# Patient Record
Sex: Male | Born: 1980 | Hispanic: Yes | Marital: Married | Smoking: Never smoker
Health system: Southern US, Community
[De-identification: ages and names within clinical notes are randomized; demographics above are authoritative.]

## PROBLEM LIST (undated history)

## (undated) DIAGNOSIS — R011 Cardiac murmur, unspecified: Secondary | ICD-10-CM

## (undated) DIAGNOSIS — R569 Unspecified convulsions: Secondary | ICD-10-CM

## (undated) DIAGNOSIS — F1010 Alcohol abuse, uncomplicated: Secondary | ICD-10-CM

## (undated) DIAGNOSIS — K7030 Alcoholic cirrhosis of liver without ascites: Secondary | ICD-10-CM

## (undated) HISTORY — PX: HAND SURGERY: SHX662

---

## 2008-01-06 ENCOUNTER — Ambulatory Visit: Payer: Self-pay | Admitting: Family Medicine

## 2008-01-06 ENCOUNTER — Ambulatory Visit: Payer: Self-pay

## 2013-07-05 ENCOUNTER — Emergency Department: Payer: Self-pay | Admitting: Emergency Medicine

## 2013-07-05 LAB — COMPREHENSIVE METABOLIC PANEL
ALK PHOS: 110 U/L
Albumin: 4 g/dL (ref 3.4–5.0)
Anion Gap: 6 — ABNORMAL LOW (ref 7–16)
BUN: 5 mg/dL — ABNORMAL LOW (ref 7–18)
Bilirubin,Total: 0.4 mg/dL (ref 0.2–1.0)
Calcium, Total: 9 mg/dL (ref 8.5–10.1)
Chloride: 109 mmol/L — ABNORMAL HIGH (ref 98–107)
Co2: 27 mmol/L (ref 21–32)
Creatinine: 0.6 mg/dL (ref 0.60–1.30)
EGFR (African American): >60
GLUCOSE: 87 mg/dL (ref 65–99)
Osmolality: 280 (ref 275–301)
POTASSIUM: 3.5 mmol/L (ref 3.5–5.1)
SGOT(AST): 120 U/L — ABNORMAL HIGH (ref 15–37)
SGPT (ALT): 96 U/L — ABNORMAL HIGH (ref 12–78)
SODIUM: 142 mmol/L (ref 136–145)
Total Protein: 8.5 g/dL — ABNORMAL HIGH (ref 6.4–8.2)

## 2013-07-05 LAB — PROTIME-INR
INR: 1
PROTHROMBIN TIME: 13.5 s (ref 11.5–14.7)

## 2013-07-05 LAB — CBC WITH DIFFERENTIAL/PLATELET
BASOS PCT: 0.8 %
Basophil #: 0 10*3/uL (ref 0.0–0.1)
EOS ABS: 0 10*3/uL (ref 0.0–0.7)
Eosinophil %: 0.5 %
HCT: 49 % (ref 40.0–52.0)
HGB: 16.2 g/dL (ref 13.0–18.0)
LYMPHS ABS: 1.8 10*3/uL (ref 1.0–3.6)
LYMPHS PCT: 32.3 %
MCH: 31.1 pg (ref 26.0–34.0)
MCHC: 33.1 g/dL (ref 32.0–36.0)
MCV: 94 fL (ref 80–100)
MONO ABS: 0.7 x10 3/mm (ref 0.2–1.0)
Monocyte %: 12.9 %
Neutrophil #: 2.9 10*3/uL (ref 1.4–6.5)
Neutrophil %: 53.5 %
Platelet: 122 10*3/uL — ABNORMAL LOW (ref 150–440)
RBC: 5.21 10*6/uL (ref 4.40–5.90)
RDW: 13.5 % (ref 11.5–14.5)
WBC: 5.5 10*3/uL (ref 3.8–10.6)

## 2013-07-05 LAB — LIPASE, BLOOD: LIPASE: 206 U/L (ref 73–393)

## 2013-07-06 LAB — URINALYSIS, COMPLETE
Bacteria: NONE SEEN
Bilirubin,UR: NEGATIVE
Blood: NEGATIVE
GLUCOSE, UR: NEGATIVE mg/dL (ref 0–75)
Ketone: NEGATIVE
Leukocyte Esterase: NEGATIVE
Nitrite: NEGATIVE
Ph: 6 (ref 4.5–8.0)
Protein: NEGATIVE
RBC,UR: 1 /HPF (ref 0–5)
Specific Gravity: 1.025 (ref 1.003–1.030)
Squamous Epithelial: NONE SEEN
WBC UR: 1 /HPF (ref 0–5)

## 2013-07-06 LAB — CBC
HCT: 44.2 % (ref 40.0–52.0)
HGB: 14.6 g/dL (ref 13.0–18.0)
MCH: 31 pg (ref 26.0–34.0)
MCHC: 33 g/dL (ref 32.0–36.0)
MCV: 94 fL (ref 80–100)
Platelet: 104 10*3/uL — ABNORMAL LOW (ref 150–440)
RBC: 4.71 10*6/uL (ref 4.40–5.90)
RDW: 13.8 % (ref 11.5–14.5)
WBC: 5.1 10*3/uL (ref 3.8–10.6)

## 2013-07-06 LAB — ETHANOL
Ethanol %: 0.248 % — ABNORMAL HIGH (ref 0.000–0.080)
Ethanol: 248 mg/dL

## 2013-11-11 ENCOUNTER — Emergency Department: Payer: Self-pay | Admitting: Emergency Medicine

## 2013-11-11 LAB — COMPREHENSIVE METABOLIC PANEL
ALBUMIN: 3.7 g/dL (ref 3.4–5.0)
ALK PHOS: 97 U/L
AST: 208 U/L — AB (ref 15–37)
Anion Gap: 8 (ref 7–16)
BILIRUBIN TOTAL: 0.4 mg/dL (ref 0.2–1.0)
BUN: 4 mg/dL — ABNORMAL LOW (ref 7–18)
Calcium, Total: 8.2 mg/dL — ABNORMAL LOW (ref 8.5–10.1)
Chloride: 111 mmol/L — ABNORMAL HIGH (ref 98–107)
Co2: 26 mmol/L (ref 21–32)
Creatinine: 0.55 mg/dL — ABNORMAL LOW (ref 0.60–1.30)
EGFR (African American): >60
EGFR (Non-African Amer.): >60
Glucose: 96 mg/dL (ref 65–99)
Osmolality: 285 (ref 275–301)
Potassium: 3.5 mmol/L (ref 3.5–5.1)
SGPT (ALT): 121 U/L — ABNORMAL HIGH
Sodium: 145 mmol/L (ref 136–145)
Total Protein: 8.1 g/dL (ref 6.4–8.2)

## 2013-11-11 LAB — URINALYSIS, COMPLETE
BILIRUBIN, UR: NEGATIVE
Bacteria: NONE SEEN
Blood: NEGATIVE
Glucose,UR: NEGATIVE mg/dL (ref 0–75)
KETONE: NEGATIVE
LEUKOCYTE ESTERASE: NEGATIVE
Nitrite: NEGATIVE
Ph: 7 (ref 4.5–8.0)
Protein: NEGATIVE
RBC,UR: NONE SEEN /HPF (ref 0–5)
Specific Gravity: 1.001 (ref 1.003–1.030)
Squamous Epithelial: NONE SEEN
WBC UR: NONE SEEN /HPF (ref 0–5)

## 2013-11-11 LAB — CBC
HCT: 46.2 % (ref 40.0–52.0)
HGB: 15.2 g/dL (ref 13.0–18.0)
MCH: 31.5 pg (ref 26.0–34.0)
MCHC: 32.8 g/dL (ref 32.0–36.0)
MCV: 96 fL (ref 80–100)
Platelet: 141 10*3/uL — ABNORMAL LOW (ref 150–440)
RBC: 4.81 10*6/uL (ref 4.40–5.90)
RDW: 13.6 % (ref 11.5–14.5)
WBC: 5.5 10*3/uL (ref 3.8–10.6)

## 2013-11-11 LAB — DRUG SCREEN, URINE
Amphetamines, Ur Screen: NEGATIVE (ref ?–1000)
BENZODIAZEPINE, UR SCRN: NEGATIVE (ref ?–200)
Barbiturates, Ur Screen: NEGATIVE (ref ?–200)
CANNABINOID 50 NG, UR ~~LOC~~: NEGATIVE (ref ?–50)
COCAINE METABOLITE, UR ~~LOC~~: NEGATIVE (ref ?–300)
MDMA (Ecstasy)Ur Screen: NEGATIVE (ref ?–500)
Methadone, Ur Screen: NEGATIVE (ref ?–300)
Opiate, Ur Screen: NEGATIVE (ref ?–300)
PHENCYCLIDINE (PCP) UR S: NEGATIVE (ref ?–25)
TRICYCLIC, UR SCREEN: NEGATIVE (ref ?–1000)

## 2013-11-11 LAB — ETHANOL
ETHANOL LVL: 397 mg/dL — AB
Ethanol %: 0.397 % (ref 0.000–0.080)

## 2013-11-11 LAB — ACETAMINOPHEN LEVEL

## 2013-11-11 LAB — SALICYLATE LEVEL: Salicylates, Serum: <1.7 mg/dL

## 2015-02-03 ENCOUNTER — Encounter: Payer: Self-pay | Admitting: Emergency Medicine

## 2015-02-03 ENCOUNTER — Emergency Department
Admission: EM | Admit: 2015-02-03 | Discharge: 2015-02-03 | Disposition: A | Discharge status: Home / Self Care | Payer: Self-pay | Attending: Student | Admitting: Student

## 2015-02-03 DIAGNOSIS — S5011XA Contusion of right forearm, initial encounter: Secondary | ICD-10-CM | POA: Insufficient documentation

## 2015-02-03 DIAGNOSIS — F10920 Alcohol use, unspecified with intoxication, uncomplicated: Secondary | ICD-10-CM

## 2015-02-03 DIAGNOSIS — Y92410 Unspecified street and highway as the place of occurrence of the external cause: Secondary | ICD-10-CM | POA: Insufficient documentation

## 2015-02-03 DIAGNOSIS — F10120 Alcohol abuse with intoxication, uncomplicated: Secondary | ICD-10-CM | POA: Insufficient documentation

## 2015-02-03 DIAGNOSIS — Y9389 Activity, other specified: Secondary | ICD-10-CM | POA: Insufficient documentation

## 2015-02-03 DIAGNOSIS — Y998 Other external cause status: Secondary | ICD-10-CM | POA: Insufficient documentation

## 2015-02-03 HISTORY — DX: Cardiac murmur, unspecified: R01.1

## 2015-02-03 LAB — URINALYSIS COMPLETE WITH MICROSCOPIC (ARMC ONLY)
Bilirubin Urine: NEGATIVE
Glucose, UA: NEGATIVE mg/dL
HGB URINE DIPSTICK: NEGATIVE
Ketones, ur: NEGATIVE mg/dL
LEUKOCYTES UA: NEGATIVE
Nitrite: NEGATIVE
PH: 7 (ref 5.0–8.0)
Protein, ur: NEGATIVE mg/dL
SQUAMOUS EPITHELIAL / LPF: NONE SEEN
Specific Gravity, Urine: 1.01 (ref 1.005–1.030)

## 2015-02-03 LAB — COMPREHENSIVE METABOLIC PANEL
ALT: 53 U/L (ref 17–63)
AST: 96 U/L — AB (ref 15–41)
Albumin: 4.2 g/dL (ref 3.5–5.0)
Alkaline Phosphatase: 81 U/L (ref 38–126)
Anion gap: 12 (ref 5–15)
CHLORIDE: 107 mmol/L (ref 101–111)
CO2: 25 mmol/L (ref 22–32)
CREATININE: 0.48 mg/dL — AB (ref 0.61–1.24)
Calcium: 8.7 mg/dL — ABNORMAL LOW (ref 8.9–10.3)
GFR calc Af Amer: >60 mL/min (ref >60)
Glucose, Bld: 99 mg/dL (ref 65–99)
POTASSIUM: 3.5 mmol/L (ref 3.5–5.1)
SODIUM: 144 mmol/L (ref 135–145)
Total Bilirubin: 0.4 mg/dL (ref 0.3–1.2)
Total Protein: 7.7 g/dL (ref 6.5–8.1)

## 2015-02-03 LAB — CBC
HEMATOCRIT: 43.6 % (ref 40.0–52.0)
Hemoglobin: 15.2 g/dL (ref 13.0–18.0)
MCH: 32.9 pg (ref 26.0–34.0)
MCHC: 34.8 g/dL (ref 32.0–36.0)
MCV: 94.3 fL (ref 80.0–100.0)
PLATELETS: 162 10*3/uL (ref 150–440)
RBC: 4.62 MIL/uL (ref 4.40–5.90)
RDW: 13.8 % (ref 11.5–14.5)
WBC: 5 10*3/uL (ref 3.8–10.6)

## 2015-02-03 LAB — ETHANOL: Alcohol, Ethyl (B): 383 mg/dL (ref <5)

## 2015-02-03 LAB — LIPASE, BLOOD: LIPASE: 22 U/L (ref 11–51)
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
## 2016-07-17 ENCOUNTER — Emergency Department
Admission: EM | Admit: 2016-07-17 | Discharge: 2016-07-18 | Disposition: A | Discharge status: Home / Self Care | Payer: Self-pay | Attending: Emergency Medicine | Admitting: Emergency Medicine

## 2016-07-17 ENCOUNTER — Encounter: Payer: Self-pay | Admitting: Emergency Medicine

## 2016-07-17 ENCOUNTER — Emergency Department: Payer: Self-pay

## 2016-07-17 DIAGNOSIS — Y999 Unspecified external cause status: Secondary | ICD-10-CM | POA: Insufficient documentation

## 2016-07-17 DIAGNOSIS — W1839XA Other fall on same level, initial encounter: Secondary | ICD-10-CM | POA: Insufficient documentation

## 2016-07-17 DIAGNOSIS — G5632 Lesion of radial nerve, left upper limb: Secondary | ICD-10-CM | POA: Insufficient documentation

## 2016-07-17 DIAGNOSIS — S63072A Subluxation of distal end of left ulna, initial encounter: Secondary | ICD-10-CM | POA: Insufficient documentation

## 2016-07-17 DIAGNOSIS — Y929 Unspecified place or not applicable: Secondary | ICD-10-CM | POA: Insufficient documentation

## 2016-07-17 DIAGNOSIS — Y939 Activity, unspecified: Secondary | ICD-10-CM | POA: Insufficient documentation

## 2016-07-17 NOTE — ED Triage Notes (Signed)
Pt presents to ED with left ankle pain and left wrist forearm numbness. Pt reports on Thursday pt was intoxicated and fell from a standing position to the concrete. Pt landed on his left shoulder and left leg. Worsening pain since incident. Pt ambulatory to triage limping slightly. Appears slightly anxious. Denies hitting his head.

## 2016-07-18 NOTE — Discharge Instructions (Signed)
We believe your nerve injury was due to the awkward position you were in after your fall a week ago.  Please call the office of Dr. Rosita Kea in the morning and explain that Dr. Rosita Kea wants to see you in clinic today for further evaluation of your nerve damage.  Please keep your wrist splint in place.

## 2016-07-18 NOTE — ED Provider Notes (Signed)
[facility] Emergency Department Provider Note  ____________________________________________   First MD Initiated Contact with Patient 07/18/16 0018     (approximate)  I have reviewed the triage vital signs and the nursing notes.   HISTORY  Chief Complaint Ankle Pain and Numbness    HPI Brett Washington is a 36 y.o. male with no significant chronic medical history who presents for evaluation of pain in his left ankle and numbness and difficulty moving his left wrist since last week.  He reports that nearly a full week ago he was intoxicated and fell from a standing position onto the concrete landing on his left shoulder and his left leg.  His ankle has been tender and it is worse with movement and weightbearing.  He states that he has some pain in his left ankle but it is mild and he is able to ambulate.  He is most concerned about the inability to extend his left wrist and the numbness in his hand.  His wife was able to provide more information about the fall.  Reportedly the patient was intoxicated and fell between a couch and coffee table and was stuck in an awkward position with his left arm extended and compressed for an unknown period of time.  He has had persistent neurological impingement for nearly a week now.  He is ambulatory and states that he does not really have pain, just decreased sensation and decreased ability to move the left wrist and hand.  He denies chest pain, shortness of breath, nausea, vomiting, difficulty with ambulation.  He has no facial droop, no headache, no neck pain, no chest pain, shortness of breath, no abdominal pain.   Past Medical History:  Diagnosis Date  . Heart murmur     There are no active problems to display for this patient.   Past Surgical History:  Procedure Laterality Date  . HAND SURGERY      Prior to Admission medications   Not on File    Allergies Patient has no known allergies.  No family history  on file.  Social History Social History  Substance Use Topics  . Smoking status: Never Smoker  . Smokeless tobacco: Never Used  . Alcohol use Yes    Review of Systems Constitutional: No fever/chills Eyes: No visual changes. ENT: No sore throat. Cardiovascular: Denies chest pain. Respiratory: Denies shortness of breath. Gastrointestinal: No abdominal pain.  No nausea, no vomiting.  No diarrhea.  No constipation. Genitourinary: Negative for dysuria. Musculoskeletal: Mild Pain in left ankle. Skin: Negative for rash. Neurological: Inability to extend the left wrist, decreased Sensation in his left hand  10-point ROS otherwise negative.  ____________________________________________   PHYSICAL EXAM:  VITAL SIGNS: ED Triage Vitals  Enc Vitals Group     BP 07/17/16 2223 (!) 145/95     Pulse Rate 07/17/16 2223 (!) 117     Resp 07/17/16 2223 18     Temp 07/17/16 2223 97.9 F (36.6 C)     Temp Source 07/17/16 2223 Oral     SpO2 07/17/16 2223 96 %     Weight 07/17/16 2224 209 lb (94.8 kg)     Height 07/17/16 2224  (1.676 m)     Head Circumference --      Peak Flow --      Pain Score 07/17/16 2226 9     Pain Loc --      Pain Edu? --      Excl. in GC? --  Constitutional: Alert and oriented. Well appearing and in no acute distress. Eyes: Conjunctivae are normal. PERRL. EOMI. Head: Atraumatic. Nose: No congestion/rhinnorhea. Mouth/Throat: Mucous membranes are moist. Neck: No stridor.  No meningeal signs.  No cervical spine tenderness to palpation. Cardiovascular: Normal rate, regular rhythm. Good peripheral circulation. Grossly normal heart sounds. Respiratory: Normal respiratory effort.  No retractions. Lungs CTAB. Gastrointestinal: Soft and nontender. No distention.  Musculoskeletal: Gross abnormalities of his extremities.  See neurologic exam for further details.  Full range of motion of shoulder and left elbow.  No tenderness to palpation except at the distal  wrist. Neurologic:  Normal speech and language. Decreased sensation on the dorsum of the left hand.  Inability to extend his left wrist at all.  Decreased grip strength throughout the left hand. Skin:  Skin is warm, dry and intact. No rash noted. Psychiatric: Mood and affect are normal. Speech and behavior are normal.  ____________________________________________   LABS (all labs ordered are listed, but only abnormal results are displayed)  Labs Reviewed - No data to display ____________________________________________  EKG  None - EKG not ordered by ED physician ____________________________________________  RADIOLOGY Marylou Mccoy, personally viewed and evaluated these images (plain radiographs) as part of my medical decision making, as well as reviewing the written report by the radiologist.  Dg Wrist Complete Left  Result Date: 07/17/2016 CLINICAL DATA:  Generalized left wrist pain after fall EXAM: LEFT WRIST - COMPLETE 3+ VIEW COMPARISON:  None. FINDINGS: No acute fracture. Carpal rows are maintained. The distal ulna is slightly more dorsal in appearance relative to the radius with slight widened appearance of the distal radioulnar joint. Findings may represent mild subluxation of the distal ulna. Tiny rounded punctate ossification adjacent to the tip of the ulnar styloid may reflect old remote trauma, capsular or ligamentous calcification. IMPRESSION: Slight dorsal subluxation of the distal ulna relative to the radius at the distal radioulnar joint. No acute osseous appearing abnormality. Electronically Signed   By: Tollie Eth M.D.   On: 07/17/2016 23:28   Dg Ankle Complete Left  Result Date: 07/17/2016 CLINICAL DATA:  Patient fell 6 days ago with medial left ankle pain. EXAM: LEFT ANKLE COMPLETE - 3+ VIEW COMPARISON:  None. FINDINGS: There is no evidence of fracture, dislocation, or joint effusion. There is no evidence of arthropathy or other focal bone abnormality. Small dorsal  calcaneal enthesophyte. Soft tissues are unremarkable. IMPRESSION: No acute osseous abnormality of the left ankle. Electronically Signed   By: Tollie Eth M.D.   On: 07/17/2016 23:24    ____________________________________________   PROCEDURES  Critical Care performed: No   Procedure(s) performed:   Procedures   ____________________________________________   INITIAL IMPRESSION / ASSESSMENT AND PLAN / ED COURSE  Pertinent labs & imaging results that were available during my care of the patient were reviewed by me and considered in my medical decision making (see chart for details).  Very concerning decreased neurological exam with profound radial nerve palsy of the left arm.  Will consult Dr. Rosita Kea.   Clinical Course as of Jul 18 140  Thu Jul 18, 2016  0038 Spoke by phone with Dr. Rosita Kea.  Explained neurological deficits and radiographic findings.  He recommended Velcro wrist splint and close follow up later today in clinic for nerve testing and additional evaluation and treatment.  Will update patient and family.  [CF]    Clinical Course User Index [CF] Loleta Rose, MD    ____________________________________________  FINAL CLINICAL IMPRESSION(S) / ED DIAGNOSES  Final diagnoses:  Radial nerve palsy, left  Subluxation of distal end of left ulna, initial encounter     MEDICATIONS GIVEN DURING THIS VISIT:  Medications - No data to display   NEW OUTPATIENT MEDICATIONS STARTED DURING THIS VISIT:  New Prescriptions   No medications on file    Modified Medications   No medications on file    Discontinued Medications   No medications on file     Note:  This document was prepared using Dragon voice recognition software and may include unintentional dictation errors.    Loleta Rose, MD 07/18/16 [phone]

## 2016-07-18 NOTE — ED Notes (Signed)
Gave pt orange juice 

## 2016-10-31 ENCOUNTER — Inpatient Hospital Stay
Admission: EM | Admit: 2016-10-31 | Discharge: 2016-11-11 | DRG: 896 | Disposition: A | Discharge status: Home / Self Care | Payer: Self-pay | Attending: Internal Medicine | Admitting: Internal Medicine

## 2016-10-31 ENCOUNTER — Encounter: Payer: Self-pay | Admitting: Emergency Medicine

## 2016-10-31 ENCOUNTER — Emergency Department: Payer: Self-pay

## 2016-10-31 DIAGNOSIS — R740 Nonspecific elevation of levels of transaminase and lactic acid dehydrogenase [LDH]: Secondary | ICD-10-CM

## 2016-10-31 DIAGNOSIS — K5229 Other allergic and dietetic gastroenteritis and colitis: Secondary | ICD-10-CM | POA: Diagnosis not present

## 2016-10-31 DIAGNOSIS — A419 Sepsis, unspecified organism: Secondary | ICD-10-CM | POA: Diagnosis not present

## 2016-10-31 DIAGNOSIS — K7010 Alcoholic hepatitis without ascites: Secondary | ICD-10-CM | POA: Diagnosis present

## 2016-10-31 DIAGNOSIS — R0600 Dyspnea, unspecified: Secondary | ICD-10-CM

## 2016-10-31 DIAGNOSIS — D6959 Other secondary thrombocytopenia: Secondary | ICD-10-CM | POA: Diagnosis present

## 2016-10-31 DIAGNOSIS — R569 Unspecified convulsions: Secondary | ICD-10-CM | POA: Diagnosis present

## 2016-10-31 DIAGNOSIS — F10231 Alcohol dependence with withdrawal delirium: Principal | ICD-10-CM | POA: Diagnosis present

## 2016-10-31 DIAGNOSIS — F10939 Alcohol use, unspecified with withdrawal, unspecified: Secondary | ICD-10-CM

## 2016-10-31 DIAGNOSIS — D696 Thrombocytopenia, unspecified: Secondary | ICD-10-CM | POA: Diagnosis present

## 2016-10-31 DIAGNOSIS — Z452 Encounter for adjustment and management of vascular access device: Secondary | ICD-10-CM

## 2016-10-31 DIAGNOSIS — Z0189 Encounter for other specified special examinations: Secondary | ICD-10-CM

## 2016-10-31 DIAGNOSIS — G5630 Lesion of radial nerve, unspecified upper limb: Secondary | ICD-10-CM | POA: Diagnosis not present

## 2016-10-31 DIAGNOSIS — J690 Pneumonitis due to inhalation of food and vomit: Secondary | ICD-10-CM | POA: Diagnosis not present

## 2016-10-31 DIAGNOSIS — G9341 Metabolic encephalopathy: Secondary | ICD-10-CM | POA: Diagnosis present

## 2016-10-31 DIAGNOSIS — K760 Fatty (change of) liver, not elsewhere classified: Secondary | ICD-10-CM | POA: Diagnosis present

## 2016-10-31 DIAGNOSIS — E876 Hypokalemia: Secondary | ICD-10-CM | POA: Diagnosis present

## 2016-10-31 DIAGNOSIS — E878 Other disorders of electrolyte and fluid balance, not elsewhere classified: Secondary | ICD-10-CM | POA: Diagnosis not present

## 2016-10-31 DIAGNOSIS — W19XXXA Unspecified fall, initial encounter: Secondary | ICD-10-CM

## 2016-10-31 DIAGNOSIS — J029 Acute pharyngitis, unspecified: Secondary | ICD-10-CM | POA: Diagnosis not present

## 2016-10-31 DIAGNOSIS — F1020 Alcohol dependence, uncomplicated: Secondary | ICD-10-CM | POA: Diagnosis present

## 2016-10-31 DIAGNOSIS — F10239 Alcohol dependence with withdrawal, unspecified: Secondary | ICD-10-CM

## 2016-10-31 DIAGNOSIS — R410 Disorientation, unspecified: Secondary | ICD-10-CM

## 2016-10-31 DIAGNOSIS — F10230 Alcohol dependence with withdrawal, uncomplicated: Secondary | ICD-10-CM

## 2016-10-31 DIAGNOSIS — R7401 Elevation of levels of liver transaminase levels: Secondary | ICD-10-CM

## 2016-10-31 DIAGNOSIS — E870 Hyperosmolality and hypernatremia: Secondary | ICD-10-CM | POA: Diagnosis not present

## 2016-10-31 HISTORY — DX: Unspecified convulsions: R56.9

## 2016-10-31 HISTORY — DX: Alcohol abuse, uncomplicated: F10.10

## 2016-10-31 LAB — BASIC METABOLIC PANEL
Anion gap: 20 — ABNORMAL HIGH (ref 5–15)
BUN: <5 mg/dL — ABNORMAL LOW (ref 6–20)
CO2: 23 mmol/L (ref 22–32)
CREATININE: 0.73 mg/dL (ref 0.61–1.24)
Calcium: 9.3 mg/dL (ref 8.9–10.3)
Chloride: 91 mmol/L — ABNORMAL LOW (ref 101–111)
GFR calc non Af Amer: >60 mL/min (ref >60)
Glucose, Bld: 140 mg/dL — ABNORMAL HIGH (ref 65–99)
POTASSIUM: 2.6 mmol/L — AB (ref 3.5–5.1)
SODIUM: 134 mmol/L — AB (ref 135–145)

## 2016-10-31 LAB — HEPATIC FUNCTION PANEL
ALBUMIN: 2.9 g/dL — AB (ref 3.5–5.0)
ALT: 127 U/L — AB (ref 17–63)
AST: 391 U/L — AB (ref 15–41)
Alkaline Phosphatase: 162 U/L — ABNORMAL HIGH (ref 38–126)
BILIRUBIN DIRECT: 4.4 mg/dL — AB (ref 0.1–0.5)
Indirect Bilirubin: 4.2 mg/dL — ABNORMAL HIGH (ref 0.3–0.9)
Total Bilirubin: 8.6 mg/dL — ABNORMAL HIGH (ref 0.3–1.2)
Total Protein: 7.1 g/dL (ref 6.5–8.1)

## 2016-10-31 LAB — URINE DRUG SCREEN, QUALITATIVE (ARMC ONLY)
Amphetamines, Ur Screen: NOT DETECTED
BARBITURATES, UR SCREEN: NOT DETECTED
Benzodiazepine, Ur Scrn: NOT DETECTED
CANNABINOID 50 NG, UR ~~LOC~~: NOT DETECTED
COCAINE METABOLITE, UR ~~LOC~~: NOT DETECTED
MDMA (ECSTASY) UR SCREEN: NOT DETECTED
Methadone Scn, Ur: NOT DETECTED
Opiate, Ur Screen: NOT DETECTED
PHENCYCLIDINE (PCP) UR S: NOT DETECTED
Tricyclic, Ur Screen: NOT DETECTED

## 2016-10-31 LAB — ETHANOL: Alcohol, Ethyl (B): 6 mg/dL — ABNORMAL HIGH (ref <5)

## 2016-10-31 LAB — URINALYSIS, COMPLETE (UACMP) WITH MICROSCOPIC
BACTERIA UA: NONE SEEN
GLUCOSE, UA: NEGATIVE mg/dL
HGB URINE DIPSTICK: NEGATIVE
Ketones, ur: 5 mg/dL — AB
Leukocytes, UA: NEGATIVE
NITRITE: NEGATIVE
PH: 7 (ref 5.0–8.0)
Protein, ur: 100 mg/dL — AB
SPECIFIC GRAVITY, URINE: 1.025 (ref 1.005–1.030)

## 2016-10-31 LAB — CBC
HCT: 39.5 % — ABNORMAL LOW (ref 40.0–52.0)
Hemoglobin: 13.4 g/dL (ref 13.0–18.0)
MCH: 32.8 pg (ref 26.0–34.0)
MCHC: 34.1 g/dL (ref 32.0–36.0)
MCV: 96.2 fL (ref 80.0–100.0)
PLATELETS: 87 10*3/uL — AB (ref 150–440)
RBC: 4.1 MIL/uL — AB (ref 4.40–5.90)
RDW: 15.7 % — ABNORMAL HIGH (ref 11.5–14.5)
WBC: 4.7 10*3/uL (ref 3.8–10.6)

## 2016-10-31 LAB — BLOOD GAS, VENOUS
ACID-BASE EXCESS: 5.5 mmol/L — AB (ref 0.0–2.0)
BICARBONATE: 30.6 mmol/L — AB (ref 20.0–28.0)
O2 SAT: 85.8 %
PH VEN: 7.44 — AB (ref 7.250–7.430)
Patient temperature: 37
pCO2, Ven: 45 mmHg (ref 44.0–60.0)
pO2, Ven: 49 mmHg — ABNORMAL HIGH (ref 32.0–45.0)

## 2016-10-31 LAB — TROPONIN I: Troponin I: <0.03 ng/mL (ref <0.03)

## 2016-10-31 LAB — LACTIC ACID, PLASMA
Lactic Acid, Venous: 5.4 mmol/L (ref 0.5–1.9)
Lactic Acid, Venous: 2 mmol/L (ref 0.5–1.9)
Lactic Acid, Venous: 1.5 mmol/L (ref 0.5–1.9)

## 2016-10-31 LAB — GLUCOSE, CAPILLARY: GLUCOSE-CAPILLARY: 141 mg/dL — AB (ref 65–99)

## 2016-10-31 MED ORDER — IBUPROFEN 400 MG PO TABS
400.0000 mg | ORAL_TABLET | Freq: Four times a day (QID) | ORAL | Status: DC | PRN
  Administered 2016-11-04 – 2016-11-08 (×2): 400 mg via ORAL
  Filled 2016-10-31 (×2): qty 1

## 2016-10-31 MED ORDER — LORAZEPAM 2 MG/ML IJ SOLN: 0.0000 mg | Freq: Two times a day (BID) | INTRAMUSCULAR | Status: DC

## 2016-10-31 MED ORDER — LORAZEPAM 1 MG PO TABS: 1.0000 mg | ORAL_TABLET | Freq: Four times a day (QID) | ORAL | Status: DC | PRN

## 2016-10-31 MED ORDER — ONDANSETRON HCL 4 MG/2ML IJ SOLN
INTRAMUSCULAR | Status: AC
  Administered 2016-10-31: 4 mg via INTRAVENOUS
  Filled 2016-10-31: qty 2

## 2016-10-31 MED ORDER — LORAZEPAM 2 MG/ML IJ SOLN
INTRAMUSCULAR | Status: AC
  Filled 2016-10-31: qty 1

## 2016-10-31 MED ORDER — POTASSIUM CHLORIDE CRYS ER 20 MEQ PO TBCR
40.0000 meq | EXTENDED_RELEASE_TABLET | Freq: Once | ORAL | Status: DC
  Filled 2016-10-31: qty 2

## 2016-10-31 MED ORDER — ADULT MULTIVITAMIN W/MINERALS CH
1.0000 | ORAL_TABLET | Freq: Every day | ORAL | Status: DC
  Administered 2016-11-01 – 2016-11-04 (×4): 1 via ORAL
  Filled 2016-10-31 (×5): qty 1

## 2016-10-31 MED ORDER — VITAMIN B-1 100 MG PO TABS
100.0000 mg | ORAL_TABLET | Freq: Every day | ORAL | Status: DC
  Administered 2016-11-01 – 2016-11-04 (×3): 100 mg via ORAL
  Filled 2016-10-31 (×5): qty 1

## 2016-10-31 MED ORDER — ONDANSETRON HCL 4 MG PO TABS: 4.0000 mg | ORAL_TABLET | Freq: Four times a day (QID) | ORAL | Status: DC | PRN

## 2016-10-31 MED ORDER — LORAZEPAM 2 MG/ML IJ SOLN
1.0000 mg | Freq: Once | INTRAMUSCULAR | Status: AC
  Administered 2016-10-31: 1 mg via INTRAVENOUS

## 2016-10-31 MED ORDER — POTASSIUM CHLORIDE 10 MEQ/100ML IV SOLN
10.0000 meq | INTRAVENOUS | Status: AC
  Administered 2016-10-31: 10 meq via INTRAVENOUS
  Filled 2016-10-31 (×2): qty 100

## 2016-10-31 MED ORDER — ONDANSETRON HCL 4 MG/2ML IJ SOLN
4.0000 mg | Freq: Once | INTRAMUSCULAR | Status: AC
  Administered 2016-10-31: 4 mg via INTRAVENOUS

## 2016-10-31 MED ORDER — LORAZEPAM 2 MG/ML IJ SOLN
0.0000 mg | Freq: Four times a day (QID) | INTRAMUSCULAR | Status: DC
  Administered 2016-10-31 (×2): 2 mg via INTRAVENOUS
  Administered 2016-11-01 (×2): 1 mg via INTRAVENOUS
  Filled 2016-10-31 (×4): qty 1

## 2016-10-31 MED ORDER — SODIUM CHLORIDE 0.9 % IV SOLN
INTRAVENOUS | Status: AC
  Administered 2016-10-31 – 2016-11-01 (×2): via INTRAVENOUS

## 2016-10-31 MED ORDER — SODIUM CHLORIDE 0.9 % IV BOLUS (SEPSIS)
1000.0000 mL | Freq: Once | INTRAVENOUS | Status: AC
  Administered 2016-10-31: 1000 mL via INTRAVENOUS

## 2016-10-31 MED ORDER — POTASSIUM CHLORIDE 10 MEQ/100ML IV SOLN
10.0000 meq | INTRAVENOUS | Status: DC
  Filled 2016-10-31 (×2): qty 100

## 2016-10-31 MED ORDER — THIAMINE HCL 100 MG/ML IJ SOLN
100.0000 mg | Freq: Every day | INTRAMUSCULAR | Status: DC
  Administered 2016-11-03: 100 mg via INTRAVENOUS
  Filled 2016-10-31: qty 1
  Filled 2016-10-31: qty 2
  Filled 2016-10-31: qty 1

## 2016-10-31 MED ORDER — LORAZEPAM 2 MG/ML IJ SOLN: 1.0000 mg | Freq: Four times a day (QID) | INTRAMUSCULAR | Status: DC | PRN

## 2016-10-31 MED ORDER — ONDANSETRON HCL 4 MG/2ML IJ SOLN
4.0000 mg | Freq: Four times a day (QID) | INTRAMUSCULAR | Status: DC | PRN
  Administered 2016-11-06: 4 mg via INTRAVENOUS
  Filled 2016-10-31: qty 2

## 2016-10-31 MED ORDER — FOLIC ACID 1 MG PO TABS
1.0000 mg | ORAL_TABLET | Freq: Every day | ORAL | Status: DC
  Administered 2016-11-01 – 2016-11-04 (×4): 1 mg via ORAL
  Filled 2016-10-31 (×5): qty 1

## 2016-10-31 NOTE — ED Notes (Signed)
Seizure pads placed on pt bed rails at this time.

## 2016-10-31 NOTE — ED Notes (Signed)
Pt states he drinks every other day aprox 6-8 beers, notable tremors, pt states " shaky until I have a drink".

## 2016-10-31 NOTE — H&P (Addendum)
[facility] - [place] [facility]   PATIENT NAME: Brett Washington    MR#:  [id number]  DATE OF BIRTH:  10/25/1980  DATE OF ADMISSION:  10/31/2016  PRIMARY CARE PHYSICIAN: [facility]   REQUESTING/REFERRING PHYSICIAN: Shaune PollackLord, MD  CHIEF COMPLAINT:   Chief Complaint  Patient presents with  . Seizures    HISTORY OF PRESENT ILLNESS:  Brett Washington  is a 36 y.o. male who presents with Seizure episode. Patient was working when he had his seizure. He states that he has had a seizure like this before due to alcohol withdrawal. He states that he drinks about 8 years daily. His alcohol level in the ED today is only 6. Hospitalists were called for admission for alcohol withdrawal. Of note, patient also has thrombocytopenia on evaluation here, as well as transaminitis.  PAST MEDICAL HISTORY:   Past Medical History:  Diagnosis Date  . Alcohol abuse   . Heart murmur   . Seizures (HCC)     PAST SURGICAL HISTORY:   Past Surgical History:  Procedure Laterality Date  . HAND SURGERY      SOCIAL HISTORY:   Social History  Substance Use Topics  . Smoking status: Never Smoker  . Smokeless tobacco: Never Used  . Alcohol use 33.6 oz/week    56 Cans of beer per week    FAMILY HISTORY:   Family History  Problem Relation Age of Onset  . Family history unknown: Yes    DRUG ALLERGIES:  No Known Allergies  MEDICATIONS AT HOME:   Prior to Admission medications   Not on File    REVIEW OF SYSTEMS:  Review of Systems  Constitutional: Negative for chills, fever, malaise/fatigue and weight loss.  HENT: Negative for ear pain, hearing loss and tinnitus.   Eyes: Negative for blurred vision, double vision, pain and redness.  Respiratory: Negative for cough, hemoptysis and shortness of breath.   Cardiovascular: Negative for chest pain, palpitations, orthopnea and leg swelling.  Gastrointestinal: Negative for abdominal pain, constipation,  diarrhea, nausea and vomiting.  Genitourinary: Negative for dysuria, frequency and hematuria.  Musculoskeletal: Negative for back pain, joint pain and neck pain.  Skin:       No acne, rash, or lesions  Neurological: Positive for seizures. Negative for dizziness, tremors, focal weakness and weakness.  Endo/Heme/Allergies: Negative for polydipsia. Does not bruise/bleed easily.  Psychiatric/Behavioral: Negative for depression. The patient is not nervous/anxious and does not have insomnia.      VITAL SIGNS:   Vitals:   10/31/16 1227 10/31/16 1230 10/31/16 1315 10/31/16 1345  BP:  130/86  138/86  Pulse:  (!) 108 (!) 103 (!) 106  Resp:  (!) 24 (!) 25 (!) 21  Temp:      TempSrc:      SpO2:  97% 97% 98%  Weight: 86.2 kg (190 lb)     Height: 5\' 6"  (1.676 m)      Wt Readings from Last 3 Encounters:  10/31/16 86.2 kg (190 lb)  07/17/16 94.8 kg (209 lb)  02/03/15 101.2 kg (223 lb)    PHYSICAL EXAMINATION:  Physical Exam  Vitals reviewed. Constitutional: He is oriented to person, place, and time. He appears well-developed and well-nourished. No distress.  HENT:  Head: Normocephalic and atraumatic.  Dry mucous membranes  Eyes: Pupils are equal, round, and reactive to light. Conjunctivae and EOM are normal. No scleral icterus.  Neck: Normal range of motion. Neck supple. No JVD present. No thyromegaly present.  Cardiovascular: Normal rate, regular rhythm and intact distal pulses.  Exam reveals no gallop and no friction rub.   Murmur heard. Respiratory: Effort normal and breath sounds normal. No respiratory distress. He has no wheezes. He has no rales.  GI: Soft. Bowel sounds are normal. He exhibits no distension. There is no tenderness.  Musculoskeletal: Normal range of motion. He exhibits no edema.  No arthritis, no gout  Lymphadenopathy:    He has no cervical adenopathy.  Neurological: He is alert and oriented to person, place, and time. No cranial nerve deficit.  No dysarthria, no  aphasia  Skin: Skin is warm and dry. No rash noted. No erythema.  Psychiatric: He has a normal mood and affect. His behavior is normal. Judgment and thought content normal.    LABORATORY PANEL:   CBC  Recent Labs Lab 10/31/16 1234  WBC 4.7  HGB 13.4  HCT 39.5*  PLT 87*   ------------------------------------------------------------------------------------------------------------------  Chemistries   Recent Labs Lab 10/31/16 1234  NA 134*  K 2.6*  CL 91*  CO2 23  GLUCOSE 140*  BUN <5*  CREATININE 0.73  CALCIUM 9.3  AST 391*  ALT 127*  ALKPHOS 162*  BILITOT 8.6*   ------------------------------------------------------------------------------------------------------------------  Cardiac Enzymes  Recent Labs Lab 10/31/16 1234  TROPONINI <0.03   ------------------------------------------------------------------------------------------------------------------  RADIOLOGY:  Ct Head Wo Contrast  Result Date: 10/31/2016 CLINICAL DATA:  Seizure activity EXAM: CT HEAD WITHOUT CONTRAST TECHNIQUE: Contiguous axial images were obtained from the base of the skull through the vertex without intravenous contrast. COMPARISON:  07/06/2013 FINDINGS: Brain: No evidence of acute infarction, hemorrhage, hydrocephalus, extra-axial collection or mass lesion/mass effect. Vascular: No hyperdense vessel or unexpected calcification. Skull: Normal. Negative for fracture or focal lesion. Sinuses/Orbits: No acute finding. Other: None. IMPRESSION: No acute intracranial abnormality noted. Electronically Signed   By: Alcide Clever M.D.   On: 10/31/2016 13:19    EKG:   Orders placed or performed during the hospital encounter of 10/31/16  . ED EKG  . ED EKG  . EKG 12-Lead  . EKG 12-Lead    IMPRESSION AND PLAN:  Principal Problem:   Alcohol withdrawal seizure (HCC) - CIWA protocol. Seizure precautions. Patient states that he wants to quit drinking, social work consult with case management to  try and Link with support programs. 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 We will screen him with a right upper quadrant ultrasound   Hypokalemia - replace and monitor  All the records are reviewed and case discussed with ED provider. Management plans discussed with the patient and/or family.  DVT PROPHYLAXIS: Mechanical only  GI PROPHYLAXIS: None  ADMISSION STATUS: Inpatient  CODE STATUS: Full Code Status History    This patient does not have a recorded code status. Please follow your organizational policy for patients in this situation.      TOTAL TIME TAKING CARE OF THIS PATIENT: 45 minutes.   Kayline Sheer FIELDING 10/31/2016, 2:29 PM  [employer]  [phone]  CC: Primary care physician; [facility]  Note:  This document was prepared using Conservation officer, historic buildings and may include unintentional dictation errors.

## 2016-10-31 NOTE — ED Provider Notes (Addendum)
[facility] Emergency Department Provider Note ____________________________________________   I have reviewed the triage vital signs and the triage nursing note.  HISTORY  Chief Complaint Seizures   Historian Patient  HPI Brett Washington is a 36 y.o. male with history of one prior seizure?, several months ago? - Patient is a bit of a strain. He does give a history of alcohol abuse - drinks 6-8 beers every other day.  Last drink yesterday.  Feels tremulous now.  Operates a Chief Executive Officer.  Had a witnessed seizure at work, but no trauma noted. Patient did bite his tongue.  No prolonged post ictal period.  Reports no injury other than tongue bit.  Requests help to quit drinking alcohol.  Denies history of seizure medication or seizure workup that I can tell.    Past Medical History:  Diagnosis Date  . Heart murmur   . Seizures (HCC)     There are no active problems to display for this patient.   Past Surgical History:  Procedure Laterality Date  . HAND SURGERY      Prior to Admission medications   Not on File    No Known Allergies  No family history on file.  Social History Social History  Substance Use Topics  . Smoking status: Never Smoker  . Smokeless tobacco: Never Used  . Alcohol use Yes    Review of Systems  Constitutional: Negative for fever. Eyes: Negative for visual changes. ENT: Negative for sore throat. Cardiovascular: Negative for chest pain. Respiratory: Negative for shortness of breath. Gastrointestinal: Negative for abdominal pain, vomiting and diarrhea. Genitourinary: Negative for dysuria. Musculoskeletal: Negative for back pain. Skin: Negative for rash. Neurological: Negative for headache.  Positive for tremors.  ____________________________________________   PHYSICAL EXAM:  VITAL SIGNS: ED Triage Vitals  Enc Vitals Group     BP 10/31/16 1226 130/86     Pulse Rate 10/31/16 1226 (!) 110     Resp 10/31/16 1226 18      Temp 10/31/16 1226 98.2 F (36.8 C)     Temp Source 10/31/16 1226 Oral     SpO2 10/31/16 1226 96 %     Weight 10/31/16 1227 190 lb (86.2 kg)     Height 10/31/16 1227 5\' 6"  (1.676 m)     Head Circumference --      Peak Flow --      Pain Score 10/31/16 1226 0     Pain Loc --      Pain Edu? --      Excl. in GC? --      Constitutional: Alert and oriented. Well appearing and in no distress.  He is tremulous. HEENT   Head: Normocephalic and atraumatic.      Eyes: Conjunctivae are normal. Pupils equal and round.       Ears:         Nose: No congestion/rhinnorhea.   Mouth/Throat: Mucous membranes are moist.  Tongue contusion at the tip.   Neck: No stridor. Cardiovascular/Chest: Normal rate, regular rhythm.  No murmurs, rubs, or gallops. Respiratory: Normal respiratory effort without tachypnea nor retractions. Breath sounds are clear and equal bilaterally. No wheezes/rales/rhonchi. Gastrointestinal: Soft. No distention, no guarding, no rebound. Nontender.    Genitourinary/rectal:Deferred Musculoskeletal: Nontender with normal range of motion in all extremities. No joint effusions.  No lower extremity tenderness.  No edema. Neurologic:  Normal speech and language. No gross or focal neurologic deficits are appreciated. Skin:  Skin is warm, dry and intact. No rash noted. Psychiatric:  No agitation. No suicidal or homicidal ideation..   ____________________________________________  LABS (pertinent positives/negatives)  Labs Reviewed  BASIC METABOLIC PANEL - Abnormal; Notable for the following:       Result Value   Sodium 134 (*)    Potassium 2.6 (*)    Chloride 91 (*)    Glucose, Bld 140 (*)    BUN <5 (*)    Anion gap 20 (*)    All other components within normal limits  CBC - Abnormal; Notable for the following:    RBC 4.10 (*)    HCT 39.5 (*)    RDW 15.7 (*)    Platelets 87 (*)    All other components within normal limits  ETHANOL - Abnormal; Notable for the  following:    Alcohol, Ethyl (B) 6 (*)    All other components within normal limits  GLUCOSE, CAPILLARY - Abnormal; Notable for the following:    Glucose-Capillary 141 (*)    All other components within normal limits  HEPATIC FUNCTION PANEL  TROPONIN I  URINALYSIS, COMPLETE (UACMP) WITH MICROSCOPIC  URINE DRUG SCREEN, QUALITATIVE ([facility] ONLY)  LACTIC ACID, PLASMA  LACTIC ACID, PLASMA  BLOOD GAS, VENOUS  CBG MONITORING, ED    ____________________________________________    EKG I, Governor Rooks, MD, the attending physician have personally viewed and interpreted all ECGs.  107 bpm. Sinus tachycardia. Normal axis. Nonspecific intraventricular conduction delay. Nonspecific ST and T-wave. ____________________________________________  RADIOLOGY All Xrays were viewed by me. Imaging interpreted by Radiologist.  CT head without contrast:  IMPRESSION: No acute intracranial abnormality noted. __________________________________________  PROCEDURES  Procedure(s) performed: None  Critical Care performed: None  ____________________________________________   ED COURSE / ASSESSMENT AND PLAN  Pertinent labs & imaging results that were available during my care of the patient were reviewed by me and considered in my medical decision making (see chart for details).   By report it sounds like the patient probably did have a seizure today, and he did bite his tongue. Most likely cause I would suspect to be due to alcohol withdrawal. He is actively tremulous and although he reports 6-8 beers every other day, he reports that when he gets like this he will drink alcohol and feel better after a couple of beers.  I'm going to give him Ativan here.  Testing help for detox, and I will consult TTS. He is not actively suicidal or homicidal and all he needs emergency psychiatric commitment.  From what I can tell, I don't see evidence of patient previously being seen by neurology or having workup  for seizure. There is a head CT from 2015, but does not appear to be related to seizures. I am going to go ahead and CT his head today.  Potassium came back low at 2.6. Patient is being given by mouth and IV replacements.  Anion gap of 20. I am adding on lactate, as I would most suspect lactic acidosis due to a seizure today. Would also keep other alcohols as possibility if acidosis is not due to lactic acidosis.  I discussed with hospitalist, Dr. Anne Hahn for admission with alcohol withdrawal seizures, and hypokalemia.    CONSULTATIONS:   TTS for detox referral/eval.  Hospitalist for admission.   Patient / Family / Caregiver informed of clinical course, medical decision-making process, and agree with plan.  ___________________________________________   FINAL CLINICAL IMPRESSION(S) / ED DIAGNOSES   Final diagnoses:  Alcohol withdrawal seizure without complication (HCC)  Hypokalemia  Note: This dictation was prepared with Dragon dictation. Any transcriptional errors that result from this process are unintentional   Governor RooksLord, Lashanna Angelo, MD 10/31/16 1327    Governor RooksLord, Taren Toops, MD 10/31/16 1359

## 2016-10-31 NOTE — ED Notes (Signed)
Pt given urinal at this time and asked for sample.

## 2016-10-31 NOTE — ED Triage Notes (Signed)
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 Per co worker pt had full body seizure and lasted aprox long per ems. VS stable. Pt states one seizure about 6months ago but not prescribed any meds.

## 2016-10-31 NOTE — ED Notes (Signed)
TTS has consulted with pt in regards to request for detox and offered education on both medical and psychiatric symptoms.   Pt was provided with referral information ([facility]). [facility] to [facility] facility as he is unsure if he can detox at this time.Pt states that he has a new job and does not have time off.  Pts Nurse ([place]) has been updated.

## 2016-10-31 NOTE — ED Notes (Signed)
2.6 K per lab, MD Lord made aware at this time

## 2016-10-31 NOTE — ED Notes (Addendum)
Pt given potassium and proceeded to projectile vomit, pt cleaned at this time , linen changed. Pt continues to have tremors noted.  Verbal orders given for zofran and 1mg  ativan .

## 2016-11-01 ENCOUNTER — Inpatient Hospital Stay: Payer: Self-pay

## 2016-11-01 DIAGNOSIS — F10230 Alcohol dependence with withdrawal, uncomplicated: Secondary | ICD-10-CM

## 2016-11-01 LAB — PHOSPHORUS
PHOSPHORUS: 3 mg/dL (ref 2.5–4.6)
PHOSPHORUS: 3.8 mg/dL (ref 2.5–4.6)

## 2016-11-01 LAB — POTASSIUM: POTASSIUM: 2.8 mmol/L — AB (ref 3.5–5.1)

## 2016-11-01 LAB — CBC
HCT: 38.1 % — ABNORMAL LOW (ref 40.0–52.0)
HCT: 40.4 % (ref 40.0–52.0)
HEMOGLOBIN: 13.5 g/dL (ref 13.0–18.0)
Hemoglobin: 14 g/dL (ref 13.0–18.0)
MCH: 34.6 pg — ABNORMAL HIGH (ref 26.0–34.0)
MCH: 34 pg (ref 26.0–34.0)
MCHC: 35.3 g/dL (ref 32.0–36.0)
MCHC: 34.6 g/dL (ref 32.0–36.0)
MCV: 98 fL (ref 80.0–100.0)
MCV: 98.1 fL (ref 80.0–100.0)
PLATELETS: 77 10*3/uL — AB (ref 150–440)
PLATELETS: 82 10*3/uL — AB (ref 150–440)
RBC: 3.89 MIL/uL — AB (ref 4.40–5.90)
RBC: 4.11 MIL/uL — ABNORMAL LOW (ref 4.40–5.90)
RDW: 15.5 % — ABNORMAL HIGH (ref 11.5–14.5)
RDW: 15.6 % — AB (ref 11.5–14.5)
WBC: 4.7 10*3/uL (ref 3.8–10.6)
WBC: 2.7 10*3/uL — AB (ref 3.8–10.6)

## 2016-11-01 LAB — HIV ANTIBODY (ROUTINE TESTING W REFLEX): HIV Screen 4th Generation wRfx: NONREACTIVE

## 2016-11-01 LAB — MAGNESIUM
MAGNESIUM: 1.6 mg/dL — AB (ref 1.7–2.4)
Magnesium: 2.8 mg/dL — ABNORMAL HIGH (ref 1.7–2.4)

## 2016-11-01 LAB — BLOOD GAS, ARTERIAL
Acid-Base Excess: 6 mmol/L — ABNORMAL HIGH (ref 0.0–2.0)
Bicarbonate: 31.8 mmol/L — ABNORMAL HIGH (ref 20.0–28.0)
FIO2: 0.21
O2 Saturation: 98.8 %
PATIENT TEMPERATURE: 37
PH ART: 7.42 (ref 7.350–7.450)
pCO2 arterial: 49 mmHg — ABNORMAL HIGH (ref 32.0–48.0)
pO2, Arterial: 121 mmHg — ABNORMAL HIGH (ref 83.0–108.0)

## 2016-11-01 LAB — COMPREHENSIVE METABOLIC PANEL
ALBUMIN: 3.1 g/dL — AB (ref 3.5–5.0)
ALK PHOS: 151 U/L — AB (ref 38–126)
ALT: 115 U/L — ABNORMAL HIGH (ref 17–63)
ALT: 133 U/L — ABNORMAL HIGH (ref 17–63)
ANION GAP: 9 (ref 5–15)
ANION GAP: 7 (ref 5–15)
AST: 337 U/L — ABNORMAL HIGH (ref 15–41)
AST: 395 U/L — AB (ref 15–41)
Albumin: 3.1 g/dL — ABNORMAL LOW (ref 3.5–5.0)
Alkaline Phosphatase: 151 U/L — ABNORMAL HIGH (ref 38–126)
BILIRUBIN TOTAL: 10.5 mg/dL — AB (ref 0.3–1.2)
BUN: <5 mg/dL — ABNORMAL LOW (ref 6–20)
BUN: <5 mg/dL — ABNORMAL LOW (ref 6–20)
CALCIUM: 8.8 mg/dL — AB (ref 8.9–10.3)
CHLORIDE: 106 mmol/L (ref 101–111)
CO2: 29 mmol/L (ref 22–32)
CO2: 28 mmol/L (ref 22–32)
Calcium: 9.1 mg/dL (ref 8.9–10.3)
Chloride: 101 mmol/L (ref 101–111)
Creatinine, Ser: 0.37 mg/dL — ABNORMAL LOW (ref 0.61–1.24)
Creatinine, Ser: <0.3 mg/dL — ABNORMAL LOW (ref 0.61–1.24)
GLUCOSE: 153 mg/dL — AB (ref 65–99)
Glucose, Bld: 82 mg/dL (ref 65–99)
POTASSIUM: 2.7 mmol/L — AB (ref 3.5–5.1)
Potassium: 2.8 mmol/L — ABNORMAL LOW (ref 3.5–5.1)
SODIUM: 141 mmol/L (ref 135–145)
Sodium: 139 mmol/L (ref 135–145)
TOTAL PROTEIN: 6.9 g/dL (ref 6.5–8.1)
TOTAL PROTEIN: 6.9 g/dL (ref 6.5–8.1)
Total Bilirubin: 10.1 mg/dL — ABNORMAL HIGH (ref 0.3–1.2)

## 2016-11-01 LAB — GLUCOSE, CAPILLARY
GLUCOSE-CAPILLARY: 146 mg/dL — AB (ref 65–99)
GLUCOSE-CAPILLARY: 146 mg/dL — AB (ref 65–99)

## 2016-11-01 LAB — MRSA PCR SCREENING: MRSA BY PCR: NEGATIVE

## 2016-11-01 LAB — PROTIME-INR
INR: 1.31
PROTHROMBIN TIME: 16.4 s — AB (ref 11.4–15.2)

## 2016-11-01 LAB — PROCALCITONIN: Procalcitonin: 0.34 ng/mL

## 2016-11-01 LAB — AMMONIA: Ammonia: 111 umol/L — ABNORMAL HIGH (ref 9–35)

## 2016-11-01 MED ORDER — HALOPERIDOL LACTATE 5 MG/ML IJ SOLN
INTRAMUSCULAR | Status: DC
Start: 2016-11-01 — End: 2016-11-01
  Filled 2016-11-01: qty 1

## 2016-11-01 MED ORDER — LACTULOSE 10 GM/15ML PO SOLN
30.0000 g | Freq: Two times a day (BID) | ORAL | Status: DC
  Administered 2016-11-02 – 2016-11-04 (×7): 30 g via NASOGASTRIC
  Filled 2016-11-01 (×7): qty 60

## 2016-11-01 MED ORDER — SODIUM CHLORIDE 0.9 % IV SOLN
INTRAVENOUS | Status: AC
  Administered 2016-11-01: via INTRAVENOUS

## 2016-11-01 MED ORDER — DEXMEDETOMIDINE HCL IN NACL 400 MCG/100ML IV SOLN: 0.4000 ug/kg/h | INTRAVENOUS | Status: DC

## 2016-11-01 MED ORDER — LORAZEPAM 2 MG/ML IJ SOLN
2.0000 mg | Freq: Once | INTRAMUSCULAR | Status: AC
  Administered 2016-11-01: 2 mg via INTRAVENOUS
  Filled 2016-11-01: qty 1

## 2016-11-01 MED ORDER — LORAZEPAM 2 MG/ML IJ SOLN
INTRAMUSCULAR | Status: AC
  Administered 2016-11-01: 1 mg via INTRAVENOUS
  Filled 2016-11-01: qty 1

## 2016-11-01 MED ORDER — POTASSIUM CHLORIDE 10 MEQ/100ML IV SOLN
10.0000 meq | INTRAVENOUS | Status: AC
  Administered 2016-11-01 – 2016-11-02 (×4): 10 meq via INTRAVENOUS
  Filled 2016-11-01 (×4): qty 100

## 2016-11-01 MED ORDER — SODIUM CHLORIDE 0.9 % IV SOLN
0.4000 ug/kg/h | INTRAVENOUS | Status: DC
  Administered 2016-11-01: 2 ug/kg/h via INTRAVENOUS
  Filled 2016-11-01 (×2): qty 4

## 2016-11-01 MED ORDER — MAGNESIUM SULFATE 4 GM/100ML IV SOLN
4.0000 g | Freq: Once | INTRAVENOUS | Status: DC
  Filled 2016-11-01: qty 100

## 2016-11-01 MED ORDER — LORAZEPAM 1 MG PO TABS: 1.0000 mg | ORAL_TABLET | Freq: Four times a day (QID) | ORAL | Status: DC | PRN

## 2016-11-01 MED ORDER — MAGNESIUM SULFATE 4 GM/100ML IV SOLN
4.0000 g | Freq: Once | INTRAVENOUS | Status: DC
  Filled 2016-11-01 (×2): qty 100

## 2016-11-01 MED ORDER — SODIUM CHLORIDE 0.9 % IV SOLN
0.4000 ug/kg/h | INTRAVENOUS | Status: DC
  Administered 2016-11-01: 2 ug/kg/h via INTRAVENOUS
  Administered 2016-11-01: 1 ug/kg/h via INTRAVENOUS
  Filled 2016-11-01: qty 2

## 2016-11-01 MED ORDER — MIDAZOLAM HCL 2 MG/2ML IJ SOLN: 2.0000 mg | Freq: Once | INTRAMUSCULAR | Status: DC

## 2016-11-01 MED ORDER — POTASSIUM CHLORIDE CRYS ER 20 MEQ PO TBCR
40.0000 meq | EXTENDED_RELEASE_TABLET | ORAL | Status: DC
  Administered 2016-11-01: 13:00:00 40 meq via ORAL
  Filled 2016-11-01: qty 2

## 2016-11-01 MED ORDER — ZIPRASIDONE MESYLATE 20 MG IM SOLR
20.0000 mg | INTRAMUSCULAR | Status: AC
  Administered 2016-11-01: 20 mg via INTRAMUSCULAR
  Filled 2016-11-01: qty 20

## 2016-11-01 MED ORDER — HYDRALAZINE HCL 20 MG/ML IJ SOLN: 10.0000 mg | INTRAMUSCULAR | Status: DC | PRN

## 2016-11-01 MED ORDER — LORAZEPAM 2 MG/ML IJ SOLN
1.0000 mg | INTRAMUSCULAR | Status: AC | PRN
  Administered 2016-11-01 – 2016-11-03 (×5): 1 mg via INTRAVENOUS
  Filled 2016-11-01 (×5): qty 1

## 2016-11-01 MED ORDER — LORAZEPAM BOLUS VIA INFUSION
2.0000 mg | Freq: Once | INTRAVENOUS | Status: DC
  Filled 2016-11-01: qty 2

## 2016-11-01 NOTE — Consult Note (Signed)
Psychiatry: Consult received. Came to see patient for consult. Chart reviewed. Patient had been transferred to the intensive care unit because of agitation presumably related to delirium tremens. By the time I got to the ICU the patient had been put on a Precedex drip and was sedated and fully monitored. Did not attempt therefore to wake him back up. Patient can continue being treated for DTs in the ICU. I will sign out the existence of the patient and the consult to the psychiatrist on call over the weekend. If consult is needed please ask him.

## 2016-11-01 NOTE — Progress Notes (Signed)
eLink Physician-Brief Progress Note Patient Name: Brett FinerJose Washington DOB: 08/27/1980 MRN: [id number]   Date of Service  11/01/2016  HPI/Events of Note  Change in mental status - Not responsive. Sat = 98%. Precedex IV infusion turned off at 8 PM.   eICU Interventions  Will order: 1. Accucheck blood glucose now. 2. Will ask APP to evaluate the patient at bedside.      Intervention Category Major Interventions: Change in mental status - evaluation and management  Marcelo Ickes Eugene 11/01/2016, 8:21 PM

## 2016-11-01 NOTE — Progress Notes (Signed)
Called by RN to assess patient again. He was very agitated and trying to get out of bed. Thrashing into the bed. Received all 2 mg of IV Ativan stat. The patient was still quite restless I give additional 2 mg of Ativan and he is calmed down some. He is tachycardic. Given his history of severe alcohol abuse and possible alcohol withdrawal seizures with alcohol and hepatitis spoke with ICU attending and we will move him to the ICU for IV Ativan drip versus Precedex drip.  Spoke with Annabelle Harmanana, NP

## 2016-11-01 NOTE — Progress Notes (Addendum)
Pharmacy Consult for Electrolyte Monitoring  No Known Allergies  Patient Measurements: Height: 5\' 6"  (167.6 cm) Weight: 184 lb 11.2 oz (83.8 kg) IBW/kg (Calculated) : 63.8  Vital Signs: Temp: 97.7 F (36.5 C) (07/20 1402) Temp Source: Oral (07/20 1402) BP: 149/103 (07/20 1800) Pulse Rate: 56 (07/20 1800) Intake/Output from previous day: 07/19 0701 - 07/20 0700 In: 788.8 [I.V.:788.8] Out: 600 [Urine:600] Intake/Output from this shift: No intake/output data recorded.  Labs:  Recent Labs  10/31/16 1234 11/01/16 0439  WBC 4.7 4.7  HGB 13.4 13.5  HCT 39.5* 38.1*  PLT 87* 77*  CREATININE 0.73 0.37*  MG  --  1.6*  PHOS  --  3.0  ALBUMIN 2.9* 3.1*  PROT 7.1 6.9  AST 391* 337*  ALT 127* 115*  ALKPHOS 162* 151*  BILITOT 8.6* 10.1*  BILIDIR 4.4*  --   IBILI 4.2*  --    Estimated Creatinine Clearance: 132.1 mL/min (A) (by C-G formula based on SCr of 0.37 mg/dL (L)).  Potassium (mmol/L)  Date Value  11/01/2016 2.8 (L)  11/11/2013 3.5   Sodium (mmol/L)  Date Value  11/01/2016 139  11/11/2013 145   Calcium (mg/dL)  Date Value  78/46/962907/20/2018 8.8 (L)   Calcium, Total (mg/dL)  Date Value  52/84/132407/30/2015 8.2 (L)   Assessment: 36 y/o M admitted with alcohol withdrawal.   Mg = 1.6 K = 2.8 after 40 mEq PO KCl  Plan:  Magnesium 4 g IV dose scheduled for this afternoon has not been charted - have retimed dose to be given now.   Will order KCl 40 mEq IV and recheck electrolytes.   Cindi CarbonMary M Eola Waldrep, PharmD, BCPS Clinical Pharmacist 11/01/2016,8:05 PM   Addendum: Magnesium dose from this afternoon is infusing but was not charted earlier. No need for additional dose.   Cindi CarbonMary M Delshawn Stech, PharmD 11/01/16 8:24 PM

## 2016-11-01 NOTE — Progress Notes (Signed)
Patient transferred to CCU. Report given to Gwenyth OberAdam RN.

## 2016-11-01 NOTE — Progress Notes (Signed)
[facility] - [place] [facility]   PATIENT NAME: Brett Washington    MR#:  [id number]  DATE OF BIRTH:  11/12/1980  SUBJECTIVE:  Came in with confusion and possible seizure. Pt admits to drinking 6-7 beers daily. No fmaily in the room  REVIEW OF SYSTEMS:   Review of Systems  Constitutional: Negative for chills, fever and weight loss.  HENT: Negative for ear discharge, ear pain and nosebleeds.   Eyes: Negative for blurred vision, pain and discharge.  Respiratory: Negative for sputum production, shortness of breath, wheezing and stridor.   Cardiovascular: Negative for chest pain, palpitations, orthopnea and PND.  Gastrointestinal: Negative for abdominal pain, diarrhea, nausea and vomiting.  Genitourinary: Negative for frequency and urgency.  Musculoskeletal: Negative for back pain and joint pain.  Neurological: Positive for tremors, speech change and weakness. Negative for sensory change and focal weakness.  Psychiatric/Behavioral: Negative for depression and hallucinations. The patient is not nervous/anxious.    Tolerating Diet: Tolerating PT:   DRUG ALLERGIES:  No Known Allergies  VITALS:  Blood pressure 138/77, pulse (!) 101, temperature 97.7 F (36.5 C), temperature source Oral, resp. rate 20, height 5\' 6"  (1.676 m), weight 83.8 kg (184 lb 11.2 oz), SpO2 99 %.  PHYSICAL EXAMINATION:   Physical Exam  GENERAL:  36 y.o.-year-old patient lying in the bed with no acute distress.  EYES: Pupils equal, round, reactive to light and accommodation. No scleral icterus. Extraocular muscles intact.  HEENT: Head atraumatic, normocephalic. Oropharynx and nasopharynx clear.  NECK:  Supple, no jugular venous distention. No thyroid enlargement, no tenderness.  LUNGS: Normal breath sounds bilaterally, no wheezing, rales, rhonchi. No use of accessory muscles of respiration. tachycardia CARDIOVASCULAR: S1, S2 normal. No murmurs, rubs, or gallops.  ABDOMEN: Soft,  nontender, nondistended. Bowel sounds present. No organomegaly or mass.  EXTREMITIES: No cyanosis, clubbing or edema b/l.    NEUROLOGIC: Cranial nerves II through XII are intact. No focal Motor or sensory deficits b/l.   PSYCHIATRIC:  patient is alert , very fidgety and restless  SKIN: No obvious rash, lesion, or ulcer.   LABORATORY PANEL:  CBC  Recent Labs Lab 11/01/16 0439  WBC 4.7  HGB 13.5  HCT 38.1*  PLT 77*    Chemistries   Recent Labs Lab 11/01/16 0439  NA 139  K 2.8*  CL 101  CO2 29  GLUCOSE 82  BUN <5*  CREATININE 0.37*  CALCIUM 8.8*  MG 1.6*  AST 337*  ALT 115*  ALKPHOS 151*  BILITOT 10.1*   Cardiac Enzymes  Recent Labs Lab 10/31/16 1234  TROPONINI <0.03   RADIOLOGY:  Ct Head Wo Contrast  Result Date: 10/31/2016 CLINICAL DATA:  Seizure activity EXAM: CT HEAD WITHOUT CONTRAST TECHNIQUE: Contiguous axial images were obtained from the base of the skull through the vertex without intravenous contrast. COMPARISON:  07/06/2013 FINDINGS: Brain: No evidence of acute infarction, hemorrhage, hydrocephalus, extra-axial collection or mass lesion/mass effect. Vascular: No hyperdense vessel or unexpected calcification. Skull: Normal. Negative for fracture or focal lesion. Sinuses/Orbits: No acute finding. Other: None. IMPRESSION: No acute intracranial abnormality noted. Electronically Signed   By: Alcide CleverMark  Lukens M.D.   On: 10/31/2016 13:19   [place] Abdomen Limited Ruq  Result Date: 11/01/2016 CLINICAL DATA:  Elevated liver enzymes EXAM: ULTRASOUND ABDOMEN LIMITED RIGHT UPPER QUADRANT COMPARISON:  None. FINDINGS: Gallbladder: No gallstones or wall thickening visualized. There is no pericholecystic fluid. No sonographic Murphy sign noted by sonographer. Common bile duct: Diameter: 4 mm. No intrahepatic or  extrahepatic biliary duct dilatation. Liver: No focal lesion identified. Liver echogenicity is increased diffusely. IMPRESSION: Diffuse increase in liver echogenicity, a  finding most likely indicative of hepatic steatosis. While no focal liver lesions are evident on this study, it must be cautioned that the sensitivity of ultrasound for detection of focal liver lesions is diminished in this circumstance. Study otherwise unremarkable. Electronically Signed   By: Bretta Bang III M.D.   On: 11/01/2016 09:47   ASSESSMENT AND PLAN:  Ashley Montminy  is a 36 y.o. male who presents with Seizure episode. Patient was working when he had his seizure. He states that he has had a seizure like this before due to alcohol withdrawal. He states that he drinks about 8 years daily. His alcohol level in the ED was only 6.   * Acute DT's due to severe ETOH withdrawal -pt on CIWA protocol -still tachy and quiet restless. -transfer to ICU if no improvement for precedex gtt  * Alcohol withdrawal seizure (HCC) - CIWA protocol. Seizure precautions. - Patient states that he wants to quit drinking -Psych consult when appropriate  *Thrombocytopenia (HCC) - potentially related to alcohol related liver dysfunction, we'll screen him for cirrhosis  * acute Hepatic Transaminitis - potentially alcohol and his hepatitis, although with thrombocytopenia as above this could be early cirrhosis  -Bilirubin 10.6!! -right upper quadrant ultrasound negative but shows steatosis  *  Hypokalemia - replace and monitor Check mag and phosphorus   Case discussed with Care Management/Social Worker. Management plans discussed with the patient, family and they are in agreement.  CODE STATUS: FULL  DVT Prophylaxis: lovenox  TOTAL TIME TAKING CARE OF THIS PATIENT: 30 minutes.  >50% time spent on counselling and coordination of care  POSSIBLE D/C IN 2-3 DAYS, DEPENDING ON CLINICAL CONDITION.  Note: This dictation was prepared with Dragon dictation along with smaller phrase technology. Any transcriptional errors that result from this process are unintentional.  Audine Mangione M.D on 11/01/2016 at 4:33  PM  Between 7am to 6pm - Pager - [phone]  After 6pm go to [email or web address] - Social research officer, government  [facility]  Office  [phone]  CC: Primary care physician; Center, [facility]

## 2016-11-01 NOTE — Progress Notes (Signed)
MEDICATION RELATED CONSULT NOTE - INITIAL   Pharmacy Consult for electrolyte replacement Indication: hypokalemia  No Known Allergies  Patient Measurements: Height: 5\' 6"  (167.6 cm) Weight: 184 lb 11.2 oz (83.8 kg) IBW/kg (Calculated) : 63.8 Adjusted Body Weight:   Vital Signs: Temp: 98.3 F (36.8 C) (07/20 0240) Temp Source: Oral (07/20 0240) BP: 140/76 (07/20 0240) Pulse Rate: 94 (07/20 0240) Intake/Output from previous day: 07/19 0701 - 07/20 0700 In: 788.8 [I.V.:788.8] Out: 600 [Urine:600] Intake/Output from this shift: Total I/O In: -  Out: 400 [Urine:400]  Labs:  Recent Labs  10/31/16 1234 11/01/16 0439  WBC 4.7 4.7  HGB 13.4 13.5  HCT 39.5* 38.1*  PLT 87* 77*  CREATININE 0.73 0.37*  ALBUMIN 2.9* 3.1*  PROT 7.1 6.9  AST 391* 337*  ALT 127* 115*  ALKPHOS 162* 151*  BILITOT 8.6* 10.1*  BILIDIR 4.4*  --   IBILI 4.2*  --    Estimated Creatinine Clearance: 132.1 mL/min (A) (by C-G formula based on SCr of 0.37 mg/dL (L)).   Microbiology: No results found for this or any previous visit (from the past 720 hour(s)).  Medical History: Past Medical History:  Diagnosis Date  . Alcohol abuse   . Heart murmur   . Seizures (HCC)     Medications:  Infusions:  . sodium chloride 75 mL/hr at 11/01/16 0600    Assessment: 35 yom cc seizures. Noted history of substance use disorder with alcohol - hypokalemic. Pharmacy consulted to manage electrolytes.  Goal of Therapy:  Electrolytes WNL  Plan:  K 2.8 this AM. Will give potassium chloride 40 mEq po Q4H x 2 doses, check magnesium and phosphorus as add-on labs from this AM, recheck BMP this evening, and recheck all electrolytes tomorrow with AM labs.   Carola FrostNathan A Louvenia Golomb, Pharm.D., BCPS Clinical Pharmacist 11/01/2016,12:26 PM

## 2016-11-01 NOTE — Progress Notes (Signed)
Pharmacy Consult for Electrolyte Monitoring  No Known Allergies  Patient Measurements: Height: 5\' 6"  (167.6 cm) Weight: 184 lb 11.2 oz (83.8 kg) IBW/kg (Calculated) : 63.8  Vital Signs: Temp: 97.7 F (36.5 C) (07/20 1402) Temp Source: Oral (07/20 1402) BP: 138/77 (07/20 1402) Pulse Rate: 101 (07/20 1402) Intake/Output from previous day: 07/19 0701 - 07/20 0700 In: 788.8 [I.V.:788.8] Out: 600 [Urine:600] Intake/Output from this shift: Total I/O In: 240 [P.O.:240] Out: 400 [Urine:400]  Labs:  Recent Labs  10/31/16 1234 11/01/16 0439  WBC 4.7 4.7  HGB 13.4 13.5  HCT 39.5* 38.1*  PLT 87* 77*  CREATININE 0.73 0.37*  MG  --  1.6*  PHOS  --  3.0  ALBUMIN 2.9* 3.1*  PROT 7.1 6.9  AST 391* 337*  ALT 127* 115*  ALKPHOS 162* 151*  BILITOT 8.6* 10.1*  BILIDIR 4.4*  --   IBILI 4.2*  --    Estimated Creatinine Clearance: 132.1 mL/min (A) (by C-G formula based on SCr of 0.37 mg/dL (L)).  Potassium (mmol/L)  Date Value  11/01/2016 2.8 (L)  11/11/2013 3.5   Sodium (mmol/L)  Date Value  11/01/2016 139  11/11/2013 145   Calcium (mg/dL)  Date Value  45/40/981107/20/2018 8.8 (L)   Calcium, Total (mg/dL)  Date Value  91/47/829507/30/2015 8.2 (L)   Assessment: 36 y/o M admitted with alcohol withdrawal.   Plan:  Potassium chloride 40 meq x 2 ordered. Will order magnesium sulfate 4 g iv once and f/u K at 1800 (second dose scheduled for 1800).   Luisa Harthristy, Kwame Ryland D 11/01/2016,3:58 PM

## 2016-11-01 NOTE — Progress Notes (Signed)
Pt arrived from floor, agitated without IV and no monitor attached.  Pt convinced to allow IV placement.  Precedex started, Geodon given IM.  Pt attempting to stand up in bed, rolling around 360 degrees, getting tangled in monitor cords.  Precedex increased by Annabelle Harmanana NP at bedside.

## 2016-11-01 NOTE — Consult Note (Signed)
Name: Brett Washington MRN: [id number] DOB: 05/02/1980    ADMISSION DATE:  10/31/2016 CONSULTATION DATE:  11/01/2016  REFERRING MD :  Dr. Allena KatzPatel   CHIEF COMPLAINT:  Altered Mental Status & Delirium Tremens'  BRIEF PATIENT DESCRIPTION:  36 y.o. Hispanic Male with generalized seizure at work on 7/19 likely secondary to ETOH withdrawal, now with severe DT's [facility]  SIGNIFICANT EVENTS  10/31/16>>[facility] 11/01/16>> Transfer to Stepdown unit due to severe DT's  STUDIES:  10/31/16 CT Head>>Brain: No evidence of acute infarction, hemorrhage, hydrocephalus, extra-axial collection or mass lesion/mass effect. Vascular: No hyperdense vessel or unexpected calcification. Skull: Normal. Negative for fracture or focal lesion. Sinuses/Orbits: No acute finding. IMPRESSION: No acute intracranial abnormality noted.    HISTORY OF PRESENT ILLNESS:  Brett Washington is a 36 y.o. Male with a PMH of Alcohol abuse, seizures, and heart murmur.  [facility] status post generalized seizure at work lasting approximately 2 minutes, with very brief post ictal period.  No trauma was noted.  In ED he was noted to have hypokalemia, thrombocytopenia, and Transaminitis.  [facility].  On 7/20, pt having severe DT's with increased agitation and confusion.  Nursing staff heard a loud thud from the patient's room and it is presumed that he fell in his room. [facility].  PCCM is consulted for further management of severe DT's.    PAST MEDICAL HISTORY :   has a past medical history of Alcohol abuse; Heart murmur; and Seizures (HCC).  has a past surgical history that includes Hand surgery. Prior to Admission medications   Not on File   No Known Allergies  FAMILY HISTORY:  Family history is unknown by patient. SOCIAL HISTORY:  reports that he has never smoked. He has  never used smokeless tobacco. He reports that he drinks about 33.6 oz of alcohol per week . He reports that he does not use drugs.  REVIEW OF SYSTEMS:   Unable to assess due to pt confusion and agitation  SUBJECTIVE:  Unable to assess due to pt confusion and agitation  VITAL SIGNS: Temp:  [97.7 F (36.5 C)-98.9 F (37.2 C)] 97.7 F (36.5 C) (07/20 1402) Pulse Rate:  [88-101] 101 (07/20 1402) Resp:  [17-23] 20 (07/20 0240) BP: (123-140)/(53-93) 138/77 (07/20 1402) SpO2:  [97 %-99 %] 99 % (07/20 1402) Weight:  [83.8 kg (184 lb 11.2 oz)] 83.8 kg (184 lb 11.2 oz) (07/19 1607)  PHYSICAL EXAMINATION: General:  Agitated, restless, Hispanic male, sitting in bed, in no acute resp distress Neuro:  Confused, poor attention, oriented only to self, PERRLA, CN II-XII intact HEENT:  Atraumatic, Normocephalic, No JVD, Neck supple, Scleral icterus present bilateraly Cardiovascular:  RRR, 3/6 Diastolic Murmur, No rubs or gallops, No peripheral edema Lungs:  Regular, unlabored, Symmetrical expansion, Clear throughout Abdomen:  Soft, nontender, nondistended, Bowel sounds present Musculoskeletal:  Active ROM all extremities, limited range of motion of left upper extremity due to previous work injury, strength 5/5 except in left upper extremity due to injury Skin:  Bruise to right cheek, No lesions, rashes, or ulcerations   Recent Labs Lab 10/31/16 1234 11/01/16 0439  NA 134* 139  K 2.6* 2.8*  CL 91* 101  CO2 23 29  BUN <5* <5*  CREATININE 0.73 0.37*  GLUCOSE 140* 82    Recent Labs Lab 10/31/16 1234 11/01/16 0439  HGB 13.4  13.5  HCT 39.5* 38.1*  WBC 4.7 4.7  PLT 87* 77*   Ct Head Wo Contrast  Result Date: 10/31/2016 CLINICAL DATA:  Seizure activity EXAM: CT HEAD WITHOUT CONTRAST TECHNIQUE: Contiguous axial images were obtained from the base of the skull through the vertex without intravenous contrast. COMPARISON:  07/06/2013 FINDINGS: Brain: No evidence of acute infarction,  hemorrhage, hydrocephalus, extra-axial collection or mass lesion/mass effect. Vascular: No hyperdense vessel or unexpected calcification. Skull: Normal. Negative for fracture or focal lesion. Sinuses/Orbits: No acute finding. Other: None. IMPRESSION: No acute intracranial abnormality noted. Electronically Signed   By: Alcide Clever M.D.   On: 10/31/2016 13:19   US Abdomen Limited Ruq  Result Date: 11/01/2016 CLINICAL DATA:  Elevated liver enzymes EXAM: ULTRASOUND ABDOMEN LIMITED RIGHT UPPER QUADRANT COMPARISON:  None. FINDINGS: Gallbladder: No gallstones or wall thickening visualized. There is no pericholecystic fluid. No sonographic Murphy sign noted by sonographer. Common bile duct: Diameter: 4 mm. No intrahepatic or extrahepatic biliary duct dilatation. Liver: No focal lesion identified. Liver echogenicity is increased diffusely. IMPRESSION: Diffuse increase in liver echogenicity, a finding most likely indicative of hepatic steatosis. While no focal liver lesions are evident on this study, it must be cautioned that the sensitivity of ultrasound for detection of focal liver lesions is diminished in this circumstance. Study otherwise unremarkable. Electronically Signed   By: Bretta Bang III M.D.   On: 11/01/2016 09:47    ASSESSMENT / PLAN:  A: Acute Encephalopathy secondary to Delirium Tremens' Seizure secondary to Alcohol Withdrawal Elevated Liver Enzymes Hx: ETOH abuse, seizures  P: Precedex gtt Geodon IM x1 dose CIWA protocol Psych consulted, appreciate input Seizure precautions Follow LFT's Obtain CT Head today once pt is sedated and calm due to Fall    11/01/2016, 2:28 PM    Elishia Kaczorowski Santiago Glad, M.D.  [facility]  Medical Director [facility] Medical Director [facility] Cardio-Pulmonary Department

## 2016-11-01 NOTE — Progress Notes (Signed)
Yellow arm band in place.  Bed alarm on.  Seizure precautions in place.  No evidence of seizure this shift.

## 2016-11-02 ENCOUNTER — Inpatient Hospital Stay: Payer: Self-pay

## 2016-11-02 DIAGNOSIS — G9341 Metabolic encephalopathy: Secondary | ICD-10-CM

## 2016-11-02 LAB — BASIC METABOLIC PANEL
Anion gap: 8 (ref 5–15)
BUN: 5 mg/dL — ABNORMAL LOW (ref 6–20)
CALCIUM: 9.1 mg/dL (ref 8.9–10.3)
CO2: 29 mmol/L (ref 22–32)
CREATININE: 0.37 mg/dL — AB (ref 0.61–1.24)
Chloride: 106 mmol/L (ref 101–111)
GFR calc non Af Amer: >60 mL/min (ref >60)
Glucose, Bld: 114 mg/dL — ABNORMAL HIGH (ref 65–99)
Potassium: 3.5 mmol/L (ref 3.5–5.1)
SODIUM: 143 mmol/L (ref 135–145)

## 2016-11-02 LAB — MAGNESIUM: MAGNESIUM: 2.3 mg/dL (ref 1.7–2.4)

## 2016-11-02 LAB — AMMONIA: Ammonia: 76 umol/L — ABNORMAL HIGH (ref 9–35)

## 2016-11-02 LAB — PHOSPHORUS: PHOSPHORUS: 3.2 mg/dL (ref 2.5–4.6)

## 2016-11-02 MED ORDER — DIAZEPAM 5 MG PO TABS
5.0000 mg | ORAL_TABLET | Freq: Four times a day (QID) | ORAL | Status: AC
  Administered 2016-11-02 – 2016-11-04 (×6): 5 mg
  Filled 2016-11-02 (×5): qty 1

## 2016-11-02 MED ORDER — DEXMEDETOMIDINE HCL IN NACL 400 MCG/100ML IV SOLN
0.4000 ug/kg/h | INTRAVENOUS | Status: DC
Start: 2016-11-02 — End: 2016-11-03
  Administered 2016-11-02 – 2016-11-03 (×11): 2 ug/kg/h via INTRAVENOUS
  Filled 2016-11-02 (×13): qty 100

## 2016-11-02 MED ORDER — DIAZEPAM 5 MG PO TABS
5.0000 mg | ORAL_TABLET | Freq: Four times a day (QID) | ORAL | Status: DC
  Administered 2016-11-02 (×2): 5 mg via ORAL
  Filled 2016-11-02 (×3): qty 1

## 2016-11-02 MED ORDER — LORAZEPAM 2 MG/ML IJ SOLN
1.0000 mg | Freq: Once | INTRAMUSCULAR | Status: AC
  Administered 2016-11-02: 1 mg via INTRAVENOUS
  Filled 2016-11-02: qty 1

## 2016-11-02 MED ORDER — DIAZEPAM 5 MG/ML IJ SOLN: 5.0000 mg | Freq: Four times a day (QID) | INTRAMUSCULAR | Status: DC

## 2016-11-02 NOTE — Consult Note (Signed)
Name: Brett Washington MRN: [id number] DOB: 04/06/1981    ADMISSION DATE:  10/31/2016 CONSULTATION DATE:  11/01/2016  REFERRING MD :  Dr. Allena KatzPatel   CHIEF COMPLAINT:  Altered Mental Status & Delirium Tremens'  BRIEF PATIENT DESCRIPTION:  36 y.o. Hispanic Male with generalized seizure at work on 7/19 likely secondary to ETOH withdrawal, now with severe DT's [facility]  SIGNIFICANT EVENTS  10/31/16>>[facility]/01/16>> Transfer to Stepdown unit due to severe DT's  STUDIES:  10/31/16 CT Head>>Brain: No evidence of acute infarction, hemorrhage, hydrocephalus, extra-axial collection or mass lesion/mass effect. Vascular: No hyperdense vessel or unexpected calcification. Skull: Normal. Negative for fracture or focal lesion. Sinuses/Orbits: No acute finding. IMPRESSION: No acute intracranial abnormality noted.    HISTORY OF PRESENT ILLNESS:  Brett Washington is a 36 y.o. Male with a PMH of Alcohol abuse, seizures, and heart murmur.  [facility] status post generalized seizure at work lasting approximately 2 minutes, with very brief post ictal period.  No trauma was noted.  In ED he was noted to have hypokalemia, thrombocytopenia, and Transaminitis.  [facility]  On 7/20, pt having severe DT's with increased agitation and confusion.  Nursing staff heard a loud thud from the patient's room and it is presumed that he fell in his room. [facility]  PCCM is consulted for further management of severe DT's.  Subjective Patient with severe agitation and severe DTs Patient needs ICU monitoring Patient remains on Precedex infusion Patient is at high risk for intubation Patient remains critically ill  REVIEW OF SYSTEMS:   Unable to assess due to pt confusion and agitation   Temp:  [93.6 F (34.2 C)-97.7 F (36.5 C)] 97.7 F (36.5 C) (07/21 0701) Pulse  Rate:  [54-101] 67 (07/21 0701) Resp:  [10-24] 22 (07/21 0701) BP: (104-149)/(77-112) 113/78 (07/21 0700) SpO2:  [89 %-99 %] 92 % (07/21 0701)  PHYSICAL EXAMINATION: General:  Agitated, restless, Hispanic male, sitting in bed, in distress froim agtation/delerium Neuro:  Confused, poor attention, oriented only to self, PERRLA, CN II-XII intact HEENT:  Atraumatic, Normocephalic, No JVD, Neck supple, Scleral icterus present bilateraly Cardiovascular:  RRR, 3/6 Diastolic Murmur, No rubs or gallops, No peripheral edema Lungs:  Regular, unlabored, Symmetrical expansion, Clear throughout Abdomen:  Soft, nontender, nondistended, Bowel sounds present Musculoskeletal:  Active ROM all extremities, limited range of motion of left upper extremity due to previous work injury, strength 5/5 except in left upper extremity due to injury Skin:  Bruise to right cheek, No lesions, rashes, or ulcerations   Recent Labs Lab 11/01/16 0439 11/01/16 1919 11/01/16 2101 11/02/16 0149  NA 139  --  141 143  K 2.8* 2.8* 2.7* 3.5  CL 101  --  106 106  CO2 29  --  28 29  BUN <5*  --  <5* 5*  CREATININE 0.37*  --  <0.30* 0.37*  GLUCOSE 82  --  153* 114*    Recent Labs Lab 10/31/16 1234 11/01/16 0439 11/01/16 2101  HGB 13.4 13.5 14.0  HCT 39.5* 38.1* 40.4  WBC 4.7 4.7 2.7*  PLT 87* 77* 82*    ASSESSMENT / PLAN: 36 year old Hispanic male admitted to the intensive care unit for severe decompensation and severe delirium from alcohol which are all with acute metabolic encephalopathy and high risk for intubation   Precedex gtt Geodon IM as needed CIWA  protocol Psych consulted, appreciate input Seizure precautions Follow LFT's Continue ICU monitoring High risk for intubation   Critical Care Time devoted to patient care services described in this note is 37 minutes.   Overall, patient is critically ill, prognosis is guarded.    Lucie Leather, M.D.  [facility]    Medical Director [facility] Medical Director [facility] Cardio-Pulmonary Department         Wallis Bamberg Santiago Glad, M.D.  [facility] Pulmonary & Critical Care Medicine  Medical Director [facility] Medical Director [facility] Cardio-Pulmonary Department

## 2016-11-02 NOTE — Consult Note (Addendum)
Attempted to see patient for consult but he remains sedated on Precedex drip. Will continue to check in intermittently until the patient is more alert and responsive after extubation. Please page the on call psychiatrist when the patient is ready

## 2016-11-02 NOTE — Progress Notes (Signed)
Pharmacy Consult for Electrolyte Monitoring  No Known Allergies  Patient Measurements: Height: 5\' 6"  (167.6 cm) Weight: 184 lb 11.2 oz (83.8 kg) IBW/kg (Calculated) : 63.8  Vital Signs: Temp: 97.7 F (36.5 C) (07/21 0300) Temp Source: Rectal (07/21 0300) BP: 139/102 (07/21 0300) Pulse Rate: 66 (07/21 0300) Intake/Output from previous day: 07/20 0701 - 07/21 0700 In: 835.3 [P.O.:240; I.V.:135.3; NG/GT:60; IV Piggyback:400] Out: 1025 [Urine:1025] Intake/Output from this shift: Total I/O In: 460 [NG/GT:60; IV Piggyback:400] Out: 625 [Urine:625]  Labs:  Recent Labs  10/31/16 1234 11/01/16 0439 11/01/16 2101 11/02/16 0149  WBC 4.7 4.7 2.7*  --   HGB 13.4 13.5 14.0  --   HCT 39.5* 38.1* 40.4  --   PLT 87* 77* 82*  --   CREATININE 0.73 0.37* <0.30* 0.37*  MG  --  1.6* 2.8* 2.3  PHOS  --  3.0 3.8 3.2  ALBUMIN 2.9* 3.1* 3.1*  --   PROT 7.1 6.9 6.9  --   AST 391* 337* 395*  --   ALT 127* 115* 133*  --   ALKPHOS 162* 151* 151*  --   BILITOT 8.6* 10.1* 10.5*  --   BILIDIR 4.4*  --   --   --   IBILI 4.2*  --   --   --    Estimated Creatinine Clearance: 132.1 mL/min (A) (by C-G formula based on SCr of 0.37 mg/dL (L)).  Potassium (mmol/L)  Date Value  11/02/2016 3.5  11/11/2013 3.5   Sodium (mmol/L)  Date Value  11/02/2016 143  11/11/2013 145   Calcium (mg/dL)  Date Value  16/10/960407/21/2018 9.1   Calcium, Total (mg/dL)  Date Value  54/09/811907/30/2015 8.2 (L)   Assessment: 36 y/o M admitted with alcohol withdrawal.   Mg = 1.6 K = 2.8 after 40 mEq PO KCl  Plan:  Magnesium 4 g IV dose scheduled for this afternoon has not been charted - have retimed dose to be given now.   Will order KCl 40 mEq IV and recheck electrolytes.   Addendum: Magnesium dose from this afternoon is infusing but was not charted earlier. No need for additional dose.   7/21 @ 0149 K+ 3.5, Mg 2.3 -- within range. No need for further supplementation.  Thomasene Rippleavid  Caelan Branden, PharmD, BCPS Clinical  Pharmacist 11/02/2016,3:25 AM

## 2016-11-02 NOTE — Plan of Care (Signed)
Problem: Education: Goal: Knowledge of [facility] Education information/materials will improve Outcome: Not Progressing Pt was combative, agitated and confused overnight. Pt remains on Precedex gtt with family members present at bedside.

## 2016-11-02 NOTE — Clinical Social Work Note (Signed)
CSW will assess when the patient is A&O at least x2. CSW is following.  Argentina PonderKaren Martha York Valliant, MSW, Theresia MajorsLCSWA [phone]

## 2016-11-02 NOTE — Progress Notes (Signed)
[facility] [place] [facility]   PATIENT NAME: Brett Washington    MR#:  [id number]  DATE OF BIRTH:  10/27/1980  SUBJECTIVE:  Patient currently sedated with IV Precedex. Patient appears to be somewhat confused. Family the room. NG tube placed for lactulose  REVIEW OF SYSTEMS:   Review of Systems  Constitutional: Negative for chills, fever and weight loss.  HENT: Negative for ear discharge, ear pain and nosebleeds.   Eyes: Negative for blurred vision, pain and discharge.  Respiratory: Negative for sputum production, shortness of breath, wheezing and stridor.   Cardiovascular: Negative for chest pain, palpitations, orthopnea and PND.  Gastrointestinal: Negative for abdominal pain, diarrhea, nausea and vomiting.  Genitourinary: Negative for frequency and urgency.  Musculoskeletal: Negative for back pain and joint pain.  Neurological: Positive for tremors, speech change and weakness. Negative for sensory change and focal weakness.  Psychiatric/Behavioral: Negative for depression and hallucinations. The patient is not nervous/anxious.    Tolerating Diet:Nothing by mouth Tolerating PT: On Precedex drip  DRUG ALLERGIES:  No Known Allergies  VITALS:  Blood pressure 113/76, pulse 75, temperature 99 F (37.2 C), resp. rate 19, height 5\' 6"  (1.676 m), weight 83.8 kg (184 lb 11.2 oz), SpO2 92 %.  PHYSICAL EXAMINATION:   Physical Exam  GENERAL:  36 y.o.-year-old patient lying in the bed with no acute distress.  EYES: Pupils equal, round, reactive to light and accommodation. No scleral icterus. Extraocular muscles intact.  HEENT: Head atraumatic, normocephalic. Oropharynx and nasopharynx clear.  NECK:  Supple, no jugular venous distention. No thyroid enlargement, no tenderness.  LUNGS: Normal breath sounds bilaterally, no wheezing, rales, rhonchi. No use of accessory muscles of respiration. tachycardia CARDIOVASCULAR: S1, S2 normal. No murmurs, rubs, or  gallops.  ABDOMEN: Soft, nontender, nondistended. Bowel sounds present. No organomegaly or mass.  EXTREMITIES: No cyanosis, clubbing or edema b/l.    NEUROLOGIC: Moving all extremities well PSYCHIATRIC: Patient restless secondary to alcohol withdrawal  SKIN: No obvious rash, lesion, or ulcer.   LABORATORY PANEL:  CBC  Recent Labs Lab 11/01/16 2101  WBC 2.7*  HGB 14.0  HCT 40.4  PLT 82*    Chemistries   Recent Labs Lab 11/01/16 2101 11/02/16 0149  NA 141 143  K 2.7* 3.5  CL 106 106  CO2 28 29  GLUCOSE 153* 114*  BUN <5* 5*  CREATININE <0.30* 0.37*  CALCIUM 9.1 9.1  MG 2.8* 2.3  AST 395*  --   ALT 133*  --   ALKPHOS 151*  --   BILITOT 10.5*  --    Cardiac Enzymes  Recent Labs Lab 10/31/16 1234  TROPONINI <0.03   RADIOLOGY:  Ct Head Wo Contrast  Result Date: 11/01/2016 CLINICAL DATA:  Seizure, alcohol withdrawal EXAM: CT HEAD WITHOUT CONTRAST TECHNIQUE: Contiguous axial images were obtained from the base of the skull through the vertex without intravenous contrast. COMPARISON:  10/31/2016 FINDINGS: Brain: No evidence of acute infarction, hemorrhage, hydrocephalus, extra-axial collection or mass lesion/mass effect. Vascular: No hyperdense vessel or unexpected calcification. Skull: Normal. Negative for fracture or focal lesion. Sinuses/Orbits: No acute finding. Other: None. IMPRESSION: Normal head CT. No interval change. Electronically Signed   By: Charline BillsSriyesh  Krishnan M.D.   On: 11/01/2016 18:03   Dg Abd Portable 1v  Result Date: 11/02/2016 CLINICAL DATA:  Nasogastric tube placement EXAM: PORTABLE ABDOMEN - 1 VIEW COMPARISON:  None. FINDINGS: Tip and side port of the nasogastric tube are in the stomach. There are multiple gas distended loops  of bowel throughout the abdomen. Visualized lung bases are clear. IMPRESSION: NG tube tip and side-port in the stomach. Numerous loops of gas distended bowel throughout the abdomen. Electronically Signed   By: Deatra Robinson M.D.    On: 11/02/2016 00:34   US Abdomen Limited Ruq  Result Date: 11/01/2016 CLINICAL DATA:  Elevated liver enzymes EXAM: ULTRASOUND ABDOMEN LIMITED RIGHT UPPER QUADRANT COMPARISON:  None. FINDINGS: Gallbladder: No gallstones or wall thickening visualized. There is no pericholecystic fluid. No sonographic Murphy sign noted by sonographer. Common bile duct: Diameter: 4 mm. No intrahepatic or extrahepatic biliary duct dilatation. Liver: No focal lesion identified. Liver echogenicity is increased diffusely. IMPRESSION: Diffuse increase in liver echogenicity, a finding most likely indicative of hepatic steatosis. While no focal liver lesions are evident on this study, it must be cautioned that the sensitivity of ultrasound for detection of focal liver lesions is diminished in this circumstance. Study otherwise unremarkable. Electronically Signed   By: Bretta Bang III M.D.   On: 11/01/2016 09:47   ASSESSMENT AND PLAN:  Brett Washington  is a 36 y.o. male who presents with Seizure episode. Patient was working when he had his seizure. He states that he has had a seizure like this before due to alcohol withdrawal. He states that he drinks about 8 years daily. His alcohol level in the ED was only 6.   * Acute DT's due to severe ETOH withdrawal -pt on CIWA protocol -still tachy and quiet restless. -Patient was transferred yesterday to ICU and currently on IV precedex gtt  * Alcohol withdrawal seizure (HCC) - CIWA protocol. Seizure precautions. - Patient states that he wants to quit drinking -Psych consult when appropriate  *Thrombocytopenia (HCC) - potentially related to alcohol related liver dysfunction, we'll screen him for cirrhosis  * acute Hepatic Transaminitis -due to alcohol hepatitis, although with thrombocytopenia as above this could be early cirrhosis  -Bilirubin 10.6!! 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 Explained if patient does not stop drinking he is a very poor prognosis.   Case discussed with Care Management/Social Worker. Management plans discussed with the patient, family and they are in agreement.  CODE STATUS: FULL  DVT Prophylaxis: lovenox  TOTAL TIME TAKING CARE OF THIS PATIENT: 30 minutes.  >50% time spent on counselling and coordination of care  POSSIBLE D/C IN 2-3 DAYS, DEPENDING ON CLINICAL CONDITION.  Note: This dictation was prepared with Dragon dictation along with smaller phrase technology. Any transcriptional errors that result from this process are unintentional.  Neelie Welshans M.D on 11/02/2016 at 1:25 PM  Between 7am to 6pm - Pager - [phone]  After 6pm go to [email or web address] - Social research officer, government  [facility]  Office  [phone]  CC: Primary care physician; Center, [facility]

## 2016-11-02 NOTE — Progress Notes (Signed)
Pharmacy Consult for Electrolyte Monitoring  No Known Allergies   Assessment: 36 y/o M admitted with alcohol withdrawal. Pharmacy consulted for oral supplementation of electrolytes  Electrolytes WNL this morning, patient started on lactulose.  Plan:  No supplementation needed at this time, will recheck tomorrow AM.  Patient Measurements: Height: 5\' 6"  (167.6 cm) Weight: 184 lb 11.2 oz (83.8 kg) IBW/kg (Calculated) : 63.8  Vital Signs: Temp: 97.7 F (36.5 C) (07/21 0701) Temp Source: Rectal (07/21 0700) BP: 113/78 (07/21 0700) Pulse Rate: 67 (07/21 0701) Intake/Output from previous day: 07/20 0701 - 07/21 0700 In: 835.3 [P.O.:240; I.V.:135.3; NG/GT:60; IV Piggyback:400] Out: 1025 [Urine:1025] Intake/Output from this shift: No intake/output data recorded.  Labs:  Recent Labs  10/31/16 1234 11/01/16 0439 11/01/16 2101 11/02/16 0149  WBC 4.7 4.7 2.7*  --   HGB 13.4 13.5 14.0  --   HCT 39.5* 38.1* 40.4  --   PLT 87* 77* 82*  --   CREATININE 0.73 0.37* <0.30* 0.37*  MG  --  1.6* 2.8* 2.3  PHOS  --  3.0 3.8 3.2  ALBUMIN 2.9* 3.1* 3.1*  --   PROT 7.1 6.9 6.9  --   AST 391* 337* 395*  --   ALT 127* 115* 133*  --   ALKPHOS 162* 151* 151*  --   BILITOT 8.6* 10.1* 10.5*  --   BILIDIR 4.4*  --   --   --   IBILI 4.2*  --   --   --    Estimated Creatinine Clearance: 132.1 mL/min (A) (by C-G formula based on SCr of 0.37 mg/dL (L)).  Potassium (mmol/L)  Date Value  11/02/2016 3.5  11/11/2013 3.5   Sodium (mmol/L)  Date Value  11/02/2016 143  11/11/2013 145   Calcium (mg/dL)  Date Value  86/57/846907/21/2018 9.1   Calcium, Total (mg/dL)  Date Value  62/95/284107/30/2015 8.2 (L)   Garlon HatchetJody Genevie Elman, PharmD, BCPS Clinical Pharmacist 11/02/2016 11:19 AM

## 2016-11-03 ENCOUNTER — Inpatient Hospital Stay: Payer: Self-pay

## 2016-11-03 DIAGNOSIS — F10231 Alcohol dependence with withdrawal delirium: Principal | ICD-10-CM

## 2016-11-03 DIAGNOSIS — R569 Unspecified convulsions: Secondary | ICD-10-CM

## 2016-11-03 DIAGNOSIS — J690 Pneumonitis due to inhalation of food and vomit: Secondary | ICD-10-CM

## 2016-11-03 DIAGNOSIS — R740 Nonspecific elevation of levels of transaminase and lactic acid dehydrogenase [LDH]: Secondary | ICD-10-CM

## 2016-11-03 DIAGNOSIS — J9601 Acute respiratory failure with hypoxia: Secondary | ICD-10-CM

## 2016-11-03 DIAGNOSIS — F10239 Alcohol dependence with withdrawal, unspecified: Secondary | ICD-10-CM

## 2016-11-03 LAB — BLOOD GAS, ARTERIAL
Acid-Base Excess: 0.7 mmol/L (ref 0.0–2.0)
BICARBONATE: 26 mmol/L (ref 20.0–28.0)
FIO2: 40
LHR: 16 {breaths}/min
O2 Saturation: 90.7 %
PATIENT TEMPERATURE: 37
PCO2 ART: 43 mmHg (ref 32.0–48.0)
PEEP: 5 cmH2O
PH ART: 7.39 (ref 7.350–7.450)
PO2 ART: 61 mmHg — AB (ref 83.0–108.0)
VT: 500 mL

## 2016-11-03 LAB — BASIC METABOLIC PANEL
ANION GAP: 8 (ref 5–15)
BUN: 6 mg/dL (ref 6–20)
CHLORIDE: 114 mmol/L — AB (ref 101–111)
CO2: 26 mmol/L (ref 22–32)
Calcium: 8.9 mg/dL (ref 8.9–10.3)
Creatinine, Ser: 0.3 mg/dL — ABNORMAL LOW (ref 0.61–1.24)
GFR calc non Af Amer: >60 mL/min (ref >60)
Glucose, Bld: 119 mg/dL — ABNORMAL HIGH (ref 65–99)
Potassium: 3.3 mmol/L — ABNORMAL LOW (ref 3.5–5.1)
Sodium: 148 mmol/L — ABNORMAL HIGH (ref 135–145)

## 2016-11-03 LAB — TRIGLYCERIDES: Triglycerides: 218 mg/dL — ABNORMAL HIGH (ref <150)

## 2016-11-03 LAB — PHOSPHORUS

## 2016-11-03 LAB — PROCALCITONIN: Procalcitonin: 1.03 ng/mL

## 2016-11-03 LAB — MAGNESIUM: Magnesium: 1.7 mg/dL (ref 1.7–2.4)

## 2016-11-03 MED ORDER — FENTANYL CITRATE (PF) 100 MCG/2ML IJ SOLN
200.0000 ug | Freq: Once | INTRAMUSCULAR | Status: AC
  Administered 2016-11-03: 200 ug via INTRAVENOUS

## 2016-11-03 MED ORDER — FENTANYL CITRATE (PF) 100 MCG/2ML IJ SOLN
INTRAMUSCULAR | Status: AC
  Administered 2016-11-03: 12:00:00
  Filled 2016-11-03: qty 4

## 2016-11-03 MED ORDER — PROPOFOL 1000 MG/100ML IV EMUL
5.0000 ug/kg/min | INTRAVENOUS | Status: DC
  Administered 2016-11-03 (×3): 40 ug/kg/min via INTRAVENOUS
  Administered 2016-11-04 (×2): 30 ug/kg/min via INTRAVENOUS
  Administered 2016-11-04: 40 ug/kg/min via INTRAVENOUS
  Administered 2016-11-04 – 2016-11-05 (×2): 30 ug/kg/min via INTRAVENOUS
  Administered 2016-11-05 (×2): 25 ug/kg/min via INTRAVENOUS
  Filled 2016-11-03 (×9): qty 100

## 2016-11-03 MED ORDER — VECURONIUM BROMIDE 10 MG IV SOLR
10.0000 mg | Freq: Once | INTRAVENOUS | Status: AC
  Administered 2016-11-03: 10 mg via INTRAVENOUS

## 2016-11-03 MED ORDER — SODIUM CHLORIDE 0.9 % IV SOLN
3.0000 g | Freq: Four times a day (QID) | INTRAVENOUS | Status: DC
  Administered 2016-11-03 – 2016-11-04 (×5): 3 g via INTRAVENOUS
  Filled 2016-11-03 (×9): qty 3

## 2016-11-03 MED ORDER — VECURONIUM BROMIDE 10 MG IV SOLR
INTRAVENOUS | Status: AC
  Administered 2016-11-03: 13:00:00
  Filled 2016-11-03: qty 10

## 2016-11-03 MED ORDER — LORAZEPAM 2 MG/ML IJ SOLN
1.0000 mg | INTRAMUSCULAR | Status: DC | PRN
  Administered 2016-11-03: 1 mg via INTRAVENOUS
  Administered 2016-11-03: 2 mg via INTRAVENOUS
  Administered 2016-11-03: 1 mg via INTRAVENOUS
  Administered 2016-11-03: 2 mg via INTRAVENOUS
  Filled 2016-11-03 (×3): qty 1

## 2016-11-03 MED ORDER — POTASSIUM CHLORIDE 20 MEQ/15ML (10%) PO SOLN
30.0000 meq | Freq: Once | ORAL | Status: AC
  Administered 2016-11-03: 30 meq
  Filled 2016-11-03: qty 22.5

## 2016-11-03 MED ORDER — DEXMEDETOMIDINE HCL IN NACL 400 MCG/100ML IV SOLN: 0.2000 ug/kg/h | INTRAVENOUS | Status: DC

## 2016-11-03 MED ORDER — MIDAZOLAM HCL 2 MG/2ML IJ SOLN
4.0000 mg | Freq: Once | INTRAMUSCULAR | Status: AC
  Administered 2016-11-03: 4 mg via INTRAVENOUS

## 2016-11-03 MED ORDER — FENTANYL 2500MCG IN NS 250ML (10MCG/ML) PREMIX INFUSION
10.0000 ug/h | INTRAVENOUS | Status: DC
  Administered 2016-11-03 (×2): 200 ug/h via INTRAVENOUS
  Administered 2016-11-05: 40 ug/h via INTRAVENOUS
  Filled 2016-11-03 (×3): qty 250

## 2016-11-03 MED ORDER — FAMOTIDINE IN NACL 20-0.9 MG/50ML-% IV SOLN
20.0000 mg | INTRAVENOUS | Status: AC
  Administered 2016-11-03 – 2016-11-04 (×2): 20 mg via INTRAVENOUS
  Filled 2016-11-03 (×2): qty 50

## 2016-11-03 MED ORDER — MIDAZOLAM HCL 2 MG/2ML IJ SOLN
INTRAMUSCULAR | Status: AC
  Administered 2016-11-03: 13:00:00
  Filled 2016-11-03: qty 4

## 2016-11-03 MED ORDER — ENOXAPARIN SODIUM 40 MG/0.4ML ~~LOC~~ SOLN
40.0000 mg | SUBCUTANEOUS | Status: DC
  Administered 2016-11-03 – 2016-11-08 (×6): 40 mg via SUBCUTANEOUS
  Filled 2016-11-03 (×6): qty 0.4

## 2016-11-03 MED ORDER — MAGNESIUM SULFATE 2 GM/50ML IV SOLN
2.0000 g | Freq: Once | INTRAVENOUS | Status: AC
  Administered 2016-11-03: 2 g via INTRAVENOUS
  Filled 2016-11-03: qty 50

## 2016-11-03 NOTE — Procedures (Signed)
Endotracheal Intubation: Patient required placement of an artificial airway secondary to Respiratory Failure  Consent: Emergent.   Hand washing performed prior to starting the procedure.   Medications administered for sedation prior to procedure:  Midazolam 4 mg IV,  Vecuronium 10 mg IV, Fentanyl 100 mcg IV.    A time out procedure was called and correct patient, name, & ID confirmed. Needed supplies and equipment were assembled and checked to include ETT, 10 ml syringe, Glidescope, Mac and Miller blades, suction, oxygen and bag mask valve, end tidal CO2 monitor.   Patient was positioned to align the mouth and pharynx to facilitate visualization of the glottis.   Heart rate, SpO2 and blood pressure was continuously monitored during the procedure. Pre-oxygenation was conducted prior to intubation and endotracheal tube was placed through the vocal cords into the trachea.     The artificial airway was placed under direct visualization via glidescope route using a 8.5 ETT on the first attempt.  ETT was secured at 23 cm mark.  Placement was confirmed by auscuitation of lungs with good breath sounds bilaterally and no stomach sounds.  Condensation was noted on endotracheal tube.   Pulse ox 98%.  CO2 detector in place with appropriate color change.   Complications: None .   Operator: Roylee Chaffin.   Chest radiograph ordered and pending.   Comments: OGT placed via glidescope.  Jamaya Sleeth David Jashad Depaula, M.D.  [facility]  Medical Director [facility] Medical Director [facility] Cardio-Pulmonary Department       

## 2016-11-03 NOTE — Progress Notes (Signed)
[facility] - [place] [facility]   PATIENT NAME: Brett Washington    MR#:  [id number]  DATE OF BIRTH:  05-Sep-1980  SUBJECTIVE:  Patient was very restless and agitated despite being on IV Precedex drip. Unable to protect his airways due to increasing oral secretions. Patient is now intubated and on the ventilator.  REVIEW OF SYSTEMS:   Review of Systems  Constitutional: Negative for chills, fever and weight loss.  HENT: Negative for ear discharge, ear pain and nosebleeds.   Eyes: Negative for blurred vision, pain and discharge.  Respiratory: Negative for sputum production, shortness of breath, wheezing and stridor.   Cardiovascular: Negative for chest pain, palpitations, orthopnea and PND.  Gastrointestinal: Negative for abdominal pain, diarrhea, nausea and vomiting.  Genitourinary: Negative for frequency and urgency.  Musculoskeletal: Negative for back pain and joint pain.  Neurological: Positive for tremors, speech change and weakness. Negative for sensory change and focal weakness.  Psychiatric/Behavioral: Negative for depression and hallucinations. The patient is not nervous/anxious.    Tolerating Diet:Nothing by mouth Tolerating PT: On the ventilator  DRUG ALLERGIES:  No Known Allergies  VITALS:  Blood pressure 128/84, pulse 81, temperature 98.6 F (37 C), resp. rate (!) 26, height 5\' 6"  (1.676 m), weight 83.8 kg (184 lb 11.2 oz), SpO2 94 %.  PHYSICAL EXAMINATION:   Physical Exam  GENERAL:  36 y.o.-year-old patient lying in the bed with no acute distress. Obese EYES: Pupils equal, round, reactive to light and accommodation. No scleral icterus. Extraocular muscles intact.  HEENT: Head atraumatic, normocephalic. Oropharynx and nasopharynx clear. Intubated on the vent  NECK:  Supple, no jugular venous distention. No thyroid enlargement, no tenderness.  LUNGS: Normal breath sounds bilaterally, no wheezing, rales, rhonchi. No use of accessory muscles  of respiration. tachycardia CARDIOVASCULAR: S1, S2 normal. No murmurs, rubs, or gallops.  ABDOMEN: Soft, nontender, nondistended. Bowel sounds present. No organomegaly or mass.  EXTREMITIES: No cyanosis, clubbing or edema b/l.    NEUROLOGIC: Sedated on the ventilator  PSYCHIATRIC: On the ventilator SKIN: No obvious rash, lesion, or ulcer.   LABORATORY PANEL:  CBC  Recent Labs Lab 11/01/16 2101  WBC 2.7*  HGB 14.0  HCT 40.4  PLT 82*    Chemistries   Recent Labs Lab 11/01/16 2101  11/03/16 0523  NA 141  < > 148*  K 2.7*  < > 3.3*  CL 106  < > 114*  CO2 28  < > 26  GLUCOSE 153*  < > 119*  BUN <5*  < > 6  CREATININE <0.30*  < > 0.30*  CALCIUM 9.1  < > 8.9  MG 2.8*  < > 1.7  AST 395*  --   --   ALT 133*  --   --   ALKPHOS 151*  --   --   BILITOT 10.5*  --   --   < > = values in this interval not displayed. Cardiac Enzymes  Recent Labs Lab 10/31/16 1234  TROPONINI <0.03   RADIOLOGY:  Ct Head Wo Contrast  Result Date: 11/01/2016 CLINICAL DATA:  Seizure, alcohol withdrawal EXAM: CT HEAD WITHOUT CONTRAST TECHNIQUE: Contiguous axial images were obtained from the base of the skull through the vertex without intravenous contrast. COMPARISON:  10/31/2016 FINDINGS: Brain: No evidence of acute infarction, hemorrhage, hydrocephalus, extra-axial collection or mass lesion/mass effect. Vascular: No hyperdense vessel or unexpected calcification. Skull: Normal. Negative for fracture or focal lesion. Sinuses/Orbits: No acute finding. Other: None. IMPRESSION: Normal head CT. No  interval change. Electronically Signed   By: Charline BillsSriyesh  Krishnan M.D.   On: 11/01/2016 18:03   Dg Abd Portable 1v  Result Date: 11/02/2016 CLINICAL DATA:  Nasogastric tube placement EXAM: PORTABLE ABDOMEN - 1 VIEW COMPARISON:  None. FINDINGS: Tip and side port of the nasogastric tube are in the stomach. There are multiple gas distended loops of bowel throughout the abdomen. Visualized lung bases are clear.  IMPRESSION: NG tube tip and side-port in the stomach. Numerous loops of gas distended bowel throughout the abdomen. Electronically Signed   By: Deatra RobinsonKevin  Herman M.D.   On: 11/02/2016 00:34   ASSESSMENT AND PLAN:  Brett Washington  is a 36 y.o. male who presents with Seizure episode. Patient was working when he had his seizure. He states that he has had a seizure like this before due to alcohol withdrawal. He states that he drinks about 8 years daily. His alcohol level in the ED was only 6.   * Acute DT's due to severe ETOH withdrawal -pt on CIWA protocol -still tachy and quiet restless despite being on IV Precedex drip. Patient wasn't able to maintain his oral secretions and hence patient is now intubated on the ventilator.  *Severe Alcohol withdrawal  (HCC) - CIWA protocol.  -Per wife patient has been drinking very heavily last 2 years - per wife  he has been an alcoholic since age 36 -Psych consult when appropriate  *Thrombocytopenia (HCC) - potentially related to alcohol related liver dysfunction  * acute Hepatic Transaminitis -due to alcohol hepatitis, although with thrombocytopenia as above this could be early cirrhosis  -Bilirubin 10.6!! 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 She is very tearful and worried. Answered all questions. Wife understands patient is critically ill.   Case discussed with Care Management/Social Worker. Management plans discussed with the patient, family and they are in agreement.  CODE STATUS: FULL  DVT Prophylaxis: lovenox  TOTAL TIME TAKING CARE OF THIS PATIENT: 30 minutes.  >50% time spent on counselling and coordination of care  POSSIBLE D/C IN 2-3 DAYS, DEPENDING ON CLINICAL CONDITION.  Note: This  dictation was prepared with Dragon dictation along with smaller phrase technology. Any transcriptional errors that result from this process are unintentional.  Shamonica Schadt M.D on 11/03/2016 at 1:18 PM  Between 7am to 6pm - Pager - [phone]  After 6pm go to [email or web address] - Social research officer, governmentpassword EPAS [facility]  [facility]  Office  [phone]  CC: Primary care physician; [facility]

## 2016-11-03 NOTE — Consult Note (Signed)
Name: Brett Washington MRN: [id number] DOB: 02/17/1981    ADMISSION DATE:  10/31/2016 CONSULTATION DATE:  11/01/2016  REFERRING MD :  Dr. Allena KatzPatel   CHIEF COMPLAINT:  Altered Mental Status & Delirium Tremens'  BRIEF PATIENT DESCRIPTION:  36 y.o. Hispanic Male with generalized seizure at work on 7/19 likely secondary to ETOH withdrawal, now with severe DT's [facility]  SIGNIFICANT EVENTS  10/31/16>>[facility] 11/01/16>> Transfer to Stepdown unit due to severe DT's  STUDIES:  10/31/16 CT Head>>Brain: No evidence of acute infarction, hemorrhage, hydrocephalus, extra-axial collection or mass lesion/mass effect. Vascular: No hyperdense vessel or unexpected calcification. Skull: Normal. Negative for fracture or focal lesion. Sinuses/Orbits: No acute finding. IMPRESSION: No acute intracranial abnormality noted.    HISTORY OF PRESENT ILLNESS:  Mr. Brett Washington is a 36 y.o. Male with a PMH of Alcohol abuse, seizures, and heart murmur.  [facility] status post generalized seizure at work lasting approximately 2 minutes, with very brief post ictal period.  No trauma was noted.  In ED he was noted to have hypokalemia, thrombocytopenia, and Transaminitis.  [facility]  On 7/20, pt having severe DT's with increased agitation and confusion.  Nursing staff heard a loud thud from the patient's room and it is presumed that he fell in his room. [facility]  PCCM is consulted for further management of severe DT's.  Subjective Patient with severe agitation and severe DTs Patient needs ICU monitoring Patient remains on Precedex infusion Patient is at high risk for intubation Patient remains critically ill  REVIEW OF SYSTEMS:   Unable to assess due to pt confusion and agitation   Temp:  [97.7 F (36.5 C)-99.5 F (37.5 C)] 98.1 F (36.7 C) (07/22 0600) Pulse  Rate:  [68-90] 78 (07/22 0600) Resp:  [18-36] 30 (07/22 0600) BP: (104-173)/(63-159) 136/97 (07/22 0600) SpO2:  [87 %-98 %] 96 % (07/22 0600)  PHYSICAL EXAMINATION: General:  Agitated, restless, Hispanic male, sitting in bed, in distress froim agtation/delerium Neuro:  Confused, poor attention, oriented only to self, PERRLA, CN II-XII intact HEENT:  Atraumatic, Normocephalic, No JVD, Neck supple, Scleral icterus present bilateraly Cardiovascular:  RRR, 3/6 Diastolic Murmur, No rubs or gallops, No peripheral edema Lungs:  Regular, unlabored, Symmetrical expansion, Clear throughout Abdomen:  Soft, nontender, nondistended, Bowel sounds present Musculoskeletal:  Active ROM all extremities, limited range of motion of left upper extremity due to previous work injury, strength 5/5 except in left upper extremity due to injury Skin:  Bruise to right cheek, No lesions, rashes, or ulcerations   Recent Labs Lab 11/01/16 2101 11/02/16 0149 11/03/16 0523  NA 141 143 148*  K 2.7* 3.5 3.3*  CL 106 106 114*  CO2 28 29 26   BUN <5* 5* 6  CREATININE <0.30* 0.37* 0.30*  GLUCOSE 153* 114* 119*    Recent Labs Lab 10/31/16 1234 11/01/16 0439 11/01/16 2101  HGB 13.4 13.5 14.0  HCT 39.5* 38.1* 40.4  WBC 4.7 4.7 2.7*  PLT 87* 77* 82*    ASSESSMENT / PLAN: 36 year old Hispanic male admitted to the intensive care unit for severe decompensation and severe delirium from alcohol which are all with acute metabolic encephalopathy and high risk for intubation   Precedex gtt Geodon IM as needed CIWA protocol Psych consulted, appreciate input Seizure precautions Follow LFT's Continue ICU monitoring High risk for intubation   Critical Care Time  devoted to patient care services described in this note is 35 minutes.   Overall, patient is critically ill, prognosis is guarded.    Lucie Leather, M.D.  [facility]  Medical Director [facility] Medical  Director [facility] Cardio-Pulmonary Department         Wallis Bamberg Santiago Glad, M.D.  [facility] Pulmonary & Critical Care Medicine  Medical Director [facility] Medical Director [facility] Cardio-Pulmonary Department

## 2016-11-03 NOTE — Progress Notes (Signed)
Radiologist called and reports patients newly placed ET tube needs to be pulled back 2-3 cm.  And Central Line needs to be pulled back 5cm due to its termination in the right atria.  Dr.Kasa made aware of both, and RT Jonny Ruizjohn will pull et tube back 3cm.  Dr. Belia HemanKasa declines to pull central line back at this time.

## 2016-11-03 NOTE — Progress Notes (Signed)
Pharmacy Consult for Electrolyte Monitoring  No Known Allergies   Assessment: 36 y/o M admitted with alcohol withdrawal. Pharmacy consulted for oral supplementation of electrolytes  K: 3.3, Mg: 1.7  Plan:  Will give 30mEq KCl per NG and Magnesium 2gm IV. Recheck electrolytes with AM labs.   Patient Measurements: Height: 5\' 6"  (167.6 cm) Weight: 184 lb 11.2 oz (83.8 kg) IBW/kg (Calculated) : 63.8  Vital Signs: Temp: 98.1 F (36.7 C) (07/22 0600) Temp Source: Oral (07/21 2000) BP: 136/97 (07/22 0600) Pulse Rate: 78 (07/22 0600) Intake/Output from previous day: 07/21 0701 - 07/22 0700 In: 1480.7 [I.V.:1480.7] Out: -  Intake/Output from this shift: No intake/output data recorded.  Labs:  Recent Labs  10/31/16 1234  11/01/16 0439 11/01/16 2101 11/02/16 0149 11/03/16 0523  WBC 4.7  --  4.7 2.7*  --   --   HGB 13.4  --  13.5 14.0  --   --   HCT 39.5*  --  38.1* 40.4  --   --   PLT 87*  --  77* 82*  --   --   CREATININE 0.73  --  0.37* <0.30* 0.37* 0.30*  MG  --   < > 1.6* 2.8* 2.3 1.7  PHOS  --   < > 3.0 3.8 3.2 NOT CALCULATED  ALBUMIN 2.9*  --  3.1* 3.1*  --   --   PROT 7.1  --  6.9 6.9  --   --   AST 391*  --  337* 395*  --   --   ALT 127*  --  115* 133*  --   --   ALKPHOS 162*  --  151* 151*  --   --   BILITOT 8.6*  --  10.1* 10.5*  --   --   BILIDIR 4.4*  --   --   --   --   --   IBILI 4.2*  --   --   --   --   --   < > = values in this interval not displayed. Estimated Creatinine Clearance: 132.1 mL/min (A) (by C-G formula based on SCr of 0.3 mg/dL (L)).  Potassium (mmol/L)  Date Value  11/03/2016 3.3 (L)  11/11/2013 3.5   Sodium (mmol/L)  Date Value  11/03/2016 148 (H)  11/11/2013 145   Calcium (mg/dL)  Date Value  16/10/960407/22/2018 8.9   Calcium, Total (mg/dL)  Date Value  54/09/811907/30/2015 8.2 (L)   Garlon HatchetJody Jalan Bodi, PharmD, BCPS Clinical Pharmacist 11/03/2016 7:26 AM

## 2016-11-03 NOTE — Progress Notes (Signed)
ET tube withdrawn 2 cm to 22 cm mark 

## 2016-11-03 NOTE — Progress Notes (Signed)
Chaplain was paged to visit with patient and family due to patient being intubated. Provided spiritual presence and words of encouragement. Offered silent prayer.     11/03/16 1250  Clinical Encounter Type  Visited With Patient;Patient and family together  Visit Type Initial;Spiritual support  Referral From Nurse  Consult/Referral To Chaplain  Spiritual Encounters  Spiritual Needs Other (Comment)

## 2016-11-03 NOTE — Progress Notes (Signed)
Pt found to be attempting to crawl out of bed.  Pt delirious with speech that is incomprehensible.  Pt sating 88%, and breathing over 40 times per minute. Pt has upper airway secretions that are making him cough.  Dr. Belia HemanKasa aware, and reports to prepare for intubation to protect the patients airway.

## 2016-11-03 NOTE — Progress Notes (Signed)
Pt given 4mg  versed, 100mcg fentanyl and 10mg  vecuronium for intubation.  RT john at bedside.  Dr. Belia HemanKAsa to intubate with glidescope.

## 2016-11-03 NOTE — Progress Notes (Signed)
Dr. Belia HemanKasa at bedside to pull Triple lumen central line back.

## 2016-11-03 NOTE — Procedures (Signed)
Central Venous Catheter Placement: Indication: Patient receiving vesicant or irritant drug.; Patient receiving intravenous therapy for longer than 5 days.; Patient has limited or no vascular access.   Consent:emergent    Hand washing performed prior to starting the procedure.   Procedure:   An active timeout was performed and correct patient, name, & ID confirmed.   Patient was positioned correctly for central venous access.  Patient was prepped using strict sterile technique including chlorohexadine preps, sterile drape, sterile gown and sterile gloves.    The area was prepped, draped and anesthetized in the usual sterile manner. Patient comfort was obtained.    A triple lumen catheter was placed in RT  Internal Jugular Vein There was good blood return, catheter caps were placed on lumens, catheter flushed easily, the line was secured and a sterile dressing and BIO-PATCH applied.   Ultrasound was used to visualize vasculature and guidance of needle.   Number of Attempts: 1 Complications:none Estimated Blood Loss: none Chest Radiograph indicated and ordered.  Operator: Yoshiharu Brassell.   Evaline Waltman David Floride Hutmacher, M.D.  [place] [facility]  Medical Director [facility] [place] Medical Director [facility] Cardio-Pulmonary Department     

## 2016-11-03 NOTE — Progress Notes (Addendum)
Pharmacy Antibiotic Note  Brett Washington is a 36 y.o. male admitted on 10/31/2016 with seizure from alcohol withdrawal.  Pharmacy has been consulted for Unasyn dosing. PCT is trending up.   PCT= 1.03 (0.34)  Plan: Unasyn 3 g iv q 6 hours.   Height: 5\' 6"  (167.6 cm) Weight: 184 lb 11.2 oz (83.8 kg) IBW/kg (Calculated) : 63.8  Temp (24hrs), Avg:98.9 F (37.2 C), Min:97.9 F (36.6 C), Max:99.5 F (37.5 C)   Recent Labs Lab 10/31/16 1234 10/31/16 1334 10/31/16 1608 10/31/16 2019 11/01/16 0439 11/01/16 2101 11/02/16 0149 11/03/16 0523  WBC 4.7  --   --   --  4.7 2.7*  --   --   CREATININE 0.73  --   --   --  0.37* <0.30* 0.37* 0.30*  LATICACIDVEN  --  5.4* 2.0* 1.5  --   --   --   --     Estimated Creatinine Clearance: 132.1 mL/min (A) (by C-G formula based on SCr of 0.3 mg/dL (L)).    No Known Allergies  Antimicrobials this admission: Unasyn 7/22 >>  Dose adjustments this admission:   Microbiology results: Sputum: sent  7/20 MRSA PCR: negative  Thank you for allowing pharmacy to be a part of this patient's care.  Luisa HartChristy, Holger Sokolowski D 11/03/2016 1:11 PM

## 2016-11-04 LAB — CBC
HEMATOCRIT: 40.6 % (ref 40.0–52.0)
Hemoglobin: 13.8 g/dL (ref 13.0–18.0)
MCH: 33.7 pg (ref 26.0–34.0)
MCHC: 34 g/dL (ref 32.0–36.0)
MCV: 99.3 fL (ref 80.0–100.0)
Platelets: 127 10*3/uL — ABNORMAL LOW (ref 150–440)
RBC: 4.08 MIL/uL — ABNORMAL LOW (ref 4.40–5.90)
RDW: 17.3 % — AB (ref 11.5–14.5)
WBC: 5 10*3/uL (ref 3.8–10.6)

## 2016-11-04 LAB — COMPREHENSIVE METABOLIC PANEL
ALBUMIN: 2.6 g/dL — AB (ref 3.5–5.0)
ALK PHOS: 127 U/L — AB (ref 38–126)
ALT: 131 U/L — AB (ref 17–63)
AST: 286 U/L — AB (ref 15–41)
Anion gap: 10 (ref 5–15)
BILIRUBIN TOTAL: 10.9 mg/dL — AB (ref 0.3–1.2)
BUN: 17 mg/dL (ref 6–20)
CO2: 26 mmol/L (ref 22–32)
CREATININE: 1.49 mg/dL — AB (ref 0.61–1.24)
Calcium: 9.1 mg/dL (ref 8.9–10.3)
Chloride: 114 mmol/L — ABNORMAL HIGH (ref 101–111)
GFR calc Af Amer: >60 mL/min (ref >60)
GFR, EST NON AFRICAN AMERICAN: 60 mL/min — AB (ref >60)
GLUCOSE: 98 mg/dL (ref 65–99)
POTASSIUM: 3.2 mmol/L — AB (ref 3.5–5.1)
Sodium: 150 mmol/L — ABNORMAL HIGH (ref 135–145)
TOTAL PROTEIN: 6.2 g/dL — AB (ref 6.5–8.1)

## 2016-11-04 LAB — PHOSPHORUS: Phosphorus: 3.7 mg/dL (ref 2.5–4.6)

## 2016-11-04 LAB — GLUCOSE, CAPILLARY
GLUCOSE-CAPILLARY: 97 mg/dL (ref 65–99)
GLUCOSE-CAPILLARY: 92 mg/dL (ref 65–99)
Glucose-Capillary: 112 mg/dL — ABNORMAL HIGH (ref 65–99)
Glucose-Capillary: 86 mg/dL (ref 65–99)

## 2016-11-04 LAB — MAGNESIUM: MAGNESIUM: 2.7 mg/dL — AB (ref 1.7–2.4)

## 2016-11-04 MED ORDER — SODIUM CHLORIDE 0.9 % IV BOLUS (SEPSIS)
1000.0000 mL | Freq: Once | INTRAVENOUS | Status: AC
  Administered 2016-11-04: 1000 mL via INTRAVENOUS

## 2016-11-04 MED ORDER — VITAMIN B-1 100 MG PO TABS
100.0000 mg | ORAL_TABLET | Freq: Every day | ORAL | Status: DC
  Administered 2016-11-05 – 2016-11-06 (×2): 100 mg
  Filled 2016-11-04: qty 1

## 2016-11-04 MED ORDER — ADULT MULTIVITAMIN W/MINERALS CH
1.0000 | ORAL_TABLET | Freq: Every day | ORAL | Status: DC
  Administered 2016-11-05 – 2016-11-06 (×2): 1
  Filled 2016-11-04: qty 1

## 2016-11-04 MED ORDER — SODIUM CHLORIDE 0.9 % IV BOLUS (SEPSIS)
500.0000 mL | Freq: Once | INTRAVENOUS | Status: AC
  Administered 2016-11-04: 500 mL via INTRAVENOUS

## 2016-11-04 MED ORDER — POTASSIUM CHLORIDE 20 MEQ/15ML (10%) PO SOLN
30.0000 meq | Freq: Once | ORAL | Status: AC
  Administered 2016-11-04: 30 meq
  Filled 2016-11-04: qty 22.5

## 2016-11-04 MED ORDER — FOLIC ACID 1 MG PO TABS
1.0000 mg | ORAL_TABLET | Freq: Every day | ORAL | Status: DC
  Administered 2016-11-05 – 2016-11-06 (×2): 1 mg
  Filled 2016-11-04: qty 1

## 2016-11-04 MED ORDER — VITAL AF 1.2 CAL PO LIQD
1000.0000 mL | ORAL | Status: DC
  Administered 2016-11-04: 1000 mL

## 2016-11-04 MED ORDER — PRO-STAT SUGAR FREE PO LIQD
30.0000 mL | Freq: Every day | ORAL | Status: DC
  Administered 2016-11-04: 30 mL

## 2016-11-04 MED ORDER — NOREPINEPHRINE BITARTRATE 1 MG/ML IV SOLN
0.0000 ug/min | INTRAVENOUS | Status: DC
  Administered 2016-11-04: 3 ug/min via INTRAVENOUS
  Filled 2016-11-04: qty 4

## 2016-11-04 MED ORDER — SODIUM CHLORIDE 0.9 % IV BOLUS (SEPSIS): 1000.0000 mL | Freq: Once | INTRAVENOUS | Status: DC

## 2016-11-04 MED ORDER — FAMOTIDINE 20 MG PO TABS
20.0000 mg | ORAL_TABLET | Freq: Two times a day (BID) | ORAL | Status: DC
  Administered 2016-11-05 – 2016-11-06 (×3): 20 mg
  Filled 2016-11-04 (×3): qty 1

## 2016-11-04 MED ORDER — SODIUM CHLORIDE 0.9 % IV SOLN
INTRAVENOUS | Status: DC
  Administered 2016-11-04 – 2016-11-05 (×3): via INTRAVENOUS

## 2016-11-04 MED ORDER — ORAL CARE MOUTH RINSE
15.0000 mL | OROMUCOSAL | Status: DC
  Administered 2016-11-04 – 2016-11-07 (×21): 15 mL via OROMUCOSAL

## 2016-11-04 MED ORDER — SODIUM CHLORIDE 0.9 % IV SOLN
3.0000 g | Freq: Four times a day (QID) | INTRAVENOUS | Status: DC
  Administered 2016-11-04 – 2016-11-07 (×10): 3 g via INTRAVENOUS
  Filled 2016-11-04 (×16): qty 3

## 2016-11-04 MED ORDER — CHLORHEXIDINE GLUCONATE 0.12% ORAL RINSE (MEDLINE KIT)
15.0000 mL | Freq: Two times a day (BID) | OROMUCOSAL | Status: DC
  Administered 2016-11-04 – 2016-11-06 (×5): 15 mL via OROMUCOSAL

## 2016-11-04 MED ORDER — VITAL HIGH PROTEIN PO LIQD: 1000.0000 mL | ORAL | Status: DC

## 2016-11-04 MED ORDER — SODIUM CHLORIDE 0.45 % IV SOLN
INTRAVENOUS | Status: DC
  Administered 2016-11-04: 11:00:00 via INTRAVENOUS

## 2016-11-04 MED ORDER — SODIUM CHLORIDE 0.9% FLUSH
10.0000 mL | INTRAVENOUS | Status: DC | PRN
  Administered 2016-11-06: 10 mL
  Filled 2016-11-04: qty 40

## 2016-11-04 MED ORDER — INSULIN ASPART 100 UNIT/ML ~~LOC~~ SOLN: 0.0000 [IU] | SUBCUTANEOUS | Status: DC

## 2016-11-04 NOTE — Progress Notes (Signed)
Initial Nutrition Assessment  DOCUMENTATION CODES:   Not applicable  INTERVENTION:  Recommend initiating goal tube feeding regimen of Vital AF 1.2 Cal at 55 ml/hr + Pro-Stat 30 ml daily. Goal regimen provides 1684 kcal, 114 grams of protein, 1069 ml H2O daily. Provides 2083 kcal with current propofol rate.  NUTRITION DIAGNOSIS:   Inadequate oral intake related to inability to eat as evidenced by NPO status.  GOAL:   Provide needs based on ASPEN/SCCM guidelines  MONITOR:   Vent status, Labs, Weight trends, TF tolerance, I & O's  REASON FOR ASSESSMENT:   Consult, Ventilator Enteral/tube feeding initiation and management (and Assessment of nutrition requirements/status)  ASSESSMENT:   36 year old male with PMHx of heart murmur, seizures, EtOH abuse (drinks 8 beers/day) who presented after a seizure found to have acute encephalopathy secondary to DTs, elevated liver enzymes. On 7/20 pt was transferred to stepdown unit due to severe DT's requiring Precedex gtt. Pt s/p emergent intubation on 7/22 to protect airways in setting of increasing oral secretions.   Per chart patient was 209 lbs on 07/17/2016 and has lost 24.1 lbs (11.5% body weight) over the past 3 months, which would be significant for time frame. However, weight from 4/4 appears stated and not truly measured, so unsure of accuracy.  Access: OGT with tip in distal stomach/proximal duodenum per chest x-ray on 7/22  Patient is currently intubated on ventilator support MV: 7.9 L/min Temp (24hrs), Avg:98.4 F (36.9 C), Min:97 F (36.1 C), Max:100.2 F (37.9 C)  Propofol: 15.1 ml/hr (399 kcal/day)  Medications reviewed and include: folic acid 1 mg daily, Novolog 0-15 units Q4hrs, lactulose 30 grams BID per tube, MVI daily, thiamine 100 mg daily, 1/2NS @ 100 ml/hr, Unasyn, famotidine, fentanyl gtt, propofol gtt.  Labs reviewed: Sodium 150, Potassium 3.2, Chloride 114, Creatinine 1.49, Magnesium 2.7, Alkaline Phosphatase 127,  AST 286, ALT 131, Total Protein 6.2, T Bili 10.9. Triglycerides 218 on 7/22.  Nutrition-Focused physical exam completed. Findings are no fat depletion, no muscle depletion, and no edema.   Discussed with RN. Pt has been stable on propofol dose since last night.  Diet Order:  Diet regular Room service appropriate? Yes; Fluid consistency: Thin  Skin:  Reviewed, no issues  Last BM:  11/04/2016 - type 7 per rectal tube  Height:   Ht Readings from Last 1 Encounters:  10/31/16 5\' 6"  (1.676 m)    Weight:   Wt Readings from Last 1 Encounters:  10/31/16 184 lb 11.2 oz (83.8 kg)    Ideal Body Weight:  64.5 kg  BMI:  Body mass index is 29.81 kg/m.  Estimated Nutritional Needs:   Kcal:  2015 (PSE w/ MSJ 1722, Ve 7.9, Tmax 37.9)  Protein:  100-117 grams (1.2-1.4 grams/kg)  Fluid:  2-2.2 L/day (30-35 ml/kg IBW)  EDUCATION NEEDS:   No education needs identified at this time  Helane RimaLeanne Jann Ra, MS, RD, LDN Pager: [phone] After Hours Pager: [phone]

## 2016-11-04 NOTE — Consult Note (Signed)
Name: Brett Washington MRN: [id number] DOB: 02-01-1981    ADMISSION DATE:    10/31/2016   BRIEF PATIENT DESCRIPTION:  36 y.o. Hispanic Male with generalized seizure at work on 7/19 likely secondary to ETOH withdrawal, now with severe DT's requiring transfer to [facility] unit for Precedex gtt  SIGNIFICANT EVENTS  10/31/16>>Admission to [facility] Med/surg unit 11/01/16>> Transfer to Stepdown unit due to severe DT's  STUDIES:  10/31/16 CT Head>>Brain: No evidence of acute infarction, hemorrhage, hydrocephalus, extra-axial collection or mass lesion/mass effect. Vascular: No hyperdense vessel or unexpected calcification. Skull: Normal. Negative for fracture or focal lesion. Sinuses/Orbits: No acute finding. IMPRESSION: No acute intracranial abnormality noted.    Subjective Patient remains on the ventilator, is intubated, sedated, cannot provide history or review of systems.  REVIEW OF SYSTEMS:   Unable to assess due to pt confusion and agitation   Temp:  [97 F (36.1 C)-100.2 F (37.9 C)] 100.2 F (37.9 C) (07/23 0400) Pulse Rate:  [63-82] 71 (07/23 0400) Resp:  [16-33] 17 (07/23 0400) BP: (78-132)/(48-94) 79/51 (07/23 0300) SpO2:  [92 %-98 %] 97 % (07/23 0400) FiO2 (%):  [40 %] 40 % (07/23 0819)  PHYSICAL EXAMINATION: General:  Sedated. Neuro: Sedated. HEENT:  Atraumatic, Normocephalic, No JVD, Neck supple, Scleral icterus present bilateraly Cardiovascular:  RRR, 3/6 Diastolic Murmur, No rubs or gallops, No peripheral edema Lungs:  Remains Regular, unlabored, Symmetrical expansion, Clear throughout Abdomen:  Soft, nontender, nondistended, Bowel sounds present Musculoskeletal:  Active ROM all extremities, limited range of motion of left upper extremity due to previous work injury, strength 5/5 except in left upper extremity due to injury Skin:  Bruise to right cheek, No lesions, rashes, or ulcerations   Recent Labs Lab 11/02/16 0149 11/03/16 0523 11/04/16 0509  NA 143 148* 150*   K 3.5 3.3* 3.2*  CL 106 114* 114*  CO2 29 26 26   BUN 5* 6 17  CREATININE 0.37* 0.30* 1.49*  GLUCOSE 114* 119* 98    Recent Labs Lab 11/01/16 0439 11/01/16 2101 11/04/16 0509  HGB 13.5 14.0 13.8  HCT 38.1* 40.4 40.6  WBC 4.7 2.7* 5.0  PLT 77* 82* 127*    ASSESSMENT / PLAN: 36 year old Hispanic male admitted to the intensive care unit for severe decompensation and severe delirium from alcohol Withdrawal with acute metabolic encephalopathy, now intubated secondary to above.   Continue current vent settings, reviewed and adjusted as needed. No weaning today secondary to severe withdrawal symptoms, we'll reassess. Continue sedation. CIWA protocol Psych consulted, appreciate input Seizure precautions Follow LFT's Continue ICU monitoring      Overall, patient is critically ill, prognosis is guarded.    Wells Guiles, MD.   Board Certified in Internal Medicine, Pulmonary Medicine, Critical Care Medicine, and Sleep Medicine.  [facility] Office Number: [phone] Pager: [phone]  Santiago Glad, M.D.  Billy Fischer, M.D    Critical Care Attestation.  I have personally obtained a history, examined the patient, evaluated laboratory and imaging results, formulated the assessment and plan and placed orders. The Patient requires high complexity decision making for assessment and support, frequent evaluation and titration of therapies, application of advanced monitoring technologies and extensive interpretation of multiple databases. The patient has critical illness that could lead imminently to failure of 1 or more organ systems and requires the highest level of physician preparedness to intervene.  Critical Care Time devoted to patient care services described in this note is 30 minutes and is exclusive of time spent in procedures supervisory  time of NP.

## 2016-11-04 NOTE — Progress Notes (Signed)
CONCERNING: IV to Oral Route Change Policy  RECOMMENDATION: This patient is receiving famotidine by the intravenous route.  Based on criteria approved by the Pharmacy and Therapeutics Committee, the intravenous medication(s) is/are being converted to the equivalent oral dose form(s).   DESCRIPTION: These criteria include:  The patient is eating (either orally or via tube) and/or has been taking other orally administered medications for a least 24 hours  The patient has no evidence of active gastrointestinal bleeding or impaired GI absorption (gastrectomy, short bowel, patient on TNA or NPO).  If you have questions about this conversion, please contact the Pharmacy Department  []   [phone]( [phone] )  [facility] [x]   [phone]( [phone] )  [facility] []   [phone]( [phone]  [facility] []   [phone]( [phone] )  [facility] []   [phone]( [phone] )  [facility]   Simpson,Michael L, [facility] ManagementRPH 11/04/2016 3:56 PM

## 2016-11-04 NOTE — Progress Notes (Signed)
[facility] [place] [facility]   PATIENT NAME: Brett Washington    MR#:  [id number]  DATE OF BIRTH:  12/24/1980  SUBJECTIVE:  Critically ill and on the ventilator Rectal tube due to diarrhea from lactulose  REVIEW OF SYSTEMS:   Review of Systems  Constitutional: Negative for chills, fever and weight loss.  HENT: Negative for ear discharge, ear pain and nosebleeds.   Eyes: Negative for blurred vision, pain and discharge.  Respiratory: Negative for sputum production, shortness of breath, wheezing and stridor.   Cardiovascular: Negative for chest pain, palpitations, orthopnea and PND.  Gastrointestinal: Negative for abdominal pain, diarrhea, nausea and vomiting.  Genitourinary: Negative for frequency and urgency.  Musculoskeletal: Negative for back pain and joint pain.  Neurological: Positive for tremors, speech change and weakness. Negative for sensory change and focal weakness.  Psychiatric/Behavioral: Negative for depression and hallucinations. The patient is not nervous/anxious.    Tolerating Diet:Nothing by mouth Tolerating PT: On the ventilator  DRUG ALLERGIES:  No Known Allergies  VITALS:  Blood pressure (!) 85/44, pulse 71, temperature 97.9 F (36.6 C), temperature source Oral, resp. rate (!) 21, height 5\' 6"  (1.676 m), weight 83.8 kg (184 lb 11.2 oz), SpO2 100 %.  PHYSICAL EXAMINATION:   Physical Exam  GENERAL:  36 y.o.-year-old patient lying in the bed with no acute distress. Obese, critically ill EYES: Pupils equal, round, reactive to light and accommodation. No scleral icterus. Extraocular muscles intact.  HEENT: Head atraumatic, normocephalic. Oropharynx and nasopharynx clear. Intubated on the vent  NECK:  Supple, no jugular venous distention. No thyroid enlargement, no tenderness.  LUNGS: Normal breath sounds bilaterally, no wheezing, rales, rhonchi. No use of accessory muscles of respiration. tachycardia CARDIOVASCULAR: S1, S2 normal.  No murmurs, rubs, or gallops.  ABDOMEN: Soft, nontender, nondistended. Bowel sounds present. No organomegaly or mass. Rectal tube EXTREMITIES: No cyanosis, clubbing or edema b/l.    NEUROLOGIC: Sedated on the ventilator  PSYCHIATRIC: On the ventilator SKIN: No obvious rash, lesion, or ulcer.   LABORATORY PANEL:  CBC  Recent Labs Lab 11/04/16 0509  WBC 5.0  HGB 13.8  HCT 40.6  PLT 127*    Chemistries   Recent Labs Lab 11/04/16 0509  NA 150*  K 3.2*  CL 114*  CO2 26  GLUCOSE 98  BUN 17  CREATININE 1.49*  CALCIUM 9.1  MG 2.7*  AST 286*  ALT 131*  ALKPHOS 127*  BILITOT 10.9*   Cardiac Enzymes  Recent Labs Lab 10/31/16 1234  TROPONINI <0.03   RADIOLOGY:  Dg Chest Port 1 View  Result Date: 11/03/2016 CLINICAL DATA:  Central line placement. EXAM: PORTABLE CHEST 1 VIEW COMPARISON:  07/06/2013 FINDINGS: Endotracheal tube has tip at the origin of the right mainstem bronchus. Nasogastric tube courses into the stomach with tip just right of midline likely over the distal stomach/ proximal duodenum. Right IJ central venous catheter is present with tip over the right atrium as this could be pulled back approximately 5 cm. Lungs are hypoinflated with right base opacification which may represent atelectasis versus pneumonia. No evidence pneumothorax. Subtle prominence of the perihilar markings with borderline cardiomegaly. Remainder the exam is unchanged. IMPRESSION: Right base airspace opacification which may be due to atelectasis or pneumonia. Borderline cardiomegaly with suggestion of mild vascular congestion. Tubes and lines as described. Note that the right IJ central venous catheter has tip in the right atrium and could be pulled back approximately 5 cm. Endotracheal tube has tip at the origin  of the right mainstem bronchus. These results were called by telephone at the time of interpretation on 11/03/2016 at 1:35 pm to patient's nurse, Madelaine Bhat, who verbally acknowledged these  results and states he will notify patient's physician. Electronically Signed   By: Elberta Fortis M.D.   On: 11/03/2016 13:36   ASSESSMENT AND PLAN:  Brett Washington  is a 36 y.o. male who presents with Seizure episode. Patient was working when he had his seizure. He states that he has had a seizure like this before due to alcohol withdrawal. He states that he drinks about 8 years daily. His alcohol level in the ED was only 6.   * Acute DT's due to severe ETOH withdrawal -pt on CIWA protocol -still tachy and quiet restless despite being on IV Precedex drip. Patient wasn't able to maintain his oral secretions and hence patient is now intubated on the ventilator. -Per wife patient has been drinking very heavily last 2 years - per wife  he has been an alcoholic since age 74 -Psych consult when appropriate  * Sepsis  Tachycardia,fever and CXR right LL pneumonia -likley aspiration pneumonia -IV Unasyn  *Thrombocytopenia (HCC) - potentially related to alcohol related liver dysfunction  * acute Hepatic Transaminitis -due to alcohol hepatitis, although with thrombocytopenia as above this could be early cirrhosis  -Bilirubin 10.6!! 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   Case discussed with Care Management/Social Worker. Management plans discussed with the patient, family and they are in agreement.  CODE STATUS: FULL  DVT Prophylaxis: lovenox  TOTAL TIME TAKING CARE OF THIS PATIENT: 30 minutes.  >50% time spent on counselling and coordination of care  POSSIBLE D/C IN several DAYS, DEPENDING ON CLINICAL CONDITION.  Note: This dictation was prepared with Dragon dictation along with smaller phrase technology. Any transcriptional errors that  result from this process are unintentional.  Cornelis Kluver M.D on 11/04/2016 at 6:01 PM  Between 7am to 6pm - Pager - [phone]  After 6pm go to [email or web address] - Social research officer, government  [facility]  Office  [phone]  CC: Primary care physician; Center, [facility]

## 2016-11-04 NOTE — Care Management (Signed)
[facility] [phone]. Follow up appointment made on Aug. 3 at 10AM. Patient does not have health insurance.

## 2016-11-04 NOTE — Progress Notes (Signed)
Pharmacy Consult for Electrolyte Monitoring  Pharmacy consulted for electrolyte management for 36 yo male requiring mechanical intubation secondary to alcohol withdrawal and seizures.    Plan:  Will order potassium 30mEq VT x 2 and recheck electrolytes with am labs. In setting of seizures goal magnesium > 2.   No Known Allergies  Patient Measurements: Height: 5\' 6"  (167.6 cm) Weight: 184 lb 11.2 oz (83.8 kg) IBW/kg (Calculated) : 63.8  Vital Signs: Temp: 97.9 F (36.6 C) (07/23 1600) Temp Source: Oral (07/23 1600) BP: 81/51 (07/23 1600) Pulse Rate: 65 (07/23 1600) Intake/Output from previous day: 07/22 0701 - 07/23 0700 In: -  Out: 375 [Urine:375] Intake/Output from this shift: Total I/O In: 2238.6 [I.V.:1532.3; NG/GT:106.3; IV Piggyback:600] Out: 1145 [Urine:245; Stool:900]  Labs:  Recent Labs  11/01/16 2101 11/02/16 0149 11/03/16 0523 11/04/16 0509  WBC 2.7*  --   --  5.0  HGB 14.0  --   --  13.8  HCT 40.4  --   --  40.6  PLT 82*  --   --  127*  CREATININE <0.30* 0.37* 0.30* 1.49*  MG 2.8* 2.3 1.7 2.7*  PHOS 3.8 3.2 NOT CALCULATED 3.7  ALBUMIN 3.1*  --   --  2.6*  PROT 6.9  --   --  6.2*  AST 395*  --   --  286*  ALT 133*  --   --  131*  ALKPHOS 151*  --   --  127*  BILITOT 10.5*  --   --  10.9*   Estimated Creatinine Clearance: 70.9 mL/min (A) (by C-G formula based on SCr of 1.49 mg/dL (H)).  Potassium (mmol/L)  Date Value  11/04/2016 3.2 (L)  11/11/2013 3.5   Sodium (mmol/L)  Date Value  11/04/2016 150 (H)  11/11/2013 145   Calcium (mg/dL)  Date Value  16/10/960407/23/2018 9.1   Calcium, Total (mg/dL)  Date Value  54/09/811907/30/2015 8.2 (L)    Pharmacy will continue to monitor and adjust per consult.   MLS 11/02/2016,3:25 AM

## 2016-11-04 NOTE — Progress Notes (Signed)
Patient's BP and UOP low, total of 2.5 L IVF bolus given throughout shift per Dr. Nicholos Johnsamachandran. UOP and BP increased slightly, will continue to monitor. Currently BP stable with maintenance fluids at 100 mL/hr. Patient had slight temp at beginning of shift which resolved after ibuprofen given. Trudee KusterBrandi R Mansfield

## 2016-11-05 ENCOUNTER — Inpatient Hospital Stay: Payer: Self-pay

## 2016-11-05 LAB — BLOOD GAS, ARTERIAL
ACID-BASE DEFICIT: 6.8 mmol/L — AB (ref 0.0–2.0)
BICARBONATE: 20.2 mmol/L (ref 20.0–28.0)
FIO2: 0.4
MECHVT: 500 mL
Mechanical Rate: 16
O2 Saturation: 97.6 %
PATIENT TEMPERATURE: 37
PEEP/CPAP: 5 cmH2O
PO2 ART: 112 mmHg — AB (ref 83.0–108.0)
pCO2 arterial: 45 mmHg (ref 32.0–48.0)
pH, Arterial: 7.26 — ABNORMAL LOW (ref 7.350–7.450)

## 2016-11-05 LAB — COMPREHENSIVE METABOLIC PANEL
ALT: 103 U/L — ABNORMAL HIGH (ref 17–63)
ANION GAP: 6 (ref 5–15)
AST: 208 U/L — ABNORMAL HIGH (ref 15–41)
Albumin: 2.2 g/dL — ABNORMAL LOW (ref 3.5–5.0)
Alkaline Phosphatase: 127 U/L — ABNORMAL HIGH (ref 38–126)
BUN: 21 mg/dL — ABNORMAL HIGH (ref 6–20)
CHLORIDE: 127 mmol/L — AB (ref 101–111)
CO2: 20 mmol/L — ABNORMAL LOW (ref 22–32)
Calcium: 8.6 mg/dL — ABNORMAL LOW (ref 8.9–10.3)
Creatinine, Ser: 1 mg/dL (ref 0.61–1.24)
Glucose, Bld: 125 mg/dL — ABNORMAL HIGH (ref 65–99)
POTASSIUM: 3.4 mmol/L — AB (ref 3.5–5.1)
Sodium: 153 mmol/L — ABNORMAL HIGH (ref 135–145)
Total Bilirubin: 8.3 mg/dL — ABNORMAL HIGH (ref 0.3–1.2)
Total Protein: 5.8 g/dL — ABNORMAL LOW (ref 6.5–8.1)

## 2016-11-05 LAB — CBC
HCT: 39.2 % — ABNORMAL LOW (ref 40.0–52.0)
Hemoglobin: 13 g/dL (ref 13.0–18.0)
MCH: 34.5 pg — AB (ref 26.0–34.0)
MCHC: 33.3 g/dL (ref 32.0–36.0)
MCV: 103.7 fL — AB (ref 80.0–100.0)
PLATELETS: 121 10*3/uL — AB (ref 150–440)
RBC: 3.77 MIL/uL — ABNORMAL LOW (ref 4.40–5.90)
RDW: 18.4 % — AB (ref 11.5–14.5)
WBC: 5 10*3/uL (ref 3.8–10.6)

## 2016-11-05 LAB — GLUCOSE, CAPILLARY
GLUCOSE-CAPILLARY: 109 mg/dL — AB (ref 65–99)
Glucose-Capillary: 90 mg/dL (ref 65–99)
Glucose-Capillary: 109 mg/dL — ABNORMAL HIGH (ref 65–99)
Glucose-Capillary: 110 mg/dL — ABNORMAL HIGH (ref 65–99)
Glucose-Capillary: 82 mg/dL (ref 65–99)

## 2016-11-05 LAB — MAGNESIUM: Magnesium: 2.2 mg/dL (ref 1.7–2.4)

## 2016-11-05 LAB — AMMONIA: Ammonia: 69 umol/L — ABNORMAL HIGH (ref 9–35)

## 2016-11-05 MED ORDER — SODIUM BICARBONATE 8.4 % IV SOLN
50.0000 meq | Freq: Once | INTRAVENOUS | Status: AC
Start: 2016-11-05 — End: 2016-11-05
  Administered 2016-11-05: 50 meq via INTRAVENOUS
  Filled 2016-11-05: qty 50

## 2016-11-05 MED ORDER — DEXTROSE 5 % IV SOLN
INTRAVENOUS | Status: DC
  Administered 2016-11-05 – 2016-11-07 (×5): via INTRAVENOUS

## 2016-11-05 MED ORDER — DEXMEDETOMIDINE HCL IN NACL 400 MCG/100ML IV SOLN
0.4000 ug/kg/h | INTRAVENOUS | Status: DC
  Administered 2016-11-05: 0.6 ug/kg/h via INTRAVENOUS

## 2016-11-05 MED ORDER — POTASSIUM CHLORIDE 10 MEQ/50ML IV SOLN
10.0000 meq | INTRAVENOUS | Status: AC
  Administered 2016-11-05 (×3): 10 meq via INTRAVENOUS
  Filled 2016-11-05 (×3): qty 50

## 2016-11-05 NOTE — Progress Notes (Signed)
Chaplain visited with patient, whom is intubated, and son. Provided words of encouragement and ministry of presence.     11/05/16 1615  Clinical Encounter Type  Visited With Patient;Patient and family together  Visit Type Follow-up;Spiritual support  Referral From Chaplain  Consult/Referral To Chaplain  Spiritual Encounters  Spiritual Needs Other (Comment)

## 2016-11-05 NOTE — Progress Notes (Signed)
Per Dr. Nicholos Johnsamachandran okay to extubate patient. Patient extubated by RRT to [place]. Patient is still drowsy, but easily aroused and following commands. Will continue to assess. Trudee KusterBrandi R Mansfield

## 2016-11-05 NOTE — Progress Notes (Addendum)
Pharmacy Consult for Electrolyte Monitoring  Pharmacy consulted for electrolyte management for 36 yo male requiring mechanical intubation secondary to alcohol withdrawal and seizures.    Plan:  7/24 AM: K+ 3.4, Mg: 2.2  KCL 10mEq IV x 3 ordered. No additional supplementation at this time. Will recheck electrolytes with am labs.  In setting of seizures goal magnesium > 2.   No Known Allergies  Patient Measurements: Height: 5\' 6"  (167.6 cm) Weight: 191 lb 9.3 oz (86.9 kg) IBW/kg (Calculated) : 63.8  Vital Signs: Temp: 99.4 F (37.4 C) (07/24 0800) Temp Source: Oral (07/24 0800) BP: 107/67 (07/24 0600) Pulse Rate: 104 (07/24 0731) Intake/Output from previous day: 07/23 0701 - 07/24 0700 In: 4716.4 [I.V.:3240.1; NG/GT:876.3; IV Piggyback:600] Out: 3185 [Urine:985; Stool:2200] Intake/Output from this shift: No intake/output data recorded.  Labs:  Recent Labs  11/03/16 0523 11/04/16 0509 11/05/16 0444  WBC  --  5.0 5.0  HGB  --  13.8 13.0  HCT  --  40.6 39.2*  PLT  --  127* 121*  CREATININE 0.30* 1.49* 1.00  MG 1.7 2.7* 2.2  PHOS NOT CALCULATED 3.7  --   ALBUMIN  --  2.6* 2.2*  PROT  --  6.2* 5.8*  AST  --  286* 208*  ALT  --  131* 103*  ALKPHOS  --  127* 127*  BILITOT  --  10.9* 8.3*   Estimated Creatinine Clearance: 107.5 mL/min (by C-G formula based on SCr of 1 mg/dL).  Potassium (mmol/L)  Date Value  11/05/2016 3.4 (L)  11/11/2013 3.5   Sodium (mmol/L)  Date Value  11/05/2016 153 (H)  11/11/2013 145   Calcium (mg/dL)  Date Value  40/98/119107/24/2018 8.6 (L)   Calcium, Total (mg/dL)  Date Value  47/82/956207/30/2015 8.2 (L)    Pharmacy will continue to monitor and adjust per consult.   Gardner CandleSheema M Durenda Pechacek, PharmD, BCPS Clinical Pharmacist 11/05/2016 9:00 AM

## 2016-11-05 NOTE — Progress Notes (Signed)
[facility] [place] [facility]   PATIENT NAME: Brett Washington    MR#:  [id number]  DATE OF BIRTH:  09/06/1980  SUBJECTIVE:  Critically ill and Extubated today Rectal tube due to diarrhea from lactulose  Son and wife in the room Patient on Precedex drip  REVIEW OF SYSTEMS:   ROS Tolerating Diet:Nothing by mouth Tolerating PT: On the ventilator  DRUG ALLERGIES:  No Known Allergies  VITALS:  Blood pressure 129/85, pulse 85, temperature 98.4 F (36.9 C), temperature source Oral, resp. rate 16, height 5\' 6"  (1.676 m), weight 86.9 kg (191 lb 9.3 oz), SpO2 98 %.  PHYSICAL EXAMINATION:   Physical Exam  GENERAL:  36 y.o.-year-old patient lying in the bed with no acute distress. Obese, critically ill EYES: Pupils equal, round, reactive to light and accommodation. No scleral icterus. Extraocular muscles intact.  HEENT: Head atraumatic, normocephalic. Oropharynx and nasopharynx clear. Intubated on the vent  NECK:  Supple, no jugular venous distention. No thyroid enlargement, no tenderness.  LUNGS: Normal breath sounds bilaterally, no wheezing, rales, rhonchi. No use of accessory muscles of respiration. tachycardia CARDIOVASCULAR: S1, S2 normal. No murmurs, rubs, or gallops.  ABDOMEN: Soft, nontender, nondistended. Bowel sounds present. No organomegaly or mass. Rectal tube EXTREMITIES: No cyanosis, clubbing or edema b/l.    NEUROLOGIC: Sedated on the ventilator  PSYCHIATRIC: On the ventilator SKIN: No obvious rash, lesion, or ulcer.   LABORATORY PANEL:  CBC  Recent Labs Lab 11/05/16 0444  WBC 5.0  HGB 13.0  HCT 39.2*  PLT 121*    Chemistries   Recent Labs Lab 11/05/16 0444  NA 153*  K 3.4*  CL 127*  CO2 20*  GLUCOSE 125*  BUN 21*  CREATININE 1.00  CALCIUM 8.6*  MG 2.2  AST 208*  ALT 103*  ALKPHOS 127*  BILITOT 8.3*   Cardiac Enzymes  Recent Labs Lab 10/31/16 1234  TROPONINI <0.03   RADIOLOGY:  Dg Chest 1 View  Result  Date: 11/05/2016 CLINICAL DATA:  Shortness of breath. EXAM: CHEST 1 VIEW COMPARISON:  11/03/2016. FINDINGS: Endotracheal tube, NG tube, right IJ line stable position. Heart size stable. Mild bibasilar atelectasis and infiltrates again noted, slight interim improvement from prior exam. No prominent pleural effusion. No pneumothorax. IMPRESSION: 1. Lines and tubes in stable position. 2. Mild bibasilar atelectasis and infiltrates again noted with slight interim improvement from prior exam. Electronically Signed   By: Maisie Fushomas  Register   On: 11/05/2016 06:32   ASSESSMENT AND PLAN:  Brett FinerJose Supan  is a 36 y.o. male who presents with Seizure episode. Patient was working when he had his seizure. He states that he has had a seizure like this before due to alcohol withdrawal. He states that he drinks about 8 years daily. His alcohol level in the ED was only 6.   * Acute DT's due to severe ETOH withdrawal -pt on CIWA protocol -still tachy and quiet restless despite being on IV Precedex drip. Patient wasn't able to maintain his oral secretions and hence patient is now intubated on the ventilator----now extubated and on Precedex drip -Per wife patient has been drinking very heavily last 2 years - per wife  he has been an alcoholic since age 36 -Psych consult when appropriate  * Sepsis  Tachycardia,fever and CXR right LL pneumonia -likley aspiration pneumonia -IV Unasyn  *Thrombocytopenia (HCC) - potentially related to alcohol related liver dysfunction  *Acute hypernatremia, hyperchloremia suspected due to GI losses -Patient getting lactulose for hyperammonemia--- hold lactulose -  IV dextrose with water -Monitor I's and O's and metabolic panel.  * acute Hepatic Transaminitis -due to alcohol hepatitis, although with thrombocytopenia as above this could be early cirrhosis  -Bilirubin 10.6!! 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   Case discussed with Care Management/Social Worker. Management plans discussed with the patient, family and they are in agreement.  CODE STATUS: FULL  DVT Prophylaxis: lovenox  TOTAL TIME TAKING CARE OF THIS PATIENT: 30 minutes.  >50% time spent on counselling and coordination of care  POSSIBLE D/C IN several DAYS, DEPENDING ON CLINICAL CONDITION.  Note: This dictation was prepared with Dragon dictation along with smaller phrase technology. Any transcriptional errors that result from this process are unintentional.  Shiraz Bastyr M.D on 11/05/2016 at 3:30 PM  Between 7am to 6pm - Pager - [phone]  After 6pm go to [email or web address] - Social research officer, government  [facility] [place]  Office  [phone]  CC: Primary care physician; Center, [facility]

## 2016-11-05 NOTE — Progress Notes (Signed)
Patient extubated today, currently on 2L without any complications. Patient did become combative around 1200, and was placed on precedex. Currently calm, but drowsy. Vss, will continue to assess. Trudee KusterBrandi R Mansfield

## 2016-11-05 NOTE — Consult Note (Signed)
Name: Horton FinerJose Seville MRN: [id number] DOB: 01/09/1981    ADMISSION DATE:  10/31/2016   BRIEF PATIENT DESCRIPTION:  36 y.o. Hispanic Male with generalized seizure at work on 7/19 likely secondary to ETOH withdrawal, now with severe [facility]'s [facility]  SIGNIFICANT EVENTS  10/31/16>>[facility] 11/01/16>> Transfer to Stepdown unit due to severe DT's  STUDIES:  10/31/16 CT Head>>Brain: No evidence of acute infarction, hemorrhage, hydrocephalus, extra-axial collection or mass lesion/mass effect. Vascular: No hyperdense vessel or unexpected calcification. Skull: Normal. Negative for fracture or focal lesion. Sinuses/Orbits: No acute finding. IMPRESSION: No acute intracranial abnormality noted.    Subjective Patient remains on the ventilator, is intubated, sedated, cannot provide history or review of systems. Some low BP yesterday.  REVIEW OF SYSTEMS:   Unable to assess due to pt confusion and agitation   Temp:  [97.8 F (36.6 C)-99.9 F (37.7 C)] 99.4 F (37.4 C) (07/24 0800) Pulse Rate:  [61-104] 104 (07/24 0731) Resp:  [14-38] 17 (07/24 0731) BP: (69-116)/(40-85) 107/67 (07/24 0600) SpO2:  [92 %-100 %] 98 % (07/24 0731) FiO2 (%):  [35 %-40 %] 35 % (07/24 0849) Weight:  [191 lb 9.3 oz (86.9 kg)] 191 lb 9.3 oz (86.9 kg) (07/24 0500)  PHYSICAL EXAMINATION: General:  Sedated. Neuro: remains Sedated. HEENT:  Still Atraumatic, Normocephalic, No JVD, Neck supple, Scleral icterus present bilateraly Cardiovascular:  Still RRR, 3/6 Diastolic Murmur, No rubs or gallops, No peripheral edema Lungs:  Still  Regular, unlabored, Symmetrical expansion, Clear throughout Abdomen:  Soft, nontender, nondistended, Bowel sounds present Musculoskeletal:  Active ROM all extremities, limited range of motion of left upper extremity due to previous work injury, strength 5/5 except in left upper extremity due to injury Skin:  Bruise to right  cheek, No lesions, rashes, or ulcerations   Recent Labs Lab 11/03/16 0523 11/04/16 0509 11/05/16 0444  NA 148* 150* 153*  K 3.3* 3.2* 3.4*  CL 114* 114* 127*  CO2 26 26 20*  BUN 6 17 21*  CREATININE 0.30* 1.49* 1.00  GLUCOSE 119* 98 125*    Recent Labs Lab 11/01/16 2101 11/04/16 0509 11/05/16 0444  HGB 14.0 13.8 13.0  HCT 40.4 40.6 39.2*  WBC 2.7* 5.0 5.0  PLT 82* 127* 121*    ASSESSMENT / PLAN: 36 year old Hispanic male admitted to the intensive care unit for severe decompensation and severe delirium from alcohol Withdrawal with acute metabolic encephalopathy, now intubated secondary to above. Hypernatremia, changed to D5.  Continue current vent settings, reviewed and adjusted as needed. Start vent weaning today, consider extubation tolerated. Continue sedation. CIWA protocol Seizure precautions Follow LFT's Continue ICU monitoring      Overall, patient is critically ill, prognosis is guarded.    Wells Guileseep Devonne Kitchen, MD.   Board Certified in Internal Medicine, Pulmonary Medicine, Critical Care Medicine, and Sleep Medicine.  [facility] Office Number: [phone] Pager: [phone]  Santiago Gladavid Kasa, M.D.  Billy Fischeravid Simonds, M.D    Critical Care Attestation.  I have personally obtained a history, examined the patient, evaluated laboratory and imaging results, formulated the assessment and plan and placed orders. The Patient requires high complexity decision making for assessment and support, frequent evaluation and titration of therapies, application of advanced monitoring technologies and extensive interpretation of multiple databases. The patient has critical illness that could lead imminently to failure of 1 or more organ systems and requires the highest level of physician preparedness to intervene.  Critical Care Time  devoted to patient care services described in this note is 33 minutes and is exclusive of time spent in procedures  supervisory time of NP.

## 2016-11-05 NOTE — Progress Notes (Addendum)
Pharmacy Antibiotic Note  Brett Washington is a 36 y.o. male admitted on 10/31/2016 with seizure from alcohol withdrawal.  Pharmacy has been consulted for Unasyn dosing. .   7/22 PCT= 1.03 (0.34)  Plan: Day 3 IV Abx. Continue Unasyn 3 g iv q 6 hours. Stop date for 7/28.  Height: 5\' 6"  (167.6 cm) Weight: 191 lb 9.3 oz (86.9 kg) IBW/kg (Calculated) : 63.8  Temp (24hrs), Avg:98.3 F (36.8 C), Min:97.8 F (36.6 C), Max:99.4 F (37.4 C)   Recent Labs Lab 10/31/16 1234 10/31/16 1334 10/31/16 1608 10/31/16 2019 11/01/16 0439 11/01/16 2101 11/02/16 0149 11/03/16 0523 11/04/16 0509 11/05/16 0444  WBC 4.7  --   --   --  4.7 2.7*  --   --  5.0 5.0  CREATININE 0.73  --   --   --  0.37* <0.30* 0.37* 0.30* 1.49* 1.00  LATICACIDVEN  --  5.4* 2.0* 1.5  --   --   --   --   --   --     Estimated Creatinine Clearance: 107.5 mL/min (by C-G formula based on SCr of 1 mg/dL).    No Known Allergies  Antimicrobials this admission: Unasyn 7/22 >>  Dose adjustments this admission:   Microbiology results: Sputum: sent  7/20 MRSA PCR: negative  Thank you for allowing pharmacy to be a part of this patient's care.  Gardner CandleSheema M Joseluis Alessio, PharmD, BCPS Clinical Pharmacist 11/05/2016 1:28 PM

## 2016-11-05 NOTE — Progress Notes (Signed)
Pt extubated today 11/05/16 at 0940.  Pt tolerated well and is stable and in no apparent distress. Sats 99 RR 28

## 2016-11-06 ENCOUNTER — Encounter: Payer: Self-pay | Admitting: Psychiatry

## 2016-11-06 DIAGNOSIS — F1020 Alcohol dependence, uncomplicated: Secondary | ICD-10-CM

## 2016-11-06 LAB — BASIC METABOLIC PANEL
Anion gap: 6 (ref 5–15)
BUN: 7 mg/dL (ref 6–20)
CHLORIDE: 112 mmol/L — AB (ref 101–111)
CO2: 28 mmol/L (ref 22–32)
CREATININE: 0.37 mg/dL — AB (ref 0.61–1.24)
Calcium: 8.4 mg/dL — ABNORMAL LOW (ref 8.9–10.3)
GFR calc non Af Amer: >60 mL/min (ref >60)
GLUCOSE: 95 mg/dL (ref 65–99)
Potassium: 2.5 mmol/L — CL (ref 3.5–5.1)
Sodium: 146 mmol/L — ABNORMAL HIGH (ref 135–145)

## 2016-11-06 LAB — POTASSIUM: POTASSIUM: 2.9 mmol/L — AB (ref 3.5–5.1)

## 2016-11-06 LAB — CBC
HEMATOCRIT: 36.7 % — AB (ref 40.0–52.0)
HEMOGLOBIN: 12.6 g/dL — AB (ref 13.0–18.0)
MCH: 34.4 pg — AB (ref 26.0–34.0)
MCHC: 34.3 g/dL (ref 32.0–36.0)
MCV: 100.2 fL — AB (ref 80.0–100.0)
Platelets: 124 10*3/uL — ABNORMAL LOW (ref 150–440)
RBC: 3.66 MIL/uL — ABNORMAL LOW (ref 4.40–5.90)
RDW: 16.7 % — AB (ref 11.5–14.5)
WBC: 4.8 10*3/uL (ref 3.8–10.6)

## 2016-11-06 LAB — GLUCOSE, CAPILLARY
GLUCOSE-CAPILLARY: 76 mg/dL (ref 65–99)
GLUCOSE-CAPILLARY: 80 mg/dL (ref 65–99)
GLUCOSE-CAPILLARY: 81 mg/dL (ref 65–99)
Glucose-Capillary: 72 mg/dL (ref 65–99)
Glucose-Capillary: 81 mg/dL (ref 65–99)
Glucose-Capillary: 91 mg/dL (ref 65–99)

## 2016-11-06 LAB — CULTURE, RESPIRATORY

## 2016-11-06 LAB — CULTURE, RESPIRATORY W GRAM STAIN: Culture: NORMAL

## 2016-11-06 LAB — TRIGLYCERIDES: TRIGLYCERIDES: 276 mg/dL — AB (ref <150)

## 2016-11-06 LAB — AMMONIA: Ammonia: 67 umol/L — ABNORMAL HIGH (ref 9–35)

## 2016-11-06 LAB — MAGNESIUM: Magnesium: 1.5 mg/dL — ABNORMAL LOW (ref 1.7–2.4)

## 2016-11-06 MED ORDER — GUAIFENESIN-DM 100-10 MG/5ML PO SYRP
5.0000 mL | ORAL_SOLUTION | ORAL | Status: DC | PRN
  Administered 2016-11-06 – 2016-11-09 (×6): 5 mL via ORAL
  Filled 2016-11-06 (×8): qty 5

## 2016-11-06 MED ORDER — MAGNESIUM SULFATE 2 GM/50ML IV SOLN
2.0000 g | Freq: Once | INTRAVENOUS | Status: AC
  Administered 2016-11-06: 2 g via INTRAVENOUS
  Filled 2016-11-06: qty 50

## 2016-11-06 MED ORDER — POTASSIUM CHLORIDE 20 MEQ PO PACK: 20.0000 meq | PACK | Freq: Once | ORAL | Status: DC

## 2016-11-06 MED ORDER — POTASSIUM CHLORIDE 20 MEQ PO PACK
20.0000 meq | PACK | Freq: Once | ORAL | Status: AC
  Administered 2016-11-06: 17:00:00 20 meq via ORAL
  Filled 2016-11-06: qty 1

## 2016-11-06 MED ORDER — PHENOL 1.4 % MT LIQD
1.0000 | OROMUCOSAL | Status: DC | PRN
Start: 2016-11-06 — End: 2016-11-06
  Filled 2016-11-06: qty 177

## 2016-11-06 MED ORDER — POTASSIUM CHLORIDE 20 MEQ PO PACK
40.0000 meq | PACK | Freq: Once | ORAL | Status: AC
  Administered 2016-11-06: 40 meq via ORAL
  Filled 2016-11-06: qty 2

## 2016-11-06 MED ORDER — FOLIC ACID 1 MG PO TABS
1.0000 mg | ORAL_TABLET | Freq: Every day | ORAL | Status: DC
  Administered 2016-11-07 – 2016-11-11 (×5): 1 mg via ORAL
  Filled 2016-11-06 (×5): qty 1

## 2016-11-06 MED ORDER — POTASSIUM CHLORIDE 10 MEQ/100ML IV SOLN
10.0000 meq | INTRAVENOUS | Status: AC
  Administered 2016-11-06 (×4): 10 meq via INTRAVENOUS
  Filled 2016-11-06 (×4): qty 100

## 2016-11-06 MED ORDER — LORAZEPAM 2 MG/ML IJ SOLN: 1.0000 mg | Freq: Four times a day (QID) | INTRAMUSCULAR | Status: DC | PRN

## 2016-11-06 MED ORDER — PHENOL 1.4 % MT LIQD
1.0000 | OROMUCOSAL | Status: DC | PRN
  Administered 2016-11-07 – 2016-11-08 (×2): 1 via OROMUCOSAL
  Filled 2016-11-06: qty 177

## 2016-11-06 MED ORDER — VITAMIN B-1 100 MG PO TABS
100.0000 mg | ORAL_TABLET | Freq: Every day | ORAL | Status: DC
  Administered 2016-11-07 – 2016-11-11 (×5): 100 mg via ORAL
  Filled 2016-11-06 (×5): qty 1

## 2016-11-06 MED ORDER — ADULT MULTIVITAMIN W/MINERALS CH
1.0000 | ORAL_TABLET | Freq: Every day | ORAL | Status: DC
  Administered 2016-11-07 – 2016-11-11 (×5): 1 via ORAL
  Filled 2016-11-06 (×5): qty 1

## 2016-11-06 MED ORDER — POTASSIUM CHLORIDE CRYS ER 20 MEQ PO TBCR
40.0000 meq | EXTENDED_RELEASE_TABLET | ORAL | Status: AC
  Administered 2016-11-06: 23:00:00 40 meq via ORAL
  Filled 2016-11-06: qty 2

## 2016-11-06 MED ORDER — POTASSIUM CHLORIDE 20 MEQ PO PACK: 40.0000 meq | PACK | Freq: Once | ORAL | Status: DC

## 2016-11-06 MED ORDER — FAMOTIDINE 20 MG PO TABS
20.0000 mg | ORAL_TABLET | Freq: Two times a day (BID) | ORAL | Status: DC
  Administered 2016-11-06 – 2016-11-11 (×10): 20 mg via ORAL
  Filled 2016-11-06 (×10): qty 1

## 2016-11-06 NOTE — Consult Note (Addendum)
   Name: Brett Washington MRN: [id number] DOB: 08/29/1980    ADMISSION DATE:  10/31/2016   BRIEF PATIENT DESCRIPTION:  36 y.o. Hispanic Male with generalized seizure at work on 7/19 likely secondary to ETOH withdrawal, now with severe DT's [facility]  SIGNIFICANT EVENTS  10/31/16>>Admission to [facility] Med/surg unit 11/01/16>> Transfer to Stepdown unit due to severe DT's  STUDIES:  10/31/16 CT Head>>Brain: No evidence of acute infarction, hemorrhage, hydrocephalus, extra-axial collection or mass lesion/mass effect. Vascular: No hyperdense vessel or unexpected calcification. Skull: Normal. Negative for fracture or focal lesion. Sinuses/Orbits: No acute finding. IMPRESSION: No acute intracranial abnormality noted.    Subjective Patient remains on the ventilator, is intubated, sedated, cannot provide history or review of systems. Some low BP yesterday.   REVIEW OF SYSTEMS:   Unable to assess due to pt confusion and agitation   Temp:  [98.2 F (36.8 C)-98.8 F (37.1 C)] 98.3 F (36.8 C) (07/25 0800) Pulse Rate:  [57-117] 66 (07/25 0700) Resp:  [13-28] 22 (07/25 0700) BP: (105-151)/(65-102) 150/98 (07/25 0700) SpO2:  [96 %-100 %] 100 % (07/25 0700) Weight:  [187 lb 13.3 oz (85.2 kg)] 187 lb 13.3 oz (85.2 kg) (07/25 0404)  PHYSICAL EXAMINATION: General:  Sedated. Neuro: remains Sedated. HEENT:  Still Atraumatic, Normocephalic, No JVD, Neck supple, Scleral icterus present bilateraly Cardiovascular:  Still RRR, 3/6 Diastolic Murmur, No rubs or gallops, No peripheral edema Lungs:  Still  Regular, unlabored, Symmetrical expansion, Clear throughout Abdomen:  Soft, nontender, nondistended, Bowel sounds present Musculoskeletal:  Active ROM all extremities, limited range of motion of left upper extremity due to previous work injury, strength 5/5 except in left upper extremity due to injury Skin:  Bruise to right cheek, No lesions, rashes, or  ulcerations   Recent Labs Lab 11/04/16 0509 11/05/16 0444 11/06/16 0516  NA 150* 153* 146*  K 3.2* 3.4* 2.5*  CL 114* 127* 112*  CO2 26 20* 28  BUN 17 21* 7  CREATININE 1.49* 1.00 0.37*  GLUCOSE 98 125* 95    Recent Labs Lab 11/04/16 0509 11/05/16 0444 11/06/16 0516  HGB 13.8 13.0 12.6*  HCT 40.6 39.2* 36.7*  WBC 5.0 5.0 4.8  PLT 127* 121* 124*    ASSESSMENT / PLAN: 36 year old Hispanic male admitted to the intensive care unit for severe decompensation and severe delirium from alcohol Withdrawal with acute metabolic encephalopathy, now extubated 7/24, tolerating well.  Hypokalemia, will replace.   minimize sedation. CIWA protocol  Aspiration pneumonia, continue abx.   Wells Guileseep Willette Mudry, MD.   Board Certified in Internal Medicine, Pulmonary Medicine, Critical Care Medicine, and Sleep Medicine.  [facility] Office Number: [phone] Pager: [phone]  Santiago Gladavid Kasa, M.D.  Billy Fischeravid Simonds, M.D

## 2016-11-06 NOTE — Consult Note (Signed)
[facility] Face-to-Face Psychiatry Consult   Reason for Consult:  Alcoholism. Referring Physician:  Dr. Posey Pronto Patient Identification: Brett Washington MRN:  [id number] Principal Diagnosis: Alcohol withdrawal seizure [facility]) Diagnosis:   Patient Active Problem List   Diagnosis Date Noted  . Alcohol use disorder, severe, dependence ([place]) [F10.20] 11/06/2016  . Aspiration pneumonia due to vomit ([place]) [J69.0]   . Alcohol withdrawal seizure ([place]) [O97.353, R56.9] 10/31/2016  . Thrombocytopenia ([place]) [D69.6] 10/31/2016  . Transaminitis [R74.0] 10/31/2016  . Hypokalemia [E87.6] 10/31/2016    Total Time spent with patient: 1 hour  Subjective:    Identifying data. Brett Washington is a 36 year old male with a history of alcoholism.  Chief complaint. "I want help."  History of present illness. Information was obtained from the patient, the chart and his wife. The patient was brought to the emergency room after witnessed episode of seizures at work this was related to alcohol withdrawal. He had delirium tremens and was transferred to ICU for treatment. Psychiatry was asked to evaluate him for substance abuse. The patient has difficulty speaking due to sore throat from intubation. His voice is low and raspy difficult to understand. He now tells me that he wants help for alcoholism but is uncertain how to go about it. Unfortunately he already indicates that he wants to return to work as quickly as possible and is asking for a note. The patient denies any symptoms of depression, anxiety, or psychosis. He is not suicidal or homicidal. He does not use drugs other than alcohol. For the past 2 weeks he has been drinking very heavily and did not go to work.  Past psychiatric history. I spoke with his wife extensively. She reports that he has been drinking since the age of 54 socially. 4 years ago his drinking escalated resulting in him losing that job. He then started drinking even more and for the past 2 years has been  drinking daily beer and cheap liquor. 2 years ago he went for alcohol treatment to someplace in [place] for a week but relapsed on alcohol almost immediately. He has never suffered mental illness, attempted suicide, was hospitalized.  Family psychiatric history. His great grandfather on paternal side died of alcoholism. He has a maternal uncle who is an alcoholic.  Social history. He lives with his wife and children. 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  Risk to Self: Is patient at risk for suicide?: No Risk to Others:   Prior Inpatient Therapy:   Prior Outpatient Therapy:    Past Medical History:  Past Medical History:  Diagnosis Date  . Alcohol abuse   . Heart murmur   . Seizures ([place])     Past Surgical History:  Procedure Laterality Date  . HAND SURGERY     Family History:  Family History  Problem Relation Age of Onset  . Family history unknown: Yes   Social History:  History  Alcohol Use  . 33.6 oz/week  . 96 Cans of beer per week     History  Drug Use No    Social History   Social History  . Marital status: Married    Spouse name: N/A  . Number of children: N/A  . Years of education: N/A   Social History Main Topics  . Smoking status: Never Smoker  . Smokeless tobacco: Never Used  . Alcohol  use 33.6 oz/week    56 Cans of beer per week  . Drug use: No  . Sexual activity: Not Asked   Other Topics Concern  . None   Social History Narrative  . None   Additional Social History:    Allergies:  No Known Allergies  Labs:  Results for orders placed or performed during the hospital encounter of 10/31/16 (from the past 48 hour(s))  Glucose, capillary     Status: None   Collection Time: 11/04/16  7:38 PM  Result Value Ref Range   Glucose-Capillary 92 65 - 99 mg/dL  Glucose, capillary     Status: Abnormal   Collection Time:  11/04/16 11:50 PM  Result Value Ref Range   Glucose-Capillary 112 (H) 65 - 99 mg/dL  Blood gas, arterial     Status: Abnormal   Collection Time: 11/05/16  3:52 AM  Result Value Ref Range   FIO2 0.40    Delivery systems VENTILATOR    Mode PRESSURE REGULATED VOLUME CONTROL    VT 500 mL   Peep/cpap 5.0 cm H20   pH, Arterial 7.26 (L) 7.350 - 7.450   pCO2 arterial 45 32.0 - 48.0 mmHg   pO2, Arterial 112 (H) 83.0 - 108.0 mmHg   Bicarbonate 20.2 20.0 - 28.0 mmol/L   Acid-base deficit 6.8 (H) 0.0 - 2.0 mmol/L   O2 Saturation 97.6 %   Patient temperature 37.0    Collection site RIGHT RADIAL    Sample type ARTERIAL DRAW    Allens test (pass/fail) PASS PASS   Mechanical Rate 16   CBC     Status: Abnormal   Collection Time: 11/05/16  4:44 AM  Result Value Ref Range   WBC 5.0 3.8 - 10.6 K/uL   RBC 3.77 (L) 4.40 - 5.90 MIL/uL   Hemoglobin 13.0 13.0 - 18.0 g/dL   HCT 39.2 (L) 40.0 - 52.0 %   MCV 103.7 (H) 80.0 - 100.0 fL   MCH 34.5 (H) 26.0 - 34.0 pg   MCHC 33.3 32.0 - 36.0 g/dL   RDW 18.4 (H) 11.5 - 14.5 %   Platelets 121 (L) 150 - 440 K/uL  Comprehensive metabolic panel     Status: Abnormal   Collection Time: 11/05/16  4:44 AM  Result Value Ref Range   Sodium 153 (H) 135 - 145 mmol/L   Potassium 3.4 (L) 3.5 - 5.1 mmol/L   Chloride 127 (H) 101 - 111 mmol/L   CO2 20 (L) 22 - 32 mmol/L   Glucose, Bld 125 (H) 65 - 99 mg/dL   BUN 21 (H) 6 - 20 mg/dL   Creatinine, Ser 1.00 0.61 - 1.24 mg/dL   Calcium 8.6 (L) 8.9 - 10.3 mg/dL   Total Protein 5.8 (L) 6.5 - 8.1 g/dL   Albumin 2.2 (L) 3.5 - 5.0 g/dL   AST 208 (H) 15 - 41 U/L   ALT 103 (H) 17 - 63 U/L   Alkaline Phosphatase 127 (H) 38 - 126 U/L   Total Bilirubin 8.3 (H) 0.3 - 1.2 mg/dL   GFR calc non Af Amer >60 >60 mL/min   GFR calc Af Amer >60 >60 mL/min    Comment: (NOTE) The eGFR has been calculated using the CKD EPI equation. This calculation has not been validated in all clinical situations. eGFR's persistently <60 mL/min  signify possible Chronic Kidney Disease.    Anion gap 6 5 - 15  Magnesium     Status: None   Collection Time: 11/05/16  4:44 AM  Result Value Ref Range   Magnesium 2.2 1.7 - 2.4 mg/dL  Glucose, capillary     Status: None   Collection Time: 11/05/16  5:11 AM  Result Value Ref Range   Glucose-Capillary 90 65 - 99 mg/dL  Glucose, capillary     Status: Abnormal   Collection Time: 11/05/16  7:26 AM  Result Value Ref Range   Glucose-Capillary 109 (H) 65 - 99 mg/dL  Blood gas, arterial     Status: Abnormal (Preliminary result)   Collection Time: 11/05/16  9:18 AM  Result Value Ref Range   FIO2 0.35    Delivery systems VENTILATOR    Mode SPONTANEOUS BREATHING TRIAL    Peep/cpap 5.0 cm H20   Pressure support 5.0 cm H20   pH, Arterial 7.28 (L) 7.350 - 7.450   pCO2 arterial 45 32.0 - 48.0 mmHg   pO2, Arterial 68 (L) 83.0 - 108.0 mmHg   Bicarbonate 21.1 20.0 - 28.0 mmol/L   Acid-base deficit 5.7 (H) 0.0 - 2.0 mmol/L   O2 Saturation 90.6 %   Patient temperature 37.0    Collection site RIGHT RADIAL    Sample type ARTERIAL DRAW    Allens test (pass/fail) PASS PASS   Mechanical Rate PENDING   Glucose, capillary     Status: Abnormal   Collection Time: 11/05/16 11:45 AM  Result Value Ref Range   Glucose-Capillary 109 (H) 65 - 99 mg/dL  Ammonia     Status: Abnormal   Collection Time: 11/05/16 12:21 PM  Result Value Ref Range   Ammonia 69 (H) 9 - 35 umol/L  Glucose, capillary     Status: Abnormal   Collection Time: 11/05/16  4:24 PM  Result Value Ref Range   Glucose-Capillary 110 (H) 65 - 99 mg/dL  Glucose, capillary     Status: None   Collection Time: 11/05/16  7:31 PM  Result Value Ref Range   Glucose-Capillary 82 65 - 99 mg/dL   Comment 1 Notify RN    Comment 2 Document in Chart   Glucose, capillary     Status: None   Collection Time: 11/05/16 11:52 PM  Result Value Ref Range   Glucose-Capillary 91 65 - 99 mg/dL   Comment 1 Notify RN    Comment 2 Document in Chart   Glucose,  capillary     Status: None   Collection Time: 11/06/16  3:45 AM  Result Value Ref Range   Glucose-Capillary 72 65 - 99 mg/dL   Comment 1 Notify RN    Comment 2 Document in Chart   CBC     Status: Abnormal   Collection Time: 11/06/16  5:16 AM  Result Value Ref Range   WBC 4.8 3.8 - 10.6 K/uL   RBC 3.66 (L) 4.40 - 5.90 MIL/uL   Hemoglobin 12.6 (L) 13.0 - 18.0 g/dL   HCT 36.7 (L) 40.0 - 52.0 %   MCV 100.2 (H) 80.0 - 100.0 fL   MCH 34.4 (H) 26.0 - 34.0 pg   MCHC 34.3 32.0 - 36.0 g/dL   RDW 16.7 (H) 11.5 - 14.5 %   Platelets 124 (L) 150 - 440 K/uL  Basic metabolic panel     Status: Abnormal   Collection Time: 11/06/16  5:16 AM  Result Value Ref Range   Sodium 146 (H) 135 - 145 mmol/L   Potassium 2.5 (LL) 3.5 - 5.1 mmol/L    Comment: CRITICAL RESULT CALLED TO, READ BACK BY AND VERIFIED WITH  TESSTESS [place]  AT 0554 11/06/16 SDR    Chloride 112 (H) 101 - 111 mmol/L   CO2 28 22 - 32 mmol/L   Glucose, Bld 95 65 - 99 mg/dL   BUN 7 6 - 20 mg/dL   Creatinine, Ser 0.37 (L) 0.61 - 1.24 mg/dL   Calcium 8.4 (L) 8.9 - 10.3 mg/dL   GFR calc non Af Amer >60 >60 mL/min   GFR calc Af Amer >60 >60 mL/min    Comment: (NOTE) The eGFR has been calculated using the CKD EPI equation. This calculation has not been validated in all clinical situations. eGFR's persistently <60 mL/min signify possible Chronic Kidney Disease.    Anion gap 6 5 - 15  Magnesium     Status: Abnormal   Collection Time: 11/06/16  5:16 AM  Result Value Ref Range   Magnesium 1.5 (L) 1.7 - 2.4 mg/dL  Glucose, capillary     Status: None   Collection Time: 11/06/16  7:32 AM  Result Value Ref Range   Glucose-Capillary 76 65 - 99 mg/dL   Comment 1 Notify RN   Glucose, capillary     Status: None   Collection Time: 11/06/16 11:24 AM  Result Value Ref Range   Glucose-Capillary 81 65 - 99 mg/dL   Comment 1 Notify RN   Ammonia     Status: Abnormal   Collection Time: 11/06/16 11:49 AM  Result Value Ref Range   Ammonia 67 (H) 9  - 35 umol/L  Triglycerides     Status: Abnormal   Collection Time: 11/06/16 11:55 AM  Result Value Ref Range   Triglycerides 276 (H) <150 mg/dL    Current Facility-Administered Medications  Medication Dose Route Frequency Provider Last Rate Last Dose  . Ampicillin-Sulbactam (UNASYN) 3 g in sodium chloride 0.9 % 100 mL IVPB  3 g Intravenous Q6H Fritzi Mandes, MD   Stopped at 11/06/16 1655  . chlorhexidine gluconate (MEDLINE KIT) (PERIDEX) 0.12 % solution 15 mL  15 mL Mouth Rinse BID Laverle Hobby, MD   15 mL at 11/06/16 0847  . dextrose 5 % solution   Intravenous Continuous Fritzi Mandes, MD 125 mL/hr at 11/06/16 1527    . enoxaparin (LOVENOX) injection 40 mg  40 mg Subcutaneous Q24H Flora Lipps, MD   40 mg at 11/05/16 1654  . famotidine (PEPCID) tablet 20 mg  20 mg Oral BID Hallaji, Dani Gobble, RPH      . [START ON 4/43/1540] folic acid (FOLVITE) tablet 1 mg  1 mg Oral Daily Hallaji, Sheema M, RPH      . hydrALAZINE (APRESOLINE) injection 10 mg  10 mg Intravenous Q4H PRN Awilda Bill, NP      . ibuprofen (ADVIL,MOTRIN) tablet 400 mg  400 mg Oral Q6H PRN Lance Coon, MD   400 mg at 11/04/16 0867  . insulin aspart (novoLOG) injection 0-15 Units  0-15 Units Subcutaneous Q4H Laverle Hobby, MD      . LORazepam (ATIVAN) injection 1 mg  1 mg Intravenous Q6H PRN Fritzi Mandes, MD      . MEDLINE mouth rinse  15 mL Mouth Rinse 10 times per day Laverle Hobby, MD   15 mL at 11/06/16 1105  . [START ON 11/07/2016] multivitamin with minerals tablet 1 tablet  1 tablet Oral Daily Hallaji, Sheema M, RPH      . ondansetron (ZOFRAN) tablet 4 mg  4 mg Oral Q6H PRN Lance Coon, MD       Or  . ondansetron [facility]) injection 4 mg  4 mg Intravenous Q6H PRN Lance Coon, MD   4 mg at 11/06/16 1151  . potassium chloride (KLOR-CON) packet 20 mEq  20 mEq Oral Once Hallaji, Sheema M, RPH      . sodium chloride flush (NS) 0.9 % injection 10-40 mL  10-40 mL Intracatheter PRN Fritzi Mandes, MD   10 mL  at 11/06/16 0846  . [START ON 11/07/2016] thiamine (VITAMIN B-1) tablet 100 mg  100 mg Oral Daily Hallaji, Dani Gobble, RPH        Musculoskeletal: Strength & Muscle Tone: within normal limits Gait & Station: normal Patient leans: N/A  Psychiatric Specialty Exam: I reviewed physical examination performed by medical provider and agree with the findings. Physical Exam  Nursing note and vitals reviewed. Psychiatric: Thought content normal. His affect is blunt. His speech is slurred. He is slowed and withdrawn. Cognition and memory are normal. He expresses impulsivity.    Review of Systems  HENT: Positive for sore throat.   Neurological: Positive for seizures.  Psychiatric/Behavioral: Positive for substance abuse.  All other systems reviewed and are negative.   Blood pressure 121/78, pulse 87, temperature 98.8 F (37.1 C), temperature source Oral, resp. rate 18, height _0  (1.727 m), weight 65.1 kg (143 lb 8.3 oz), SpO2 98 %.Body mass index is 21.82 kg/m.  General Appearance: Fairly Groomed  Eye Contact:  Good  Speech:  Garbled  Volume:  Decreased  Mood:  Depressed  Affect:  Blunt and Congruent  Thought Process:  Goal Directed and Descriptions of Associations: Intact  Orientation:  Full (Time, Place, and Person)  Thought Content:  WDL  Suicidal Thoughts:  No  Homicidal Thoughts:  No  Memory:  Immediate;   Fair Recent;   Fair Remote;   Fair  Judgement:  Poor  Insight:  Lacking  Psychomotor Activity:  Psychomotor Retardation  Concentration:  Concentration: Fair and Attention Span: Fair  Recall:  [employer] of Knowledge:  Fair  Language:  Fair  Akathisia:  No  Handed:  Right  AIMS (if indicated):     Assets:  Communication Skills Desire for Improvement Housing Intimacy Physical Health Resilience Social Support Transportation  ADL's:  Intact  Cognition:  WNL  Sleep:        Treatment Plan Summary: Daily contact with patient to assess and evaluate symptoms and  progress in treatment and Medication management   PLAN:  1. I spoke with the wife who strongly supports residential substance abuse treatment. The patient however could not commit. I will talk to him more tomorrow.    Disposition: No evidence of imminent risk to self or others at present.   Patient does not meet criteria for psychiatric inpatient admission. Supportive therapy provided about ongoing stressors. Refer to IOP. Discussed crisis plan, support from social network, calling 911, coming to the Emergency Department, and calling Suicide Hotline.  Orson Slick, MD 11/06/2016 4:59 PM

## 2016-11-06 NOTE — Progress Notes (Signed)
Pharmacy Consult for Electrolyte Monitoring  Pharmacy consulted for electrolyte management for 36 yo male requiring mechanical intubation secondary to alcohol withdrawal and seizures.    Plan:  7/25 AM: K+ 2.5, Mg: 1.5  KCL 10mEq IV x 4 ordered. Will also order KCL 60mEq PO ( 40mEq x 1 dose and 20mEq x 1 dose) and Magnesium 2g IV x 1 dose.  No additional supplementation at this time. Will recheck K+ this evening, and all electrolytes with am labs.   In setting of seizures goal magnesium > 2.   7/25 K+@ 1829= 2.9.  Will order KCL 40meq PO q2h x 2 doses. F/u labs in am   No Known Allergies  Patient Measurements: Height: 5\' 8"  (172.7 cm) Weight: 143 lb 8.3 oz (65.1 kg) IBW/kg (Calculated) : 68.4  Vital Signs: Temp: 98.8 F (37.1 C) (07/25 1443) Temp Source: Oral (07/25 1443) BP: 121/78 (07/25 1443) Pulse Rate: 87 (07/25 1443) Intake/Output from previous day: 07/24 0701 - 07/25 0700 In: 2952.5 [I.V.:2401.2; NG/GT:151.3; IV Piggyback:400] Out: 3570 [Urine:2970; Stool:600] Intake/Output from this shift: No intake/output data recorded.  Labs:  Recent Labs  11/04/16 0509 11/05/16 0444 11/06/16 0516  WBC 5.0 5.0 4.8  HGB 13.8 13.0 12.6*  HCT 40.6 39.2* 36.7*  PLT 127* 121* 124*  CREATININE 1.49* 1.00 0.37*  MG 2.7* 2.2 1.5*  PHOS 3.7  --   --   ALBUMIN 2.6* 2.2*  --   PROT 6.2* 5.8*  --   AST 286* 208*  --   ALT 131* 103*  --   ALKPHOS 127* 127*  --   BILITOT 10.9* 8.3*  --    Estimated Creatinine Clearance: 119.8 mL/min (A) (by C-G formula based on SCr of 0.37 mg/dL (L)).  Potassium (mmol/L)  Date Value  11/06/2016 2.9 (L)  11/11/2013 3.5   Sodium (mmol/L)  Date Value  11/06/2016 146 (H)  11/11/2013 145   Calcium (mg/dL)  Date Value  95/28/413207/25/2018 8.4 (L)   Calcium, Total (mg/dL)  Date Value  44/01/027207/30/2015 8.2 (L)    Pharmacy will continue to monitor and adjust per consult.   Angelique BlonderMerrill,Geni Skorupski A, PharmD, BCPS Clinical Pharmacist 11/06/2016 7:14  PM

## 2016-11-06 NOTE — Progress Notes (Signed)
[facility] [place] [facility]   PATIENT NAME: Brett Washington    MR#:  [id number]  DATE OF BIRTH:  04/30/1980  SUBJECTIVE:  Pt transferred to floor Rectal tube + Wife in the room Pt much better---trying to pick things in the air. Wants to eat  REVIEW OF SYSTEMS:   Review of Systems  Unable to perform ROS: Mental acuity    DRUG ALLERGIES:  No Known Allergies  VITALS:  Blood pressure 121/78, pulse 87, temperature 98.8 F (37.1 C), temperature source Oral, resp. rate 18, height 5\' 8"  (1.727 m), weight 65.1 kg (143 lb 8.3 oz), SpO2 98 %.  PHYSICAL EXAMINATION:   Physical Exam  GENERAL:  36 y.o.-year-old patient lying in the bed with no acute distress. Obese, critically ill EYES: Pupils equal, round, reactive to light and accommodation. No scleral icterus. Extraocular muscles intact.  HEENT: Head atraumatic, normocephalic. Oropharynx and nasopharynx clear.  NECK:  Supple, no jugular venous distention. No thyroid enlargement, no tenderness.  LUNGS: Normal breath sounds bilaterally, no wheezing, rales, rhonchi. No use of accessory muscles of respiration. tachycardia CARDIOVASCULAR: S1, S2 normal. No murmurs, rubs, or gallops.  ABDOMEN: Soft, nontender, nondistended. Bowel sounds present. No organomegaly or mass. Rectal tube EXTREMITIES: No cyanosis, clubbing or edema b/l.    NEUROLOGIC: non focal PSYCHIATRIC:awake and alert SKIN: No obvious rash, lesion, or ulcer.   LABORATORY PANEL:  CBC  Recent Labs Lab 11/06/16 0516  WBC 4.8  HGB 12.6*  HCT 36.7*  PLT 124*    Chemistries   Recent Labs Lab 11/05/16 0444 11/06/16 0516  NA 153* 146*  K 3.4* 2.5*  CL 127* 112*  CO2 20* 28  GLUCOSE 125* 95  BUN 21* 7  CREATININE 1.00 0.37*  CALCIUM 8.6* 8.4*  MG 2.2 1.5*  AST 208*  --   ALT 103*  --   ALKPHOS 127*  --   BILITOT 8.3*  --    Cardiac Enzymes  Recent Labs Lab 10/31/16 1234  TROPONINI <0.03   RADIOLOGY:  Dg Chest 1  View  Result Date: 11/05/2016 CLINICAL DATA:  Shortness of breath. EXAM: CHEST 1 VIEW COMPARISON:  11/03/2016. FINDINGS: Endotracheal tube, NG tube, right IJ line stable position. Heart size stable. Mild bibasilar atelectasis and infiltrates again noted, slight interim improvement from prior exam. No prominent pleural effusion. No pneumothorax. IMPRESSION: 1. Lines and tubes in stable position. 2. Mild bibasilar atelectasis and infiltrates again noted with slight interim improvement from prior exam. Electronically Signed   By: Maisie Fushomas  Register   On: 11/05/2016 06:32   ASSESSMENT AND PLAN:  Brett Washington  is a 36 y.o. male who presents with Seizure episode. Patient was working when he had his seizure. He states that he has had a seizure like this before due to alcohol withdrawal. He states that he drinks about 8 years daily. His alcohol level in the ED was only 6.   * Acute DT's due to severe ETOH withdrawal -pt on CIWA protocol -still tachy and quiet restless despite being on IV Precedex drip. Patient wasn't able to maintain his oral secretions and hence patient is now intubated on the ventilator----now extubated and off Precedex drip -Per wife patient has been drinking very heavily last 2 years - per wife  he has been an alcoholic since age 36 -Psych consult appreciated  * Sepsis  Tachycardia,fever and CXR right LL pneumonia -likley aspiration pneumonia -IV Unasyn---change to oral abxs tomorrow  *Thrombocytopenia (HCC) - potentially related to  alcohol related liver dysfunction  *Acute hypernatremia, hyperchloremia suspected due to GI losses -Patient getting lactulose for hyperammonemia--- hold lactulose -IV dextrose with water -Monitor I's and O's and metabolic panel.  * acute Hepatic Transaminitis -due to alcohol hepatitis, although with thrombocytopenia as above this could be early cirrhosis  -Bilirubin 10.6!! 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 CSW for d/c planning  Case discussed with Care Management/Social Worker. Management plans discussed with the patient, family and they are in agreement.  CODE STATUS: FULL  DVT Prophylaxis: lovenox  TOTAL TIME TAKING CARE OF THIS PATIENT: 30 minutes.  >50% time spent on counselling and coordination of care  POSSIBLE D/C IN 1-2 DAYS, DEPENDING ON CLINICAL CONDITION.  Note: This dictation was prepared with Dragon dictation along with smaller phrase technology. Any transcriptional errors that result from this process are unintentional.  Kingstyn Deruiter M.D on 11/06/2016 at 5:42 PM  Between 7am to 6pm - Pager - [phone]  After 6pm go to [email or web address] - Social research officer, governmentpassword EPAS [facility]  [facility]  Office  [phone]  CC: Primary care physician; [facility]

## 2016-11-06 NOTE — Progress Notes (Signed)
Pharmacy Consult for Electrolyte Monitoring  Pharmacy consulted for electrolyte management for 36 yo male requiring mechanical intubation secondary to alcohol withdrawal and seizures.    Plan:  7/25 AM: K+ 2.5, Mg: 1.5  KCL 10mEq IV x 4 ordered. Will also order KCL 60mEq PO ( 40mEq x 1 dose and 20mEq x 1 dose) and Magnesium 2g IV x 1 dose.  No additional supplementation at this time. Will recheck K+ this evening, and all electrolytes with am labs.   In setting of seizures goal magnesium > 2.   No Known Allergies  Patient Measurements: Height: 5\' 6"  (167.6 cm) Weight: 187 lb 13.3 oz (85.2 kg) IBW/kg (Calculated) : 63.8  Vital Signs: Temp: 98.3 F (36.8 C) (07/25 0800) Temp Source: Oral (07/25 0800) BP: 150/98 (07/25 0700) Pulse Rate: 66 (07/25 0700) Intake/Output from previous day: 07/24 0701 - 07/25 0700 In: 2952.5 [I.V.:2401.2; NG/GT:151.3; IV Piggyback:400] Out: 3570 [Urine:2970; Stool:600] Intake/Output from this shift: Total I/O In: 0  Out: 1835 [Urine:1635; Stool:200]  Labs:  Recent Labs  11/04/16 0509 11/05/16 0444 11/06/16 0516  WBC 5.0 5.0 4.8  HGB 13.8 13.0 12.6*  HCT 40.6 39.2* 36.7*  PLT 127* 121* 124*  CREATININE 1.49* 1.00 0.37*  MG 2.7* 2.2 1.5*  PHOS 3.7  --   --   ALBUMIN 2.6* 2.2*  --   PROT 6.2* 5.8*  --   AST 286* 208*  --   ALT 131* 103*  --   ALKPHOS 127* 127*  --   BILITOT 10.9* 8.3*  --    Estimated Creatinine Clearance: 133.2 mL/min (A) (by C-G formula based on SCr of 0.37 mg/dL (L)).  Potassium (mmol/L)  Date Value  11/06/2016 2.5 (LL)  11/11/2013 3.5   Sodium (mmol/L)  Date Value  11/06/2016 146 (H)  11/11/2013 145   Calcium (mg/dL)  Date Value  16/10/960407/25/2018 8.4 (L)   Calcium, Total (mg/dL)  Date Value  54/09/811907/30/2015 8.2 (L)    Pharmacy will continue to monitor and adjust per consult.   Gardner CandleSheema M Towana Stenglein, PharmD, BCPS Clinical Pharmacist 11/06/2016 12:16 PM

## 2016-11-07 LAB — MAGNESIUM
MAGNESIUM: 2 mg/dL (ref 1.7–2.4)
Magnesium: 1.5 mg/dL — ABNORMAL LOW (ref 1.7–2.4)

## 2016-11-07 LAB — BLOOD GAS, ARTERIAL
Acid-base deficit: 5.7 mmol/L — ABNORMAL HIGH (ref 0.0–2.0)
Bicarbonate: 21.1 mmol/L (ref 20.0–28.0)
FIO2: 0.35
O2 SAT: 90.6 %
PATIENT TEMPERATURE: 37
PCO2 ART: 45 mmHg (ref 32.0–48.0)
PEEP/CPAP: 5 cmH2O
PO2 ART: 68 mmHg — AB (ref 83.0–108.0)
PRESSURE SUPPORT: 5 cmH2O
pH, Arterial: 7.28 — ABNORMAL LOW (ref 7.350–7.450)

## 2016-11-07 LAB — BASIC METABOLIC PANEL
ANION GAP: 6 (ref 5–15)
CHLORIDE: 106 mmol/L (ref 101–111)
CO2: 29 mmol/L (ref 22–32)
Calcium: 8.7 mg/dL — ABNORMAL LOW (ref 8.9–10.3)
Creatinine, Ser: 0.42 mg/dL — ABNORMAL LOW (ref 0.61–1.24)
GFR calc non Af Amer: >60 mL/min (ref >60)
Glucose, Bld: 89 mg/dL (ref 65–99)
POTASSIUM: 2.9 mmol/L — AB (ref 3.5–5.1)
SODIUM: 141 mmol/L (ref 135–145)

## 2016-11-07 LAB — GLUCOSE, CAPILLARY
GLUCOSE-CAPILLARY: 80 mg/dL (ref 65–99)
GLUCOSE-CAPILLARY: 83 mg/dL (ref 65–99)
GLUCOSE-CAPILLARY: 85 mg/dL (ref 65–99)
GLUCOSE-CAPILLARY: 98 mg/dL (ref 65–99)
Glucose-Capillary: 94 mg/dL (ref 65–99)
Glucose-Capillary: 90 mg/dL (ref 65–99)

## 2016-11-07 LAB — POTASSIUM: POTASSIUM: 3.1 mmol/L — AB (ref 3.5–5.1)

## 2016-11-07 LAB — PHOSPHORUS: PHOSPHORUS: 3.7 mg/dL (ref 2.5–4.6)

## 2016-11-07 MED ORDER — AMOXICILLIN-POT CLAVULANATE 875-125 MG PO TABS
1.0000 | ORAL_TABLET | Freq: Two times a day (BID) | ORAL | Status: DC
  Administered 2016-11-07 – 2016-11-11 (×9): 1 via ORAL
  Filled 2016-11-07 (×9): qty 1

## 2016-11-07 MED ORDER — MAGNESIUM SULFATE 4 GM/100ML IV SOLN
4.0000 g | Freq: Once | INTRAVENOUS | Status: AC
  Administered 2016-11-07: 11:00:00 4 g via INTRAVENOUS
  Filled 2016-11-07: qty 100

## 2016-11-07 MED ORDER — POTASSIUM CHLORIDE 2 MEQ/ML IV SOLN
INTRAVENOUS | Status: DC
  Filled 2016-11-07 (×3): qty 1000

## 2016-11-07 MED ORDER — POTASSIUM CHLORIDE CRYS ER 20 MEQ PO TBCR
40.0000 meq | EXTENDED_RELEASE_TABLET | Freq: Two times a day (BID) | ORAL | Status: DC
  Administered 2016-11-07 – 2016-11-11 (×8): 40 meq via ORAL
  Filled 2016-11-07 (×8): qty 2

## 2016-11-07 MED ORDER — POTASSIUM CHLORIDE CRYS ER 20 MEQ PO TBCR
40.0000 meq | EXTENDED_RELEASE_TABLET | Freq: Two times a day (BID) | ORAL | Status: DC
  Administered 2016-11-07: 40 meq via ORAL
  Filled 2016-11-07: qty 2

## 2016-11-07 MED ORDER — POTASSIUM CL IN DEXTROSE 5% 20 MEQ/L IV SOLN
20.0000 meq | INTRAVENOUS | Status: DC
  Administered 2016-11-07: 11:00:00 20 meq via INTRAVENOUS
  Filled 2016-11-07 (×3): qty 1000

## 2016-11-07 MED ORDER — POTASSIUM CHLORIDE 10 MEQ/100ML IV SOLN
10.0000 meq | INTRAVENOUS | Status: DC
  Administered 2016-11-07: 10 meq via INTRAVENOUS
  Filled 2016-11-07 (×2): qty 100

## 2016-11-07 MED ORDER — CHLORHEXIDINE GLUCONATE 0.12 % MT SOLN
15.0000 mL | Freq: Two times a day (BID) | OROMUCOSAL | Status: DC
  Administered 2016-11-08 – 2016-11-10 (×4): 15 mL via OROMUCOSAL
  Filled 2016-11-07 (×4): qty 15

## 2016-11-07 NOTE — Progress Notes (Signed)
Nutrition Follow-up  DOCUMENTATION CODES:   Not applicable  INTERVENTION:  Provide Ensure Enlive po BID, each supplement provides 350 kcal and 20 grams of protein.  Encouraged adequate intake of calories and protein at meals.  Continue daily MVI, folate 1 mg, thiamine 100 mg.  NUTRITION DIAGNOSIS:   Inadequate oral intake related to poor appetite as evidenced by per patient/family report.  Ongoing.  GOAL:   Patient will meet greater than or equal to 90% of their needs  Progressing.  MONITOR:   PO intake, Supplement acceptance, Labs, Weight trends, I & O's  REASON FOR ASSESSMENT:   Consult, Ventilator Enteral/tube feeding initiation and management (and Assessment of nutrition requirements/status)  ASSESSMENT:   36 year old male with PMHx of heart murmur, seizures, EtOH abuse (drinks 8 beers/day) who presented after a seizure found to have acute encephalopathy secondary to DTs, elevated liver enzymes. On 7/20 pt was transferred to stepdown unit due to severe DT's requiring Precedex gtt. Pt s/p emergent intubation on 7/22 to protect airways in setting of increasing oral secretions.   -Pt was extubated on 7/24.  -Rectal tube was removed 7/26. Pt was having diarrhea from lactulose.  Spoke with patient at bedside. He reports his appetite is slowly improving but not back to usual yet. He ate most of his breakfast this morning. He reports he had pizza for lunch. Denies N/V or abdominal pain. Patient reports he was eating well PTA.  Meal Completion: 50-75%  Medications reviewed and include: Augmentin, famotidine, folic acid 1 mg daily, Novolog 0-15 units Q4hrs, MVI daily, potassium chloride 40 mEq BID, thiamine 100 mg daily.  Labs reviewed: CBG 80-98, Potassium 2.9, BUN <5, Creatinine 0.42.  Diet Order:  Diet regular Room service appropriate? Yes; Fluid consistency: Thin  Skin:  Reviewed, no issues  Last BM:  11/06/2016 - type 7  Height:   Ht Readings from Last 1  Encounters:  11/06/16 5\' 8"  (1.727 m)    Weight:   Wt Readings from Last 1 Encounters:  11/07/16 182 lb 12.8 oz (82.9 kg)    Ideal Body Weight:  64.5 kg  BMI:  Body mass index is 27.79 kg/m.  Estimated Nutritional Needs:   Kcal:  2015 (PSE w/ MSJ 1722, Ve 7.9, Tmax 37.9)  Protein:  100-117 grams (1.2-1.4 grams/kg)  Fluid:  2-2.2 L/day (30-35 ml/kg IBW)  EDUCATION NEEDS:   No education needs identified at this time  Helane RimaLeanne Maxwel Meadowcroft, MS, RD, LDN Pager: [phone] After Hours Pager: [phone]

## 2016-11-07 NOTE — Progress Notes (Signed)
Inpatient Rehabilitation  Per PT request, patient was screened by Fae PippinMelissa Monserath Neff for appropriateness for an Inpatient Acute Rehab consult.  At this time I await OT evaluation and recommendations.  Plan to follow along.  Please call with questions.   Charlane FerrettiMelissa Johnathon Mittal, M.A., CCC/SLP Admission Coordinator  [facility]  Cell [phone]

## 2016-11-07 NOTE — NC FL2 (Signed)
[place] LEVEL OF CARE SCREENING TOOL     IDENTIFICATION  Patient Name: Brett Washington Birthdate: 08/16/1980 Sex: male Admission Date (Current Location): 10/31/2016  [facility] [place] Number:  Engineering geologist and Address:  [facility], [address] [place], [place] [address]      Provider Number: [id number]  Attending Physician Name and Address:  Dustin Flock, MD  Relative Name and Phone Number:       Current Level of Care: Hospital Recommended Level of Care: [place] Prior Approval Number:    Date Approved/Denied:   PASRR Number:    Discharge Plan: SNF    Current Diagnoses: Patient Active Problem List   Diagnosis Date Noted  . Alcohol use disorder, severe, dependence ([place]) 11/06/2016  . Aspiration pneumonia due to vomit ([place])   . Alcohol withdrawal seizure ([place]) 10/31/2016  . Thrombocytopenia ([place]) 10/31/2016  . Transaminitis 10/31/2016  . Hypokalemia 10/31/2016    Orientation RESPIRATION BLADDER Height & Weight     Self, Place, Situation  Normal Continent Weight: 182 lb 12.8 oz (82.9 kg) Height:  _0  (172.7 cm)  BEHAVIORAL SYMPTOMS/MOOD NEUROLOGICAL BOWEL NUTRITION STATUS      Incontinent Diet (Regular Diet)  AMBULATORY STATUS COMMUNICATION OF NEEDS Skin   Limited Assist Verbally Normal                       Personal Care Assistance Level of Assistance  Bathing, Feeding, Dressing Bathing Assistance: Limited assistance Feeding assistance: Independent Dressing Assistance: Limited assistance     Functional Limitations Info  Speech, Hearing, Sight Sight Info: Adequate Hearing Info: Adequate Speech Info: Adequate    SPECIAL CARE FACTORS FREQUENCY  PT (By licensed PT), OT (By licensed OT)     PT Frequency: 5 OT Frequency: 5            Contractures Contractures Info: Not present    Additional Factors Info  Code Status, Allergies, Insulin Sliding Scale Code Status Info:  Full Code Allergies Info: No known allergies   Insulin Sliding Scale Info: 6x/day       Current Medications (11/07/2016):  This is the current hospital active medication list Current Facility-Administered Medications  Medication Dose Route Frequency Provider Last Rate Last Dose  . amoxicillin-clavulanate (AUGMENTIN) 875-125 MG per tablet 1 tablet  1 tablet Oral Q12H Dustin Flock, MD   1 tablet at 11/07/16 1248  . chlorhexidine gluconate (MEDLINE KIT) (PERIDEX) 0.12 % solution 15 mL  15 mL Mouth Rinse BID Laverle Hobby, MD   15 mL at 11/06/16 0847  . enoxaparin (LOVENOX) injection 40 mg  40 mg Subcutaneous Q24H Flora Lipps, MD   40 mg at 11/07/16 1547  . famotidine (PEPCID) tablet 20 mg  20 mg Oral BID Pernell Dupre, RPH   20 mg at 11/07/16 1054  . folic acid (FOLVITE) tablet 1 mg  1 mg Oral Daily Hallaji, Sheema M, RPH   1 mg at 11/07/16 1054  . guaiFENesin-dextromethorphan (ROBITUSSIN DM) 100-10 MG/5ML syrup 5 mL  5 mL Oral Q4H PRN Dustin Flock, MD   5 mL at 11/07/16 1100  . hydrALAZINE (APRESOLINE) injection 10 mg  10 mg Intravenous Q4H PRN Awilda Bill, NP      . ibuprofen (ADVIL,MOTRIN) tablet 400 mg  400 mg Oral Q6H PRN Lance Coon, MD   400 mg at 11/04/16 8119  . insulin aspart (novoLOG) injection 0-15 Units  0-15 Units Subcutaneous Q4H Laverle Hobby, MD      .  LORazepam (ATIVAN) injection 1 mg  1 mg Intravenous Q6H PRN Fritzi Mandes, MD      . MEDLINE mouth rinse  15 mL Mouth Rinse 10 times per day Laverle Hobby, MD   15 mL at 11/07/16 1547  . multivitamin with minerals tablet 1 tablet  1 tablet Oral Daily Pernell Dupre, RPH   1 tablet at 11/07/16 1054  . ondansetron (ZOFRAN) tablet 4 mg  4 mg Oral Q6H PRN Lance Coon, MD       Or  . ondansetron [facility]) injection 4 mg  4 mg Intravenous Q6H PRN Lance Coon, MD   4 mg at 11/06/16 1151  . phenol (CHLORASEPTIC) mouth spray 1 spray  1 spray Mouth/Throat PRN Dustin Flock, MD      .  potassium chloride SA (K-DUR,KLOR-CON) CR tablet 40 mEq  40 mEq Oral BID Ramond Dial, RPH   40 mEq at 11/07/16 1547  . sodium chloride flush (NS) 0.9 % injection 10-40 mL  10-40 mL Intracatheter PRN Fritzi Mandes, MD   10 mL at 11/06/16 0846  . thiamine (VITAMIN B-1) tablet 100 mg  100 mg Oral Daily Pernell Dupre, RPH   100 mg at 11/07/16 1054     Discharge Medications: Please see discharge summary for a list of discharge medications.  Relevant Imaging Results:  Relevant Lab Results:   Additional Information SSN:  [id number]  Darden Dates, LCSW

## 2016-11-07 NOTE — Progress Notes (Signed)
Pharmacy Consult for Electrolyte Monitoring  Pharmacy consulted for electrolyte management for 36 yo male requiring mechanical intubation secondary to alcohol withdrawal and seizures.  Pt is now extubated and transferred to the floor. Pt was on lactulose for elevated ammonia. No longer on lactulose and amount of BM appears to have decreased. Pt has a rectal tube.   Plan:  Mg =2 and K = 3.1 after replacement.  Patient has orders to receive another KCl 40 mEq PO this evening - will recheck electrolytes with AM labs tomorrow.   No Known Allergies  Patient Measurements: Height: 5\' 8"  (172.7 cm) Weight: 182 lb 12.8 oz (82.9 kg) IBW/kg (Calculated) : 68.4  Vital Signs: Temp: 98.5 F (36.9 C) (07/26 1303) Temp Source: Oral (07/26 1303) BP: 122/76 (07/26 1303) Pulse Rate: 86 (07/26 1303) Intake/Output from previous day: 07/25 0701 - 07/26 0700 In: 1860 [P.O.:60; I.V.:1000; IV Piggyback:800] Out: 4760 [Urine:4460; Stool:300] Intake/Output from this shift: Total I/O In: 485 [P.O.:360; I.V.:125] Out: 1100 [Urine:1100]  Labs:  Recent Labs  11/05/16 0444 11/06/16 0516 11/07/16 0519 11/07/16 1652  WBC 5.0 4.8  --   --   HGB 13.0 12.6*  --   --   HCT 39.2* 36.7*  --   --   PLT 121* 124*  --   --   CREATININE 1.00 0.37* 0.42*  --   MG 2.2 1.5* 1.5* 2.0  PHOS  --   --  3.7  --   ALBUMIN 2.2*  --   --   --   PROT 5.8*  --   --   --   AST 208*  --   --   --   ALT 103*  --   --   --   ALKPHOS 127*  --   --   --   BILITOT 8.3*  --   --   --    Estimated Creatinine Clearance: 136.5 mL/min (A) (by C-G formula based on SCr of 0.42 mg/dL (L)).  Potassium (mmol/L)  Date Value  11/07/2016 3.1 (L)  11/11/2013 3.5   Sodium (mmol/L)  Date Value  11/07/2016 141  11/11/2013 145   Calcium (mg/dL)  Date Value  40/98/119107/26/2018 8.7 (L)   Calcium, Total (mg/dL)  Date Value  47/82/956207/30/2015 8.2 (L)    Pharmacy will continue to monitor and adjust per consult.   Cindi CarbonMary M Geneal Huebert, PharmD,  BCPS Clinical Pharmacist 11/07/2016 6:27 PM

## 2016-11-07 NOTE — Care Management (Signed)
Received referral for Mr. [name] from Dr. Chyrl CivattePulcilowska requesting arranging [facility] for [facility] IOP. Will sent referral to Clinical Social Worker. Gwenette GreetBrenda S Imaad Reuss RN MSN CCM Care Management [phone]

## 2016-11-07 NOTE — Consult Note (Signed)
[facility] Face-to-Face Psychiatry Consult   Reason for Consult:  Alcoholism. Referring Physician:  Dr. Posey Pronto Patient Identification: Masoud Nyce MRN:  [id number] Principal Diagnosis: Alcohol withdrawal seizure [facility]) Diagnosis:   Patient Active Problem List   Diagnosis Date Noted  . Alcohol use disorder, severe, dependence ([place]) [F10.20] 11/06/2016  . Aspiration pneumonia due to vomit ([place]) [J69.0]   . Alcohol withdrawal seizure ([place]) [I94.854, R56.9] 10/31/2016  . Thrombocytopenia ([place]) [D69.6] 10/31/2016  . Transaminitis [R74.0] 10/31/2016  . Hypokalemia [E87.6] 10/31/2016    Total Time spent with patient: 1 hour   11/07/2016. Follow up. I spoke with the wife last night who is pressing for residential treatment but the patient is unable to commit. He wants to call his job first. If he still has a job, which I doubt, he is not interested I  Treatment. He denies any symptoms of depression, anxiety or psychosis. He is not suicidal or homicidal. We discussed outpatient, inpatient, and pharmacological treatment options, as well as an option to do nothing. He indicates that he would be interested in taking Antabuse.   Identifying data. Mr. Fini is a 36 year old male with a history of alcoholism.  Chief complaint. "I want help."  History of present illness. Information was obtained from the patient, the chart and his wife. The patient was brought to the emergency room after witnessed episode of seizures at work this was related to alcohol withdrawal. He had delirium tremens and was transferred to ICU for treatment. Psychiatry was asked to evaluate him for substance abuse. The patient has difficulty speaking due to sore throat from intubation. His voice is low and raspy difficult to understand. He now tells me that he wants help for alcoholism but is uncertain how to go about it. Unfortunately he already indicates that he wants to return to work as quickly as possible and is asking for a note. The  patient denies any symptoms of depression, anxiety, or psychosis. He is not suicidal or homicidal. He does not use drugs other than alcohol. For the past 2 weeks he has been drinking very heavily and did not go to work.  Past psychiatric history. I spoke with his wife extensively. She reports that he has been drinking since the age of 82 socially. 4 years ago his drinking escalated resulting in him losing that job. He then started drinking even more and for the past 2 years has been drinking daily beer and cheap liquor. 2 years ago he went for alcohol treatment to someplace in [place] for a week but relapsed on alcohol almost immediately. He has never suffered mental illness, attempted suicide, was hospitalized.  Family psychiatric history. His great grandfather on paternal side died of alcoholism. He has a maternal uncle who is an alcoholic.  Social history. He lives with his wife and children. 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  Risk to Self: Is patient at risk for suicide?: No Risk to Others:   Prior Inpatient Therapy:   Prior Outpatient Therapy:    Past Medical History:  Past Medical History:  Diagnosis Date  . Alcohol abuse   . Heart murmur   . Seizures ([place])     Past Surgical History:  Procedure Laterality Date  . HAND SURGERY     Family History:  Family History  Problem Relation Age of Onset  . Family  history unknown: Yes   Social History:  History  Alcohol Use  . 33.6 oz/week  . 4 Cans of beer per week     History  Drug Use No    Social History   Social History  . Marital status: Married    Spouse name: N/A  . Number of children: N/A  . Years of education: N/A   Social History Main Topics  . Smoking status: Never Smoker  . Smokeless tobacco: Never Used  . Alcohol use 33.6 oz/week    56 Cans of beer per week  . Drug use: No  .  Sexual activity: Not Asked   Other Topics Concern  . None   Social History Narrative  . None   Additional Social History:    Allergies:  No Known Allergies  Labs:  Results for orders placed or performed during the hospital encounter of 10/31/16 (from the past 48 hour(s))  Blood gas, arterial     Status: Abnormal (Preliminary result)   Collection Time: 11/05/16  9:18 AM  Result Value Ref Range   FIO2 0.35    Delivery systems VENTILATOR    Mode SPONTANEOUS BREATHING TRIAL    Peep/cpap 5.0 cm H20   Pressure support 5.0 cm H20   pH, Arterial 7.28 (L) 7.350 - 7.450   pCO2 arterial 45 32.0 - 48.0 mmHg   pO2, Arterial 68 (L) 83.0 - 108.0 mmHg   Bicarbonate 21.1 20.0 - 28.0 mmol/L   Acid-base deficit 5.7 (H) 0.0 - 2.0 mmol/L   O2 Saturation 90.6 %   Patient temperature 37.0    Collection site RIGHT RADIAL    Sample type ARTERIAL DRAW    Allens test (pass/fail) PASS PASS   Mechanical Rate PENDING   Glucose, capillary     Status: Abnormal   Collection Time: 11/05/16 11:45 AM  Result Value Ref Range   Glucose-Capillary 109 (H) 65 - 99 mg/dL  Ammonia     Status: Abnormal   Collection Time: 11/05/16 12:21 PM  Result Value Ref Range   Ammonia 69 (H) 9 - 35 umol/L  Glucose, capillary     Status: Abnormal   Collection Time: 11/05/16  4:24 PM  Result Value Ref Range   Glucose-Capillary 110 (H) 65 - 99 mg/dL  Glucose, capillary     Status: None   Collection Time: 11/05/16  7:31 PM  Result Value Ref Range   Glucose-Capillary 82 65 - 99 mg/dL   Comment 1 Notify RN    Comment 2 Document in Chart   Glucose, capillary     Status: None   Collection Time: 11/05/16 11:52 PM  Result Value Ref Range   Glucose-Capillary 91 65 - 99 mg/dL   Comment 1 Notify RN    Comment 2 Document in Chart   Glucose, capillary     Status: None   Collection Time: 11/06/16  3:45 AM  Result Value Ref Range   Glucose-Capillary 72 65 - 99 mg/dL   Comment 1 Notify RN    Comment 2 Document in Chart   CBC      Status: Abnormal   Collection Time: 11/06/16  5:16 AM  Result Value Ref Range   WBC 4.8 3.8 - 10.6 K/uL   RBC 3.66 (L) 4.40 - 5.90 MIL/uL   Hemoglobin 12.6 (L) 13.0 - 18.0 g/dL   HCT 36.7 (L) 40.0 - 52.0 %   MCV 100.2 (H) 80.0 - 100.0 fL   MCH 34.4 (H) 26.0 - 34.0 pg  MCHC 34.3 32.0 - 36.0 g/dL   RDW 16.7 (H) 11.5 - 14.5 %   Platelets 124 (L) 150 - 440 K/uL  Basic metabolic panel     Status: Abnormal   Collection Time: 11/06/16  5:16 AM  Result Value Ref Range   Sodium 146 (H) 135 - 145 mmol/L   Potassium 2.5 (LL) 3.5 - 5.1 mmol/L    Comment: CRITICAL RESULT CALLED TO, READ BACK BY AND VERIFIED WITH  TESSTESS THOMAS AT 0554 11/06/16 SDR    Chloride 112 (H) 101 - 111 mmol/L   CO2 28 22 - 32 mmol/L   Glucose, Bld 95 65 - 99 mg/dL   BUN 7 6 - 20 mg/dL   Creatinine, Ser 0.37 (L) 0.61 - 1.24 mg/dL   Calcium 8.4 (L) 8.9 - 10.3 mg/dL   GFR calc non Af Amer >60 >60 mL/min   GFR calc Af Amer >60 >60 mL/min    Comment: (NOTE) The eGFR has been calculated using the CKD EPI equation. This calculation has not been validated in all clinical situations. eGFR's persistently <60 mL/min signify possible Chronic Kidney Disease.    Anion gap 6 5 - 15  Magnesium     Status: Abnormal   Collection Time: 11/06/16  5:16 AM  Result Value Ref Range   Magnesium 1.5 (L) 1.7 - 2.4 mg/dL  Glucose, capillary     Status: None   Collection Time: 11/06/16  7:32 AM  Result Value Ref Range   Glucose-Capillary 76 65 - 99 mg/dL   Comment 1 Notify RN   Glucose, capillary     Status: None   Collection Time: 11/06/16 11:24 AM  Result Value Ref Range   Glucose-Capillary 81 65 - 99 mg/dL   Comment 1 Notify RN   Ammonia     Status: Abnormal   Collection Time: 11/06/16 11:49 AM  Result Value Ref Range   Ammonia 67 (H) 9 - 35 umol/L  Triglycerides     Status: Abnormal   Collection Time: 11/06/16 11:55 AM  Result Value Ref Range   Triglycerides 276 (H) <150 mg/dL  Glucose, capillary     Status: None    Collection Time: 11/06/16  4:53 PM  Result Value Ref Range   Glucose-Capillary 80 65 - 99 mg/dL  Potassium     Status: Abnormal   Collection Time: 11/06/16  6:29 PM  Result Value Ref Range   Potassium 2.9 (L) 3.5 - 5.1 mmol/L  Glucose, capillary     Status: None   Collection Time: 11/06/16  7:53 PM  Result Value Ref Range   Glucose-Capillary 81 65 - 99 mg/dL  Glucose, capillary     Status: None   Collection Time: 11/07/16 12:02 AM  Result Value Ref Range   Glucose-Capillary 85 65 - 99 mg/dL  Glucose, capillary     Status: None   Collection Time: 11/07/16  4:29 AM  Result Value Ref Range   Glucose-Capillary 98 65 - 99 mg/dL  Basic metabolic panel     Status: Abnormal   Collection Time: 11/07/16  5:19 AM  Result Value Ref Range   Sodium 141 135 - 145 mmol/L   Potassium 2.9 (L) 3.5 - 5.1 mmol/L   Chloride 106 101 - 111 mmol/L   CO2 29 22 - 32 mmol/L   Glucose, Bld 89 65 - 99 mg/dL   BUN <5 (L) 6 - 20 mg/dL   Creatinine, Ser 0.42 (L) 0.61 - 1.24 mg/dL   Calcium 8.7 (L)  8.9 - 10.3 mg/dL   GFR calc non Af Amer >60 >60 mL/min   GFR calc Af Amer >60 >60 mL/min    Comment: (NOTE) The eGFR has been calculated using the CKD EPI equation. This calculation has not been validated in all clinical situations. eGFR's persistently <60 mL/min signify possible Chronic Kidney Disease.    Anion gap 6 5 - 15  Magnesium     Status: Abnormal   Collection Time: 11/07/16  5:19 AM  Result Value Ref Range   Magnesium 1.5 (L) 1.7 - 2.4 mg/dL  Phosphorus     Status: None   Collection Time: 11/07/16  5:19 AM  Result Value Ref Range   Phosphorus 3.7 2.5 - 4.6 mg/dL  Glucose, capillary     Status: None   Collection Time: 11/07/16  7:40 AM  Result Value Ref Range   Glucose-Capillary 80 65 - 99 mg/dL    Current Facility-Administered Medications  Medication Dose Route Frequency Provider Last Rate Last Dose  . Ampicillin-Sulbactam (UNASYN) 3 g in sodium chloride 0.9 % 100 mL IVPB  3 g Intravenous  Q6H Fritzi Mandes, MD   Stopped at 11/07/16 [phone]  . chlorhexidine gluconate (MEDLINE KIT) (PERIDEX) 0.12 % solution 15 mL  15 mL Mouth Rinse BID Laverle Hobby, MD   15 mL at 11/06/16 0847  . dextrose 5 % with KCl 20 mEq / L  infusion  20 mEq Intravenous Continuous Maccia, Melissa D, RPH      . enoxaparin (LOVENOX) injection 40 mg  40 mg Subcutaneous Q24H Flora Lipps, MD   40 mg at 11/06/16 1700  . famotidine (PEPCID) tablet 20 mg  20 mg Oral BID Pernell Dupre, RPH   20 mg at 11/06/16 2241  . folic acid (FOLVITE) tablet 1 mg  1 mg Oral Daily Hallaji, Dani Gobble, RPH      . guaiFENesin-dextromethorphan (ROBITUSSIN DM) 100-10 MG/5ML syrup 5 mL  5 mL Oral Q4H PRN Dustin Flock, MD   5 mL at 11/07/16 0347  . hydrALAZINE (APRESOLINE) injection 10 mg  10 mg Intravenous Q4H PRN Awilda Bill, NP      . ibuprofen (ADVIL,MOTRIN) tablet 400 mg  400 mg Oral Q6H PRN Lance Coon, MD   400 mg at 11/04/16 9604  . insulin aspart (novoLOG) injection 0-15 Units  0-15 Units Subcutaneous Q4H Laverle Hobby, MD      . LORazepam (ATIVAN) injection 1 mg  1 mg Intravenous Q6H PRN Fritzi Mandes, MD      . magnesium sulfate IVPB 4 g 100 mL  4 g Intravenous Once Ramond Dial, RPH      . MEDLINE mouth rinse  15 mL Mouth Rinse 10 times per day Laverle Hobby, MD   15 mL at 11/07/16 0225  . multivitamin with minerals tablet 1 tablet  1 tablet Oral Daily Hallaji, Sheema M, RPH      . ondansetron (ZOFRAN) tablet 4 mg  4 mg Oral Q6H PRN Lance Coon, MD       Or  . ondansetron [facility]) injection 4 mg  4 mg Intravenous Q6H PRN Lance Coon, MD   4 mg at 11/06/16 1151  . phenol (CHLORASEPTIC) mouth spray 1 spray  1 spray Mouth/Throat PRN Dustin Flock, MD      . potassium chloride 10 mEq in 100 mL IVPB  10 mEq Intravenous Q1 Hr x 5 Maccia, Melissa D, RPH      . sodium chloride flush (NS) 0.9 % injection 10-40 mL  10-40 mL Intracatheter PRN Fritzi Mandes, MD   10 mL at 11/06/16 0846  . thiamine  (VITAMIN B-1) tablet 100 mg  100 mg Oral Daily Hallaji, Dani Gobble, RPH        Musculoskeletal: Strength & Muscle Tone: within normal limits Gait & Station: normal Patient leans: N/A  Psychiatric Specialty Exam: I reviewed physical examination performed by medical provider and agree with the findings. Physical Exam  Nursing note and vitals reviewed. Psychiatric: Thought content normal. His affect is blunt. His speech is slurred. He is slowed and withdrawn. Cognition and memory are normal. He expresses impulsivity.    Review of Systems  HENT: Positive for sore throat.   Neurological: Positive for seizures.  Psychiatric/Behavioral: Positive for substance abuse.  All other systems reviewed and are negative.   Blood pressure 123/77, pulse 76, temperature 99.3 F (37.4 C), temperature source Axillary, resp. rate 18, height _0  (1.727 m), weight 82.9 kg (182 lb 12.8 oz), SpO2 99 %.Body mass index is 27.79 kg/m.  General Appearance: Fairly Groomed  Eye Contact:  Good  Speech:  Garbled  Volume:  Decreased  Mood:  Depressed  Affect:  Blunt and Congruent  Thought Process:  Goal Directed and Descriptions of Associations: Intact  Orientation:  Full (Time, Place, and Person)  Thought Content:  WDL  Suicidal Thoughts:  No  Homicidal Thoughts:  No  Memory:  Immediate;   Fair Recent;   Fair Remote;   Fair  Judgement:  Poor  Insight:  Lacking  Psychomotor Activity:  Psychomotor Retardation  Concentration:  Concentration: Fair and Attention Span: Fair  Recall:  [employer] of Knowledge:  Fair  Language:  Fair  Akathisia:  No  Handed:  Right  AIMS (if indicated):     Assets:  Communication Skills Desire for Improvement Housing Intimacy Physical Health Resilience Social Support Transportation  ADL's:  Intact  Cognition:  WNL  Sleep:        Treatment Plan Summary: Daily contact with patient to assess and evaluate symptoms and progress in treatment and Medication management    PLAN:  1. I spoke with the wife who strongly supports residential substance abuse treatment.   2. The patient however could not commit today either.  3. He is leaning towards accepting Antabuse. First dose should be given before discharge.   4. He may follow up with substance abuse intensive outpatient program at [facility]. He wants to call his job first.  5. I will ask for case manager consult to arrange for follow up.    Disposition: No evidence of imminent risk to self or others at present.   Patient does not meet criteria for psychiatric inpatient admission. Supportive therapy provided about ongoing stressors. Refer to IOP. Discussed crisis plan, support from social network, calling 911, coming to the Emergency Department, and calling Suicide Hotline.  Orson Slick, MD 11/07/2016 8:54 AM

## 2016-11-07 NOTE — Evaluation (Addendum)
Physical Therapy Evaluation Patient Details Name: Brett Washington MRN: [id number] DOB: Apr 28, 1980 Today's Date: 11/07/2016   History of Present Illness  presented to ER and admitted for acute hospitalization status post seizure activity related to ETOH withdrawal. Hospital course complicated by confusion and agitation related to severe DTs, requiring transfer to CCU for precedex drip and subsequent intubation (7/22-7/24) for airway protection.  Currently weaned to 2L supplemental O2, alert, oriented and cooperative with care.  Of note, patient with recent history (April, 2018) of L distal ulnar subluxation and L radial nerve palsy (secondary to fall with unknown down time); was wearing a velcro wrist brace, unsure of current wearing schedule/ROM/WBing restrictions.  Clinical Impression  Upon evaluation, patient alert and oriented; follows all commands and demonstrates fair insight/safety awareness.  Mildly impulsive, limited insight into deficits.  Bilat UE > LE generally weak and deconditioned (baseline L wrist, hand, finger weakness due to nerve palsy); requires act assist from therapist to mobilize bilat UEs throughout full range.  Currently requiring min assist for truncal elevation with supine/sit; min/mod assist for sit/stand, basic transfers and short-distance (25') gait without assist device.  Intermittent R posterior/lateral lean with unsupported sitting balance, min assist to recover; increased sway in all planes with standing activities, mild/moderate tremulousness in bilat LEs.  High risk for buckling, LOB and fall. May plan to trial RW/assist device next session if cleared for WBing/active use of L UE (message sent to ortho). Patient very motivated and willing/eager to participate with therapy services during hospital stay.  Excellent potential to meet mobility goals with appropriate-level services. Per RN, supplemental O2 removed during session; mild desat to 88% with exertion, but recovers to  >90% within 2 seconds of seated rest. Would benefit from skilled PT to address above deficits and promote optimal return to PLOF; recommend transition to acute inpatient rehab upon discharge for high-intensity, post-acute rehab services.  Patient fluent in [place]; no difficulty with communication.  Interpreter not required during session.  Medical interpreter, Wilford Corner, present for initial portion of session to ensure fluency; verified patient's understanding and communication in English, no need for interpreter.      Follow Up Recommendations CIR    Equipment Recommendations       Recommendations for Other Services       Precautions / Restrictions Precautions Precautions: Fall Restrictions Weight Bearing Restrictions: No      Mobility  Bed Mobility Overal bed mobility: Needs Assistance Bed Mobility: Supine to Sit;Sit to Supine     Supine to sit: Min guard Sit to supine: Supervision   General bed mobility comments: heavy use of bedrails to assist with truncal elevation  Transfers Overall transfer level: Needs assistance   Transfers: Sit to/from Stand Sit to Stand: Mod assist         General transfer comment: broad BOS, min/mod assist for trunk control, lift off and standing balance.  LEs generally tremulous in closed-chain position; high risk for buckling.  Poor balance reactions in all planes.    Ambulation/Gait Ambulation/Gait assistance: Min assist;Mod assist Ambulation Distance (Feet): 25 Feet         General Gait Details: shuffling steps with decreased step height/length, decreased cadence/gait speed; guarded trunk posturing with limited rotation/arm swing.  Poor balance; high fall risk.  Would benefit from trial of RW in subsequent trials if cleared by ortho.  Stairs            Wheelchair Mobility    Modified Rankin (Stroke Patients Only)  Balance Overall balance assessment: Needs assistance Sitting-balance support: No upper  extremity supported;Feet supported Sitting balance-Leahy Scale: Fair Sitting balance - Comments: R posterior/lateral lean with fatigue/dynamic activity, min assist to recover   Standing balance support: No upper extremity supported Standing balance-Leahy Scale: Poor Standing balance comment: increased sway in all planes, constant assist from therapist to maintain balance Single Leg Stance - Right Leg: 0 Single Leg Stance - Left Leg: 0                         Pertinent Vitals/Pain Pain Assessment: No/denies pain    Home Living Family/patient expects to be discharged to:: Private residence Living Arrangements: Spouse/significant other Available Help at Discharge: Family                  Prior Function Level of Independence: Independent         Comments: Indep with ADL, household and community mobility; 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  Does endorse recent fall history (related to ETOH vs pathology)     Hand Dominance        Extremity/Trunk Assessment   Upper Extremity Assessment Upper Extremity Assessment:  (R UE grossly 3-/5 throughout shoulder, 4-/5 elbow and hand; L shoulder and elbow 3-/5, wrist and finger flexion 3-/5, extension 1/5.  Decreased sensory awareness L hand (previous radial nerve injury))    Lower Extremity Assessment Lower Extremity Assessment:  (bilat LEs grossly 4-/5 throughout; denies numbness/paresthesia)       Communication   Communication: No difficulties  Cognition Arousal/Alertness: Awake/alert Behavior During Therapy: WFL for tasks assessed/performed Overall Cognitive Status: Within Functional Limits for tasks assessed                                        General Comments      Exercises Other Exercises Other Exercises: Sit/stand and static stance without assist device, min/mod assist-working to develop awareness of postural control/balance reactions in all planes.  LEs generally  tremulous, high risk for buckling.  Tends to sit spontaneously vs attempt balance recovery with significant posterior weight shift. Other Exercises: Educated in position of L UE to maintain ROM.  Encouraged neutral wrist, hand and finger position at rest.  Patient voiced understanding, but demonstrates limited carry-over during session   Assessment/Plan    PT Assessment Patient needs continued PT services  PT Problem List Decreased strength;Decreased range of motion;Decreased activity tolerance;Decreased balance;Decreased mobility;Decreased coordination;Decreased cognition;Decreased knowledge of use of DME;Decreased safety awareness;Decreased knowledge of precautions       PT Treatment Interventions DME instruction;Gait training;Stair training;Functional mobility training;Therapeutic activities;Therapeutic exercise;Balance training;Patient/family education    PT Goals (Current goals can be found in the Care Plan section)  Acute Rehab PT Goals Patient Stated Goal: to get stronger PT Goal Formulation: With patient Time For Goal Achievement: 11/21/16 Potential to Achieve Goals: Good    Frequency 7X/week   Barriers to discharge Decreased caregiver support      Co-evaluation               AM-PAC PT "6 Clicks" Daily Activity  Outcome Measure Difficulty turning over in bed (including adjusting bedclothes, sheets and blankets)?: Total Difficulty moving from lying on back to sitting on the side of the bed? : Total Difficulty sitting down on and standing up from a chair with arms (e.g., wheelchair, bedside commode, etc,.)?: Total Help needed  moving to and from a bed to chair (including a wheelchair)?: A Lot Help needed walking in hospital room?: A Lot Help needed climbing 3-5 steps with a railing? : A Lot 6 Click Score: 9    End of Session Equipment Utilized During Treatment: Gait belt Activity Tolerance: Patient tolerated treatment well Patient left: in bed;with call bell/phone  within reach;with bed alarm set;with family/visitor present   PT Visit Diagnosis: Difficulty in walking, not elsewhere classified (R26.2);Muscle weakness (generalized) (M62.81)    Time: 1140-1210 PT Time Calculation (min) (ACUTE ONLY): 30 min   Charges:   PT Evaluation $PT Eval Low Complexity: 1 Procedure PT Treatments $Therapeutic Activity: 8-22 mins   PT G Codes:   PT G-Codes **NOT FOR INPATIENT CLASS** Functional Assessment Tool Used: AM-PAC 6 Clicks Basic Mobility Functional Limitation: Mobility: Walking and moving around Mobility: Walking and Moving Around Current Status ([id number](G8978): At least 40 percent but less than 60 percent impaired, limited or restricted Mobility: Walking and Moving Around Goal Status [id number]([id number]): At least 1 percent but less than 20 percent impaired, limited or restricted    Walid Haig H. Manson PasseyBrown, PT, DPT, NCS 11/07/16, 2:41 PM [phone]

## 2016-11-07 NOTE — Progress Notes (Addendum)
Pharmacy Consult for Electrolyte Monitoring  Pharmacy consulted for electrolyte management for 36 yo male requiring mechanical intubation secondary to alcohol withdrawal and seizures.  Pt is now extubated and transferred to the floor. Pt was on lactulose for elevated ammonia. No longer on lactulose and amount of BM appears to have decreased. Pt has a rectal tube.   Plan:  7/26 AM: K+ 2.5, Mg: 1.5  Pt received Mg IV 2 g and KCL 100 MEQ po and 40 MEQ IV yesterday. K and Mg have not budged.  Will replace Mg FIRST with Mg 4g IV once Then KCL IV 10 MEQ x5 Will add K to maitenance fluids- D5W / 20 MEQ KCL @ 15725ml/hr (2.25 MEQ/hr) Recheck K and Mg at 1700  Addendum- pt did not tolerate IV KCL- changed to KCL 40 po BID   No Known Allergies  Patient Measurements: Height: 5\' 8"  (172.7 cm) Weight: 182 lb 12.8 oz (82.9 kg) IBW/kg (Calculated) : 68.4  Vital Signs: Temp: 99.3 F (37.4 C) (07/26 0431) Temp Source: Axillary (07/26 0431) BP: 123/77 (07/26 0431) Pulse Rate: 76 (07/26 0431) Intake/Output from previous day: 07/25 0701 - 07/26 0700 In: 1860 [P.O.:60; I.V.:1000; IV Piggyback:800] Out: 4760 [Urine:4460; Stool:300] Intake/Output from this shift: Total I/O In: -  Out: 400 [Urine:400]  Labs:  Recent Labs  11/05/16 0444 11/06/16 0516 11/07/16 0519  WBC 5.0 4.8  --   HGB 13.0 12.6*  --   HCT 39.2* 36.7*  --   PLT 121* 124*  --   CREATININE 1.00 0.37* 0.42*  MG 2.2 1.5* 1.5*  PHOS  --   --  3.7  ALBUMIN 2.2*  --   --   PROT 5.8*  --   --   AST 208*  --   --   ALT 103*  --   --   ALKPHOS 127*  --   --   BILITOT 8.3*  --   --    Estimated Creatinine Clearance: 136.5 mL/min (A) (by C-G formula based on SCr of 0.42 mg/dL (L)).  Potassium (mmol/L)  Date Value  11/07/2016 2.9 (L)  11/11/2013 3.5   Sodium (mmol/L)  Date Value  11/07/2016 141  11/11/2013 145   Calcium (mg/dL)  Date Value  54/09/811907/26/2018 8.7 (L)   Calcium, Total (mg/dL)  Date Value  14/78/295607/30/2015 8.2  (L)    Pharmacy will continue to monitor and adjust per consult.   Olene FlossMelissa D Lamar Meter, PharmD, BCPS Clinical Pharmacist 11/07/2016 8:00 AM

## 2016-11-08 LAB — GLUCOSE, CAPILLARY
GLUCOSE-CAPILLARY: 81 mg/dL (ref 65–99)
Glucose-Capillary: 86 mg/dL (ref 65–99)
Glucose-Capillary: 74 mg/dL (ref 65–99)

## 2016-11-08 LAB — POTASSIUM: Potassium: 3.3 mmol/L — ABNORMAL LOW (ref 3.5–5.1)

## 2016-11-08 LAB — MAGNESIUM: Magnesium: 1.7 mg/dL (ref 1.7–2.4)

## 2016-11-08 MED ORDER — PHENOL 1.4 % MT LIQD
1.0000 | OROMUCOSAL | Status: DC | PRN
  Administered 2016-11-08 (×2): 1 via OROMUCOSAL
  Filled 2016-11-08: qty 177

## 2016-11-08 MED ORDER — NYSTATIN 100000 UNIT/ML MT SUSP
5.0000 mL | Freq: Four times a day (QID) | OROMUCOSAL | Status: DC
  Administered 2016-11-08 – 2016-11-11 (×13): 500000 [IU] via OROMUCOSAL
  Filled 2016-11-08 (×13): qty 5

## 2016-11-08 MED ORDER — MAGNESIUM OXIDE 400 (241.3 MG) MG PO TABS
400.0000 mg | ORAL_TABLET | Freq: Two times a day (BID) | ORAL | Status: DC
  Administered 2016-11-08 – 2016-11-11 (×7): 400 mg via ORAL
  Filled 2016-11-08 (×7): qty 1

## 2016-11-08 MED ORDER — FLUCONAZOLE 100 MG PO TABS
150.0000 mg | ORAL_TABLET | Freq: Once | ORAL | Status: AC
  Administered 2016-11-08: 14:00:00 150 mg via ORAL
  Filled 2016-11-08: qty 1.5

## 2016-11-08 MED ORDER — FLUCONAZOLE 100 MG PO TABS
150.0000 mg | ORAL_TABLET | Freq: Once | ORAL | Status: DC
  Filled 2016-11-08: qty 1

## 2016-11-08 MED ORDER — MAGNESIUM SULFATE 2 GM/50ML IV SOLN
2.0000 g | Freq: Once | INTRAVENOUS | Status: AC
  Administered 2016-11-08: 2 g via INTRAVENOUS
  Filled 2016-11-08: qty 50

## 2016-11-08 NOTE — Care Management (Signed)
Spoke with Dr. Allena KatzPatel. States he has spoke with Mr & Ms [name] at the bedside. They have decided that [place] would like to go [place]. If [place]'t [place] he would like to go to a Skilled Nursing facility locally. Gwenette GreetBrenda S Soraida Vickers RN MSN CCM Care Management [phone]

## 2016-11-08 NOTE — Progress Notes (Signed)
Physical Therapy Treatment Patient Details Name: Brett FinerJose Washington MRN: [id number] DOB: 09/02/1980 Today's Date: 11/08/2016    History of Present Illness presented to ER and admitted for acute hospitalization status post seizure activity related to ETOH withdrawal. Hospital course complicated by confusion and agitation related to severe DTs, requiring transfer to CCU for precedex drip and subsequent intubation (7/22-7/24) for airway protection.  Currently weaned to 2L supplemental O2, alert, oriented and cooperative with care.  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  Per Dr. Rosita KeaMenz, no WBing/ROM restrictions, brace no longer required at this time.    PT Comments    Patient very eager for continued participation/progress with PT session.  Excellent effort with all therapeutic tasks.  Mobility improved from previous session, but continues with poor standing balance (R > L LE) and poor dynamic balance reactions.  Very high fall risk, confirmed with BERG assessment 23/56. Would benefit from continued use of assist device with mobility trials and continued strengthening of core and proximal musculature in subsequent sessions. Patient/wife very hopeful for transfer to CIR, hopeful that he is able to recover maximal functional ability and start new upon discharge.   Follow Up Recommendations  CIR     Equipment Recommendations       Recommendations for Other Services       Precautions / Restrictions Precautions Precautions: Fall Restrictions Weight Bearing Restrictions: No    Mobility  Bed Mobility Overal bed mobility: Modified Independent                Transfers Overall transfer level: Needs assistance   Transfers: Sit to/from Stand Sit to Stand: Min assist         General transfer comment: multiple attempts required with initial  sit/stand reps, improving in subsequent repeittions  Ambulation/Gait Ambulation/Gait assistance: Min assist;Mod assist Ambulation Distance (Feet): 100 Feet Assistive device: None     Gait velocity interpretation: <1.8 ft/sec, indicative of risk for recurrent falls General Gait Details: staggered stepping pattern with inconsistent step height/length; frequent lateral sway with limited ability to correct (min/mod assist from therapist).  Significant gait deviation/LOB with dynamic gait components (unable to complete head turns, changes of direction).  Slow, guarded gait performance (limited trunk rotation and arm swing); limited ability to modulate speed.   Stairs            Wheelchair Mobility    Modified Rankin (Stroke Patients Only)       Balance Overall balance assessment: Needs assistance Sitting-balance support: No upper extremity supported;Feet supported Sitting balance-Leahy Scale: Fair     Standing balance support: No upper extremity supported Standing balance-Leahy Scale: Poor                   Standardized Balance Assessment Standardized Balance Assessment : Berg Balance Test Berg Balance Test Sit to Stand: Able to stand using hands after several tries Standing Unsupported: Able to stand 2 minutes with supervision Sitting with Back Unsupported but Feet Supported on Floor or Stool: Able to sit safely and securely 2 minutes Stand to Sit: Sits safely with minimal use of hands Transfers: Needs one person to assist Standing Unsupported with Eyes Closed: Able to stand 10 seconds with supervision Standing Ubsupported with Feet Together: Needs help to attain position and unable to hold for 15 seconds From Standing, Reach Forward with Outstretched Arm: Reaches forward but needs  supervision From Standing Position, Pick up Object from Floor: Able to pick up shoe, needs supervision From Standing Position, Turn to Look Behind Over each Shoulder: Needs supervision  when turning Turn 360 Degrees: Needs close supervision or verbal cueing Standing Unsupported, Alternately Place Feet on Step/Stool: Needs assistance to keep from falling or unable to try Standing Unsupported, One Foot in Front: Loses balance while stepping or standing Standing on One Leg: Unable to try or needs assist to prevent fall Total Score: 23        Cognition Arousal/Alertness: Awake/alert Behavior During Therapy: WFL for tasks assessed/performed (very eager, mildly impulsive) Overall Cognitive Status: Within Functional Limits for tasks assessed                                        Exercises Other Exercises Other Exercises: Sit/stand without UE support, 1x10, min assist-emphasis on LE Strength/power Other Exercises: Educated in seated HEP for core/LE strengthening-alternate UE/LE flex/ext, LE t-exercise (LAQs with hip abduct/adduct) and lateral scooting edge of bed.  Returns demonstration with min cuing for technique.  Very eager/engaged to learn exercises to complete outside of therapy. Other Exercises: 100' with RW, min assist-improved stability/balance, but requires constant cuing for walker position and postural extension/upward gaze    General Comments        Pertinent Vitals/Pain Pain Assessment: No/denies pain    Home Living                      Prior Function            PT Goals (current goals can now be found in the care plan section) Acute Rehab PT Goals Patient Stated Goal: to get stronger PT Goal Formulation: With patient Time For Goal Achievement: 11/21/16 Progress towards PT goals: Progressing toward goals    Frequency    7X/week      PT Plan Current plan remains appropriate    Co-evaluation              AM-PAC PT "6 Clicks" Daily Activity  Outcome Measure  Difficulty turning over in bed (including adjusting bedclothes, sheets and blankets)?: None Difficulty moving from lying on back to sitting on the side  of the bed? : None Difficulty sitting down on and standing up from a chair with arms (e.g., wheelchair, bedside commode, etc,.)?: Total Help needed moving to and from a bed to chair (including a wheelchair)?: A Little Help needed walking in hospital room?: A Little Help needed climbing 3-5 steps with a railing? : A Lot 6 Click Score: 17    End of Session Equipment Utilized During Treatment: Gait belt Activity Tolerance: Patient tolerated treatment well Patient left: in bed;with call bell/phone within reach;with bed alarm set;with family/visitor present Nurse Communication: Mobility status PT Visit Diagnosis: Difficulty in walking, not elsewhere classified (R26.2);Muscle weakness (generalized) (M62.81)     Time: [id number] PT Time Calculation (min) (ACUTE ONLY): 32 min  Charges:  $Gait Training: 8-22 mins $Therapeutic Exercise: 8-22 mins                    G Codes:      Cova Knieriem H. Manson PasseyBrown, PT, DPT, NCS 11/08/16, 9:23 PM [phone]

## 2016-11-08 NOTE — Progress Notes (Signed)
Inpatient Rehabilitation  [facility] notifying that OT note was in.  Reviewed therapy recommendations as well as medical status.  As of today we do not have a bed available.  Additionally, will follow for timing of patient being off CIWA protocol and if there are ongoing therapy needs at that time.  Discussed with Steward DroneBrenda.  Will follow up Monday if the patient remains in house.  Please call with questions.    Charlane FerrettiMelissa Trayon Krantz, M.A., CCC/SLP Admission Coordinator  [facility]  Cell [phone]

## 2016-11-08 NOTE — Progress Notes (Signed)
Pharmacy Consult for Electrolyte Monitoring  Pharmacy consulted for electrolyte management for 36 yo male requiring mechanical intubation secondary to alcohol withdrawal and seizures.  Pt is now extubated and transferred to the floor. Pt was on lactulose for elevated ammonia. No longer on lactulose and amount of BM appears to have decreased. Pt has a rectal tube.  Mg goal >2 in the setting of seizure   Plan:  Mg =1.7 K=3.3 Give Mg IV 2 g x 1 I will start Mag ox 400mg  BID for a goal Mg >2 KCL 40 MEQ po BID Recheck in the AM   No Known Allergies  Patient Measurements: Height: 5\' 8"  (172.7 cm) Weight: 182 lb 14.4 oz (83 kg) IBW/kg (Calculated) : 68.4  Vital Signs: Temp: 98.5 F (36.9 C) (07/27 0407) Temp Source: Oral (07/27 0407) BP: 120/70 (07/27 0407) Pulse Rate: 81 (07/27 0407) Intake/Output from previous day: 07/26 0701 - 07/27 0700 In: 725 [P.O.:600; I.V.:125] Out: 1475 [Urine:1475] Intake/Output from this shift: No intake/output data recorded.  Labs:  Recent Labs  11/06/16 0516 11/07/16 0519 11/07/16 1652 11/08/16 0333  WBC 4.8  --   --   --   HGB 12.6*  --   --   --   HCT 36.7*  --   --   --   PLT 124*  --   --   --   CREATININE 0.37* 0.42*  --   --   MG 1.5* 1.5* 2.0 1.7  PHOS  --  3.7  --   --    Estimated Creatinine Clearance: 136.5 mL/min (A) (by C-G formula based on SCr of 0.42 mg/dL (L)).  Potassium (mmol/L)  Date Value  11/08/2016 3.3 (L)  11/11/2013 3.5   Sodium (mmol/L)  Date Value  11/07/2016 141  11/11/2013 145   Calcium (mg/dL)  Date Value  29/56/213007/26/2018 8.7 (L)   Calcium, Total (mg/dL)  Date Value  86/57/846907/30/2015 8.2 (L)    Pharmacy will continue to monitor and adjust per consult.   Olene FlossMelissa D Maccia, PharmD, BCPS Clinical Pharmacist 11/08/2016 7:19 AM

## 2016-11-08 NOTE — Progress Notes (Signed)
SLP Cancellation Note  Patient Details Name: Brett Washington MRN: [id number] DOB: 08/15/1980   Cancelled treatment:       Reason Eval/Treat Not Completed: SLP screened, no needs identified, will sign off after follow up again in the morning to ensure safety due to reports from PAT that pt has had some wet vocal quality and possible deconditioning. Pt with no noted deficits during screening, however did have sore throat noted due to prolonged intubation.    [name] 11/08/2016, 3:06 PM

## 2016-11-08 NOTE — Consult Note (Signed)
[facility] Face-to-Face Psychiatry Consult   Reason for Consult:  Alcoholism. Referring Physician:  Dr. Posey Pronto Patient Identification: Brett Washington MRN:  [id number] Principal Diagnosis: Alcohol withdrawal seizure [facility]) Diagnosis:   Patient Active Problem List   Diagnosis Date Noted  . Alcohol use disorder, severe, dependence ([place]) [F10.20] 11/06/2016  . Aspiration pneumonia due to vomit ([place]) [J69.0]   . Alcohol withdrawal seizure ([place]) [T55.732, R56.9] 10/31/2016  . Thrombocytopenia ([place]) [D69.6] 10/31/2016  . Transaminitis [R74.0] 10/31/2016  . Hypokalemia [E87.6] 10/31/2016    Total Time spent with patient: 1 hour   11/08/2016. Follow up. Brett Washington apparently will be transferred to SNF for further treatment. He is no longer interested in residential substance abuse treatment but want to do SA IOP. He was referred to [facility]. His wife also arranged for an appointment. He is not depressed, anxious, psychotic, suicidal or homicidal. Please discharge as appropriate.  11/07/2016. Follow up. I spoke with the wife last night who is pressing for residential treatment but the patient is unable to commit. He wants to call his job first. If he still has a job, which I doubt, he is not interested I  Treatment. He denies any symptoms of depression, anxiety or psychosis. He is not suicidal or homicidal. We discussed outpatient, inpatient, and pharmacological treatment options, as well as an option to do nothing. He indicates that he would be interested in taking Antabuse.   Identifying data. Brett Washington is a 36 year old male with a history of alcoholism.  Chief complaint. "I want help."  History of present illness. Information was obtained from the patient, the chart and his wife. The patient was brought to the emergency room after witnessed episode of seizures at work this was related to alcohol withdrawal. He had delirium tremens and was transferred to ICU for treatment. Psychiatry was asked to evaluate  him for substance abuse. The patient has difficulty speaking due to sore throat from intubation. His voice is low and raspy difficult to understand. He now tells me that he wants help for alcoholism but is uncertain how to go about it. Unfortunately he already indicates that he wants to return to work as quickly as possible and is asking for a note. The patient denies any symptoms of depression, anxiety, or psychosis. He is not suicidal or homicidal. He does not use drugs other than alcohol. For the past 2 weeks he has been drinking very heavily and did not go to work.  Past psychiatric history. I spoke with his wife extensively. She reports that he has been drinking since the age of 66 socially. 4 years ago his drinking escalated resulting in him losing that job. He then started drinking even more and for the past 2 years has been drinking daily beer and cheap liquor. 2 years ago he went for alcohol treatment to someplace in [place] for a week but relapsed on alcohol almost immediately. He has never suffered mental illness, attempted suicide, was hospitalized.  Family psychiatric history. His great grandfather on paternal side died of alcoholism. He has a maternal uncle who is an alcoholic.  Social history. He lives with his wife and children. 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  Risk to Self: Is patient at risk for suicide?: No Risk to Others:   Prior Inpatient Therapy:   Prior Outpatient Therapy:    Past  Medical History:  Past Medical History:  Diagnosis Date  . Alcohol abuse   . Heart murmur   . Seizures ([place])     Past Surgical History:  Procedure Laterality Date  . HAND SURGERY     Family History:  Family History  Problem Relation Age of Onset  . Family history unknown: Yes   Social History:  History  Alcohol Use  . 33.6 oz/week  . 78 Cans of beer  per week     History  Drug Use No    Social History   Social History  . Marital status: Married    Spouse name: N/A  . Number of children: N/A  . Years of education: N/A   Social History Main Topics  . Smoking status: Never Smoker  . Smokeless tobacco: Never Used  . Alcohol use 33.6 oz/week    56 Cans of beer per week  . Drug use: No  . Sexual activity: Not Asked   Other Topics Concern  . None   Social History Narrative  . None   Additional Social History:    Allergies:  No Known Allergies  Labs:  Results for orders placed or performed during the hospital encounter of 10/31/16 (from the past 48 hour(s))  Glucose, capillary     Status: None   Collection Time: 11/06/16  4:53 PM  Result Value Ref Range   Glucose-Capillary 80 65 - 99 mg/dL  Potassium     Status: Abnormal   Collection Time: 11/06/16  6:29 PM  Result Value Ref Range   Potassium 2.9 (L) 3.5 - 5.1 mmol/L  Glucose, capillary     Status: None   Collection Time: 11/06/16  7:53 PM  Result Value Ref Range   Glucose-Capillary 81 65 - 99 mg/dL  Glucose, capillary     Status: None   Collection Time: 11/07/16 12:02 AM  Result Value Ref Range   Glucose-Capillary 85 65 - 99 mg/dL  Glucose, capillary     Status: None   Collection Time: 11/07/16  4:29 AM  Result Value Ref Range   Glucose-Capillary 98 65 - 99 mg/dL  Basic metabolic panel     Status: Abnormal   Collection Time: 11/07/16  5:19 AM  Result Value Ref Range   Sodium 141 135 - 145 mmol/L   Potassium 2.9 (L) 3.5 - 5.1 mmol/L   Chloride 106 101 - 111 mmol/L   CO2 29 22 - 32 mmol/L   Glucose, Bld 89 65 - 99 mg/dL   BUN <5 (L) 6 - 20 mg/dL   Creatinine, Ser 0.42 (L) 0.61 - 1.24 mg/dL   Calcium 8.7 (L) 8.9 - 10.3 mg/dL   GFR calc non Af Amer >60 >60 mL/min   GFR calc Af Amer >60 >60 mL/min    Comment: (NOTE) The eGFR has been calculated using the CKD EPI equation. This calculation has not been validated in all clinical situations. eGFR's  persistently <60 mL/min signify possible Chronic Kidney Disease.    Anion gap 6 5 - 15  Magnesium     Status: Abnormal   Collection Time: 11/07/16  5:19 AM  Result Value Ref Range   Magnesium 1.5 (L) 1.7 - 2.4 mg/dL  Phosphorus     Status: None   Collection Time: 11/07/16  5:19 AM  Result Value Ref Range   Phosphorus 3.7 2.5 - 4.6 mg/dL  Glucose, capillary     Status: None   Collection Time: 11/07/16  7:40 AM  Result Value Ref  Range   Glucose-Capillary 80 65 - 99 mg/dL  Glucose, capillary     Status: None   Collection Time: 11/07/16 11:38 AM  Result Value Ref Range   Glucose-Capillary 94 65 - 99 mg/dL  Glucose, capillary     Status: None   Collection Time: 11/07/16  4:45 PM  Result Value Ref Range   Glucose-Capillary 90 65 - 99 mg/dL  Magnesium     Status: None   Collection Time: 11/07/16  4:52 PM  Result Value Ref Range   Magnesium 2.0 1.7 - 2.4 mg/dL  Potassium     Status: Abnormal   Collection Time: 11/07/16  4:52 PM  Result Value Ref Range   Potassium 3.1 (L) 3.5 - 5.1 mmol/L  Glucose, capillary     Status: None   Collection Time: 11/07/16  8:23 PM  Result Value Ref Range   Glucose-Capillary 83 65 - 99 mg/dL  Glucose, capillary     Status: None   Collection Time: 11/08/16 12:21 AM  Result Value Ref Range   Glucose-Capillary 81 65 - 99 mg/dL  Potassium     Status: Abnormal   Collection Time: 11/08/16  3:33 AM  Result Value Ref Range   Potassium 3.3 (L) 3.5 - 5.1 mmol/L  Magnesium     Status: None   Collection Time: 11/08/16  3:33 AM  Result Value Ref Range   Magnesium 1.7 1.7 - 2.4 mg/dL  Glucose, capillary     Status: None   Collection Time: 11/08/16  4:09 AM  Result Value Ref Range   Glucose-Capillary 86 65 - 99 mg/dL  Glucose, capillary     Status: None   Collection Time: 11/08/16  7:53 AM  Result Value Ref Range   Glucose-Capillary 74 65 - 99 mg/dL    Current Facility-Administered Medications  Medication Dose Route Frequency Provider Last Rate Last  Dose  . amoxicillin-clavulanate (AUGMENTIN) 875-125 MG per tablet 1 tablet  1 tablet Oral Q12H Dustin Flock, MD   1 tablet at 11/08/16 0845  . chlorhexidine (PERIDEX) 0.12 % solution 15 mL  15 mL Mouth/Throat BID Laverle Hobby, MD   15 mL at 11/08/16 0845  . enoxaparin (LOVENOX) injection 40 mg  40 mg Subcutaneous Q24H Flora Lipps, MD   40 mg at 11/07/16 1547  . famotidine (PEPCID) tablet 20 mg  20 mg Oral BID Pernell Dupre, RPH   20 mg at 11/08/16 0845  . fluconazole (DIFLUCAN) tablet 150 mg  150 mg Oral Once Dustin Flock, MD      . folic acid (FOLVITE) tablet 1 mg  1 mg Oral Daily Pernell Dupre, RPH   1 mg at 11/08/16 0845  . guaiFENesin-dextromethorphan (ROBITUSSIN DM) 100-10 MG/5ML syrup 5 mL  5 mL Oral Q4H PRN Dustin Flock, MD   5 mL at 11/08/16 0130  . hydrALAZINE (APRESOLINE) injection 10 mg  10 mg Intravenous Q4H PRN Awilda Bill, NP      . ibuprofen (ADVIL,MOTRIN) tablet 400 mg  400 mg Oral Q6H PRN Lance Coon, MD   400 mg at 11/08/16 0129  . LORazepam (ATIVAN) injection 1 mg  1 mg Intravenous Q6H PRN Fritzi Mandes, MD      . magnesium oxide (MAG-OX) tablet 400 mg  400 mg Oral BID Ramond Dial, RPH   400 mg at 11/08/16 0844  . multivitamin with minerals tablet 1 tablet  1 tablet Oral Daily Pernell Dupre, RPH   1 tablet at 11/08/16 0844  . nystatin (  MYCOSTATIN) 100000 UNIT/ML suspension 500,000 Units  5 mL Mouth/Throat QID Dustin Flock, MD      . ondansetron [facility]) tablet 4 mg  4 mg Oral Q6H PRN Lance Coon, MD       Or  . ondansetron [facility]) injection 4 mg  4 mg Intravenous Q6H PRN Lance Coon, MD   4 mg at 11/06/16 1151  . phenol (CHLORASEPTIC) mouth spray 1 spray  1 spray Mouth/Throat PRN Dustin Flock, MD      . potassium chloride SA (K-DUR,KLOR-CON) CR tablet 40 mEq  40 mEq Oral BID Lenis Noon, RPH   40 mEq at 11/08/16 0844  . sodium chloride flush (NS) 0.9 % injection 10-40 mL  10-40 mL Intracatheter PRN Fritzi Mandes, MD   10 mL  at 11/06/16 0846  . thiamine (VITAMIN B-1) tablet 100 mg  100 mg Oral Daily Pernell Dupre, RPH   100 mg at 11/08/16 3016    Musculoskeletal: Strength & Muscle Tone: within normal limits Gait & Station: normal Patient leans: N/A  Psychiatric Specialty Exam: I reviewed physical examination performed by medical provider and agree with the findings. Physical Exam  Nursing note and vitals reviewed. Psychiatric: Thought content normal. His affect is blunt. His speech is slurred. He is slowed and withdrawn. Cognition and memory are normal. He expresses impulsivity.    Review of Systems  HENT: Positive for sore throat.   Neurological: Positive for seizures.  Psychiatric/Behavioral: Positive for substance abuse.  All other systems reviewed and are negative.   Blood pressure 120/70, pulse 81, temperature 98.5 F (36.9 C), temperature source Oral, resp. rate 16, height _0  (1.727 m), weight 83 kg (182 lb 14.4 oz), SpO2 95 %.Body mass index is 27.81 kg/m.  General Appearance: Fairly Groomed  Eye Contact:  Good  Speech:  Garbled  Volume:  Decreased  Mood:  Depressed  Affect:  Blunt and Congruent  Thought Process:  Goal Directed and Descriptions of Associations: Intact  Orientation:  Full (Time, Place, and Person)  Thought Content:  WDL  Suicidal Thoughts:  No  Homicidal Thoughts:  No  Memory:  Immediate;   Fair Recent;   Fair Remote;   Fair  Judgement:  Poor  Insight:  Lacking  Psychomotor Activity:  Psychomotor Retardation  Concentration:  Concentration: Fair and Attention Span: Fair  Recall:  [employer] of Knowledge:  Fair  Language:  Fair  Akathisia:  No  Handed:  Right  AIMS (if indicated):     Assets:  Communication Skills Desire for Improvement Housing Intimacy Physical Health Resilience Social Support Transportation  ADL's:  Intact  Cognition:  WNL  Sleep:        Treatment Plan Summary: Daily contact with patient to assess and evaluate symptoms and  progress in treatment and Medication management   PLAN:  1. I spoke with the wife who strongly supports residential substance abuse treatment but the patient prefers outpatient treatment, if at all.  2. The patient agrees to take Antabuse. First dose should be given before discharge.   3. He will follow up with SA IOP at  [facility].  4. I will sign off.   Disposition: No evidence of imminent risk to self or others at present.   Patient does not meet criteria for psychiatric inpatient admission. Supportive therapy provided about ongoing stressors. Refer to IOP. Discussed crisis plan, support from social network, calling 911, coming to the Emergency Department, and calling Suicide Hotline.  Orson Slick, MD 11/08/2016 12:36 PM

## 2016-11-08 NOTE — Care Management (Signed)
Both physical and occupational therapy are recommending inpatient acute rehabilitation,  Discussed this disposition with Mr. Brett Washington at the bedside. [facility] that [facility] Would like to stay local so his family can visit. Gwenette GreetBrenda S Jamar Weatherall RN MSN CCM Care Management [phone]

## 2016-11-08 NOTE — Progress Notes (Signed)
Yellow arm band in place. 

## 2016-11-08 NOTE — Evaluation (Signed)
Occupational Therapy Evaluation Patient Details Name: Brett FinerJose Burley MRN: [id number] DOB: 06/12/1980 Today's Date: 11/08/2016    History of Present Illness Pt. is a 36 y.o. male who was [facility] for seizure activity related to ETOH withdrawal. Pt. was transferred to the CCU secondary to confusion, and agitation from severe  DTs. Pt. was placed on Precedex Drip, and was intubated  on 7/22-7/24 for airway protection..   Clinical Impression   Pt. Presents with Limited LUE ROM, and functioning from a recent radial nerve injury. Pt.'s wife reports pt. [facility]. Pt.'s wife reports pt. Has had a significant weight loss, and has a poor appretite at home. Pt. Is using his right hand for self-feeding , and light grooming with set-up. Pt. Also presents with RUE weakness, limited trunk control in sitting, impaired functional mobility, and decreased activity tolerance which are limiting his ability to complete ADL, and IADL functioning. Pt. Could benefit from OT services to work towards improving ADL, and IADL functioning and overall independence. Pt. Education was provided verbally, and through visual demonstration about LUE care, positioning, and ROM. Pt. Could benefit from a radial nerve splint. Pt. Would benefit from CIR with follow-up OT services.    Follow Up Recommendations  CIR    Equipment Recommendations       Recommendations for Other Services       Precautions / Restrictions Precautions Precautions: Fall Restrictions Weight Bearing Restrictions: No             ADL either performed or assessed with clinical judgement   ADL Overall ADL's : Needs assistance/impaired Eating/Feeding: Set up (uses right hand)   Grooming: Set up   Upper Body Bathing: Moderate assistance   Lower Body Bathing: Moderate assistance   Upper Body Dressing : Moderate assistance   Lower Body Dressing: Moderate assistance                Functional mobility during ADLs: Maximal assistance       Vision Patient Visual Report: No change from baseline       Perception     Praxis      Pertinent Vitals/Pain Pain Assessment: 0-10     Hand Dominance Right   Extremity/Trunk Assessment Upper Extremity Assessment Upper Extremity Assessment: RUE deficits/detail;LUE deficits/detail RUE Deficits / Details: Shoulder flexion, abduction 3-/5, elbow flexion extension, wrist flexion/extension 4-/5.  LUE Deficits / Details: Pt. has a radial nerve injury  approximately 2 months ago per wife. Shoulder flexion, abduction, elbow flexion, extension 3-/5, wrist extension 1/5. Impaired thumb abd digit movements.           Communication Communication Communication: No difficulties   Cognition Arousal/Alertness: Awake/alert Behavior During Therapy: WFL for tasks assessed/performed Overall Cognitive Status: Within Functional Limits for tasks assessed                                     General Comments       Exercises     Shoulder Instructions      Home Living Family/patient expects to be discharged to:: Private residence Living Arrangements: Spouse/significant other Available Help at Discharge: Family Type of Home: House       Home Layout: Two level;Able to live on main level with bedroom/bathroom     Bathroom Shower/Tub: Walk-in shower;Tub/shower unit         Home Equipment:  (Pt. wife will  be getting a mattress for pt. 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 Pt. has not driven in 1-2 years.        OT Problem List: Decreased strength;Decreased range of motion;Decreased activity tolerance;Decreased coordination;Impaired UE functional use;Decreased knowledge of use of DME or AE      OT Treatment/Interventions: Self-care/ADL  training;Therapeutic exercise;DME and/or AE instruction;Patient/family education;Therapeutic activities;Energy conservation    OT Goals(Current goals can be found in the care plan section)    OT Frequency: Min 2X/week   Barriers to D/C:            Co-evaluation              AM-PAC PT "6 Clicks" Daily Activity     Outcome Measure Help from another person eating meals?: A Little Help from another person taking care of personal grooming?: A Little Help from another person toileting, which includes using toliet, bedpan, or urinal?: A Lot Help from another person bathing (including washing, rinsing, drying)?: A Lot Help from another person to put on and taking off regular upper body clothing?: A Lot Help from another person to put on and taking off regular lower body clothing?: A Lot 6 Click Score: 14   End of Session    Activity Tolerance: Patient tolerated treatment well Patient left: in bed  OT Visit Diagnosis: Muscle weakness (generalized) (M62.81);History of falling (Z91.81)                Time: 1000-1030 OT Time Calculation (min): 30 min Charges:  OT General Charges $OT Visit: 1 Procedure OT Evaluation $OT Eval Moderate Complexity: 1 Procedure G-Codes:     Olegario MessierElaine Tamalyn Wadsworth, MS, OTR/L   Olegario MessierElaine Savion Washam, MS, OTR/L 11/08/2016, 11:55 AM

## 2016-11-09 LAB — BASIC METABOLIC PANEL
Anion gap: 3 — ABNORMAL LOW (ref 5–15)
BUN: 6 mg/dL (ref 6–20)
CO2: 27 mmol/L (ref 22–32)
CREATININE: 0.51 mg/dL — AB (ref 0.61–1.24)
Calcium: 8.6 mg/dL — ABNORMAL LOW (ref 8.9–10.3)
Chloride: 108 mmol/L (ref 101–111)
GFR calc Af Amer: >60 mL/min (ref >60)
GLUCOSE: 92 mg/dL (ref 65–99)
POTASSIUM: 3.7 mmol/L (ref 3.5–5.1)
Sodium: 138 mmol/L (ref 135–145)

## 2016-11-09 LAB — TRIGLYCERIDES: Triglycerides: 270 mg/dL — ABNORMAL HIGH (ref <150)

## 2016-11-09 LAB — MAGNESIUM: MAGNESIUM: 1.7 mg/dL (ref 1.7–2.4)

## 2016-11-09 MED ORDER — MAGNESIUM SULFATE 2 GM/50ML IV SOLN
2.0000 g | Freq: Once | INTRAVENOUS | Status: AC
  Administered 2016-11-09: 09:00:00 2 g via INTRAVENOUS
  Filled 2016-11-09: qty 50

## 2016-11-09 NOTE — Progress Notes (Signed)
[facility] -  at [facility]   PATIENT NAME: Brett Washington    MR#:  [id number]  DATE OF BIRTH:  01/28/1981  SUBJECTIVE:  Confusion improved   REVIEW OF SYSTEMS:   Review of Systems  Unable to perform ROS: Mental acuity    DRUG ALLERGIES:  No Known Allergies  VITALS:  Blood pressure 127/71, pulse 89, temperature 97.6 F (36.4 C), resp. rate 18, height 5\' 8"  (1.727 m), weight 180 lb 7 oz (81.8 kg), SpO2 97 %.  PHYSICAL EXAMINATION:   Physical Exam  GENERAL:  36 y.o.-year-old patient lying in the bed with no acute distress. Obese, critically ill EYES: Pupils equal, round, reactive to light and accommodation. No scleral icterus. Extraocular muscles intact.  HEENT: Head atraumatic, normocephalic. Oropharynx and nasopharynx clear.  NECK:  Supple, no jugular venous distention. No thyroid enlargement, no tenderness.  LUNGS: Normal breath sounds bilaterally, no wheezing, rales, rhonchi. No use of accessory muscles of respiration. tachycardia CARDIOVASCULAR: S1, S2 normal. No murmurs, rubs, or gallops.  ABDOMEN: Soft, nontender, nondistended. Bowel sounds present. No organomegaly or mass. Rectal tube EXTREMITIES: No cyanosis, clubbing or edema b/l.    NEUROLOGIC: non focal PSYCHIATRIC:awake and alert SKIN: No obvious rash, lesion, or ulcer.   LABORATORY PANEL:  CBC  Recent Labs Lab 11/06/16 0516  WBC 4.8  HGB 12.6*  HCT 36.7*  PLT 124*    Chemistries   Recent Labs Lab 11/05/16 0444  11/09/16 0424  NA 153*  < > 138  K 3.4*  < > 3.7  CL 127*  < > 108  CO2 20*  < > 27  GLUCOSE 125*  < > 92  BUN 21*  < > 6  CREATININE 1.00  < > 0.51*  CALCIUM 8.6*  < > 8.6*  MG 2.2  < > 1.7  AST 208*  --   --   ALT 103*  --   --   ALKPHOS 127*  --   --   BILITOT 8.3*  --   --   < > = values in this interval not displayed. Cardiac Enzymes No results for input(s): TROPONINI in the last 168 hours. RADIOLOGY:  No results found. ASSESSMENT AND  PLAN:  Brett Washington  is a 36 y.o. male who presents with Seizure episode. Patient was working when he had his seizure. He states that he has had a seizure like this before due to alcohol withdrawal. He states that he drinks about 8 years daily. His alcohol level in the ED was only 6.   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 Management plans discussed with the patient, family and they are in agreement.  CODE STATUS: FULL  DVT Prophylaxis: lovenox  TOTAL TIME TAKING CARE OF THIS PATIENT: 30 minutes.  >50% time spent on counselling and coordination of care  POSSIBLE D/C IN 1-2 DAYS, DEPENDING ON CLINICAL CONDITION.  Note: This dictation was prepared with Dragon dictation along with smaller phrase technology.  Any transcriptional errors that result from this process are unintentional.  Auburn BilberryPATEL, Emilio Baylock M.D on 11/09/2016 at 1:37 PM  Between 7am to 6pm - Pager - [phone]  After 6pm go to [email or web address] - Social research officer, governmentpassword EPAS [facility]  [facility]  Office  [phone]  CC: Primary care physician; [facility]

## 2016-11-09 NOTE — Progress Notes (Signed)
Pt has on yellow (fall) armband 

## 2016-11-09 NOTE — Progress Notes (Signed)
[facility] [place] [facility] [place] [facility]   PATIENT NAME: Brett FinerJose Washington    MR#:  [id number]  DATE OF BIRTH:  08/22/1980  SUBJECTIVE:  Patient still very weak but slowly improving Cough improved  REVIEW OF SYSTEMS:   Review of Systems  Constitutional: Negative for chills, fever and weight loss.  HENT: Negative for ear discharge, ear pain, hearing loss, nosebleeds and tinnitus.   Eyes: Negative for blurred vision, double vision and photophobia.  Respiratory: Positive for cough.   Cardiovascular: Negative for chest pain, palpitations, orthopnea and claudication.  Gastrointestinal: Negative for abdominal pain, diarrhea, heartburn, nausea and vomiting.  Genitourinary: Negative for dysuria, frequency, hematuria and urgency.  Musculoskeletal: Negative for back pain, myalgias and neck pain.  Skin: Negative for itching and rash.  Neurological: Negative for dizziness, tingling and headaches.  Endo/Heme/Allergies: Negative for environmental allergies. Does not bruise/bleed easily.  Psychiatric/Behavioral: Negative for depression and suicidal ideas. Memory loss: menatal staus back to baseling.    DRUG ALLERGIES:  No Known Allergies  VITALS:  Blood pressure 127/71, pulse 89, temperature 97.6 F (36.4 C), resp. rate 18, height 5\' 8"  (1.727 m), weight 180 lb 7 oz (81.8 kg), SpO2 97 %.  PHYSICAL EXAMINATION:   Physical Exam  GENERAL:  36 y.o.-year-old patient lying in the bed with no acute distress. Obese, critically ill EYES: Pupils equal, round, reactive to light and accommodation. No scleral icterus. Extraocular muscles intact.  HEENT: Head atraumatic, normocephalic. Oropharynx and nasopharynx clear.  NECK:  Supple, no jugular venous distention. No thyroid enlargement, no tenderness.  LUNGS: Normal breath sounds bilaterally, no wheezing, rales, rhonchi. No use of accessory muscles of respiration. tachycardia CARDIOVASCULAR: S1, S2 normal. No murmurs, rubs, or  gallops.  ABDOMEN: Soft, nontender, nondistended. Bowel sounds present. No organomegaly or mass. Rectal tube EXTREMITIES: No cyanosis, clubbing or edema b/l.    NEUROLOGIC:  Patient has difficulty with moving his left upper extremity PSYCHIATRIC:awake and alert SKIN: No obvious rash, lesion, or ulcer.   LABORATORY PANEL:  CBC  Recent Labs Lab 11/06/16 0516  WBC 4.8  HGB 12.6*  HCT 36.7*  PLT 124*    Chemistries   Recent Labs Lab 11/05/16 0444  11/09/16 0424  NA 153*  < > 138  K 3.4*  < > 3.7  CL 127*  < > 108  CO2 20*  < > 27  GLUCOSE 125*  < > 92  BUN 21*  < > 6  CREATININE 1.00  < > 0.51*  CALCIUM 8.6*  < > 8.6*  MG 2.2  < > 1.7  AST 208*  --   --   ALT 103*  --   --   ALKPHOS 127*  --   --   BILITOT 8.3*  --   --   < > = values in this interval not displayed. Cardiac Enzymes No results for input(s): TROPONINI in the last 168 hours. RADIOLOGY:  No results found. ASSESSMENT AND PLAN:  Brett FinerJose Washington  is a 36 y.o. male who presents with Seizure episode. Patient was working when he had his seizure. He states that he has had a seizure like this before due to alcohol withdrawal. He states that he drinks about 8 years daily. His alcohol level in the ED was only 6.   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 Management plans discussed with the patient, family and they are in agreement.  CODE STATUS: FULL  DVT Prophylaxis: lovenox  TOTAL TIME TAKING CARE OF THIS PATIENT: 30 minutes.  >50% time spent on counselling and coordination of care  POSSIBLE D/C IN 1-2 DAYS, DEPENDING ON CLINICAL CONDITION.  Note: This dictation was prepared with Dragon dictation along with smaller phrase technology. Any transcriptional errors that result from this process are unintentional.  Brett BilberryPATEL, Brett Washington M.D on 11/09/2016 at 1:48 PM  Between 7am to 6pm - Pager - [phone]  After 6pm go to [email or web address] - Social research officer, governmentpassword EPAS [facility]  [facility]  Office  [phone]  CC: Primary care physician; [facility]

## 2016-11-09 NOTE — Progress Notes (Signed)
SLP Cancellation Note  Patient Details Name: Brett Washington Arrasmith MRN: [id number] DOB: 07/30/1980   Cancelled treatment:       Reason Eval/Treat Not Completed: SLP screened, no needs identified, will sign off  No further evaluation or intervention required.    [name] 11/09/2016, 12:19 PM

## 2016-11-09 NOTE — Progress Notes (Signed)
Pharmacy Consult for Electrolyte Monitoring  Pharmacy consulted for electrolyte management for 36 yo male requiring mechanical intubation secondary to alcohol withdrawal and seizures.  Pt is now extubated and transferred to the floor. Pt was on lactulose for elevated ammonia. No longer on lactulose and amount of BM appears to have decreased. Pt has a rectal tube.  Mg goal >2 in the setting of seizure   Plan:  Mg =1.7 K=3.7 Give Mg IV 2 g x 1 Will continue Mag ox 400mg  BID for a goal Mg >2 Continue KCL 40 MEQ po BID Recheck in the AM   No Known Allergies  Patient Measurements: Height: 5\' 8"  (172.7 cm) Weight: 180 lb 7 oz (81.8 kg) IBW/kg (Calculated) : 68.4  Vital Signs: Temp: 98.4 F (36.9 C) (07/28 0419) Temp Source: Oral (07/28 0419) BP: 120/68 (07/28 0419) Pulse Rate: 70 (07/28 0419) Intake/Output from previous day: 07/27 0701 - 07/28 0700 In: 290 [P.O.:240; IV Piggyback:50] Out: 1425 [Urine:1425] Intake/Output from this shift: No intake/output data recorded.  Labs:  Recent Labs  11/07/16 0519 11/07/16 1652 11/08/16 0333 11/09/16 0424  CREATININE 0.42*  --   --  0.51*  MG 1.5* 2.0 1.7 1.7  PHOS 3.7  --   --   --    Estimated Creatinine Clearance: 125.9 mL/min (A) (by C-G formula based on SCr of 0.51 mg/dL (L)).  Potassium (mmol/L)  Date Value  11/09/2016 3.7  11/11/2013 3.5   Sodium (mmol/L)  Date Value  11/09/2016 138  11/11/2013 145   Calcium (mg/dL)  Date Value  16/10/960407/28/2018 8.6 (L)   Calcium, Total (mg/dL)  Date Value  54/09/811907/30/2015 8.2 (L)    Pharmacy will continue to monitor and adjust per consult.   Demetrius CharityJames,Lekesha Claw D, PharmD, BCPS Clinical Pharmacist 11/09/2016 8:09 AM

## 2016-11-09 NOTE — Progress Notes (Signed)
Occupational Therapy Treatment Patient Details Name: Brett Washington MRN: [id number] DOB: 01/24/1981 Today's Date: 11/09/2016    History of present illness P presented to ER and admitted for acute hospitalization status post seizure activity related to ETOH withdrawal. Hospital course complicated by confusion and agitation related to severe DTs, requiring transfer to CCU for precedex drip and subsequent intubation (7/22-7/24) for airway protection.  Currently weaned to 2L supplemental O2, alert, oriented and cooperative with care.  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  Per Dr. Rosita KeaMenz, no WBing/ROM restrictions, brace no longer required at this time.   OT comments  Pt progressing towards goals. Pt very eager to participate in therapy. Became tearful at one point talking about the impact of his drinking on him and his family. OT provided emotional support/encouragement and pt pleased with his progress thus far and very motivated to participate in therapy to maximize his return to PLOF in order to be there for his family. Pt educated in LUE exercises to support extensor muscles throughout LUE and pt able to demonstrate with fair quality of movement due to wrist drop from radial nerve palsy. Pt reports he has been able to utilize his LUE somewhat in bilateral tasks, using his R hand to position and open his L hand when needed. Pt performed grooming tasks at the sink with close supervision and UE support on counter, performed toileting with close supervision and no LOB noted for the toilet transfer. Also ambulated briefly with RW and min assist for slight LOB when turning and verbal cues for safety, as pt is mildly impulsive at times. Pt continues to benefit from skilled OT Services.    Follow Up Recommendations  CIR    Equipment Recommendations        Recommendations for Other Services      Precautions / Restrictions Precautions Precautions: Fall Restrictions Weight Bearing Restrictions: No       Mobility Bed Mobility Overal bed mobility: Modified Independent Bed Mobility: Supine to Sit;Sit to Supine     Supine to sit: Modified independent (Device/Increase time) Sit to supine: Modified independent (Device/Increase time)   General bed mobility comments: minimal use of bedrails to assist   Transfers Overall transfer level: Needs assistance   Transfers: Sit to/from Stand Sit to Stand: Min guard         General transfer comment: VC for hand placement to push up from bed    Balance Overall balance assessment: Needs assistance Sitting-balance support: No upper extremity supported;Feet supported Sitting balance-Leahy Scale: Good Sitting balance - Comments: sitting reaching within BOS   Standing balance support: During functional activity;Single extremity supported;No upper extremity supported Standing balance-Leahy Scale: Fair Standing balance comment: washed hands at sink with pelvis resting against sink for support, brushed teeth with LUE resting on sink counter for support            ADL either performed or assessed with clinical judgement   ADL Overall ADL's : Needs assistance/impaired Eating/Feeding: Set up   Grooming: Supervision/safety;Standing;Wash/dry face;Wash/dry hands;Oral care Grooming Details (indicate cue type and reason): pt stood at sink within RW with LUE supported on counter to brush his teeth and wash his face, stood with no UE support to wash hands while reaching outside his BOS for soap and for a paper towel with no LOB noted.  Toilet Transfer: Supervision/safety;Ambulation;Regular Teacher, adult educationToilet Toilet Transfer Details (indicate cue type and reason): pt able to perform toilet transfer with supervision and no AD, no LOB noted Toileting- Clothing Manipulation and Hygiene:  Independent;Sitting/lateral lean;Sit to/from stand Toileting - Clothing Manipulation Details (indicate cue type and reason): pt performed clothing mgt and hygiene independently with no LOB or difficulty using RUE     Functional mobility during ADLs: Min guard;Rolling walker General ADL Comments: Slightly impulsive at times with mobility, min assist to correct for slight LOB turning turning while ambulating with RW     Vision Baseline Vision/History: No visual deficits Patient Visual Report: No change from baseline     Perception     Praxis      Cognition Arousal/Alertness: Awake/alert Behavior During Therapy: WFL for tasks assessed/performed (eager, mildly impulsive) Overall Cognitive Status: Within Functional Limits for tasks assessed                                          Exercises Other Exercises Other Exercises: pt educated in seated HEP for LUE strengthening flex/ext, with pt demonstrating understanding, eager to participate Other Exercises: pt ambulated briefly in hallway with RW, requiring min assist for slight LOB while turning and VC for RW safety/balance   Shoulder Instructions       General Comments     Pertinent Vitals/ Pain       Pain Assessment: No/denies pain Pain Intervention(s): Monitored during session  Home Living                                          Prior Functioning/Environment              Frequency  Min 2X/week        Progress Toward Goals  OT Goals(current goals can now be found in the care plan section)  Progress towards OT goals: Progressing toward goals  Acute Rehab OT Goals Patient Stated Goal: to get stronger  Plan Discharge plan remains appropriate    Co-evaluation                 AM-PAC PT "6 Clicks" Daily Activity     Outcome Measure   Help from another person eating meals?: A Little Help from another person taking care of personal grooming?: A Little Help from  another person toileting, which includes using toliet, bedpan, or urinal?: A Little Help from another person bathing (including washing, rinsing, drying)?: A Lot Help from another person to put on and taking off regular upper body clothing?: A Lot Help from another person to put on and taking off regular lower body clothing?: A Lot 6 Click Score: 15    End of Session Equipment Utilized During Treatment: Gait belt;Rolling walker  OT Visit Diagnosis: Muscle weakness (generalized) (M62.81);History of falling (Z91.81)   Activity Tolerance Patient tolerated treatment well   Patient Left in bed;with call bell/phone within reach;with bed alarm set   Nurse Communication          Time: [id number] OT Time Calculation (min): 32 min  Charges: OT General Charges $OT Visit: 1 Procedure OT Treatments $Self Care/Home Management : 23-37 mins  Richrd PrimeJamie Stiller, MPH, MS, OTR/L ascom [phone] 11/09/16, 5:37 PM

## 2016-11-09 NOTE — Clinical Social Work Note (Signed)
CSW aware patient needs SAIOP resources as recommended by psychiatry. The CSW will assess when able. PT and OT recommend CIR, and the RNCM is following for such. CSW will assess for SNF placement should CIR not be available once the patient is medically stable and no longer CIWA.  Brett PonderKaren Martha Nasier Thumm, MSW, Theresia MajorsLCSWA [phone]

## 2016-11-09 NOTE — Progress Notes (Signed)
[facility] [place] [facility]   PATIENT NAME: Brett Washington    MR#:  [id number]  DATE OF BIRTH:  09/06/1980  SUBJECTIVE:  Doing better states that he has a sore throat  REVIEW OF SYSTEMS:   Review of Systems  Constitutional: Negative for chills, fever and weight loss.  HENT: Negative for ear discharge, ear pain, hearing loss, nosebleeds and tinnitus.   Eyes: Negative for blurred vision, double vision and photophobia.  Respiratory: Positive for cough.   Cardiovascular: Negative for chest pain, palpitations, orthopnea and claudication.  Gastrointestinal: Negative for abdominal pain, diarrhea, heartburn, nausea and vomiting.  Genitourinary: Negative for dysuria, frequency, hematuria and urgency.  Musculoskeletal: Negative for back pain, myalgias and neck pain.  Skin: Negative for itching and rash.  Neurological: Negative for dizziness, tingling and headaches.  Endo/Heme/Allergies: Negative for environmental allergies. Does not bruise/bleed easily.  Psychiatric/Behavioral: Negative for depression and suicidal ideas. Memory loss: menatal staus back to baseling.    DRUG ALLERGIES:  No Known Allergies  VITALS:  Blood pressure 127/71, pulse 89, temperature 97.6 F (36.4 C), resp. rate 18, height 5\' 8"  (1.727 m), weight 180 lb 7 oz (81.8 kg), SpO2 97 %.  PHYSICAL EXAMINATION:   Physical Exam  GENERAL:  36 y.o.-year-old patient lying in the bed with no acute distress. Obese, critically ill EYES: Pupils equal, round, reactive to light and accommodation. No scleral icterus. Extraocular muscles intact.  HEENT: Head atraumatic, normocephalic. Oropharynx and nasopharynx clear.  NECK:  Supple, no jugular venous distention. No thyroid enlargement, no tenderness.  LUNGS: Normal breath sounds bilaterally, no wheezing, rales, rhonchi. No use of accessory muscles of respiration. tachycardia CARDIOVASCULAR: S1, S2 normal. No murmurs, rubs, or gallops.  ABDOMEN:  Soft, nontender, nondistended. Bowel sounds present. No organomegaly or mass. Rectal tube EXTREMITIES: No cyanosis, clubbing or edema b/l.    NEUROLOGIC:  Patient has difficulty with moving his left upper extremity PSYCHIATRIC:awake and alert SKIN: No obvious rash, lesion, or ulcer.   LABORATORY PANEL:  CBC  Recent Labs Lab 11/06/16 0516  WBC 4.8  HGB 12.6*  HCT 36.7*  PLT 124*    Chemistries   Recent Labs Lab 11/05/16 0444  11/09/16 0424  NA 153*  < > 138  K 3.4*  < > 3.7  CL 127*  < > 108  CO2 20*  < > 27  GLUCOSE 125*  < > 92  BUN 21*  < > 6  CREATININE 1.00  < > 0.51*  CALCIUM 8.6*  < > 8.6*  MG 2.2  < > 1.7  AST 208*  --   --   ALT 103*  --   --   ALKPHOS 127*  --   --   BILITOT 8.3*  --   --   < > = values in this interval not displayed. Cardiac Enzymes No results for input(s): TROPONINI in the last 168 hours. RADIOLOGY:  No results found. ASSESSMENT AND PLAN:  Brett FinerJose Volpi  is a 36 y.o. male who presents with Seizure episode. Patient was working when he had his seizure. He states that he has had a seizure like this before due to alcohol withdrawal. He states that he drinks about 8 years daily. His alcohol level in the ED was only 6.   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 Management plans discussed with the patient, family and they are in agreement.  CODE STATUS: FULL  DVT Prophylaxis: lovenox  TOTAL TIME TAKING CARE OF THIS PATIENT: 30 minutes.  >50% time spent on counselling and coordination of care  POSSIBLE D/C IN 1-2 DAYS, DEPENDING ON CLINICAL CONDITION.  Note: This dictation was prepared with Dragon dictation along with smaller phrase technology. Any transcriptional errors that result from this process are unintentional.  Auburn BilberryPATEL, Glynis Hunsucker M.D on 11/09/2016 at 1:41 PM  Between 7am to 6pm - Pager - [phone]  After 6pm go to [email or web address] - Social research officer, governmentpassword EPAS [facility]  [facility] [place] [facility]  Office  [phone]  CC: Primary care physician; [facility]

## 2016-11-09 NOTE — Progress Notes (Signed)
Physical Therapy Treatment Patient Details Name: Brett FinerJose Washington MRN: [id number] DOB: 08/03/1980 Today's Date: 11/09/2016    History of Present Illness P presented to ER and admitted for acute hospitalization status post seizure activity related to ETOH withdrawal. Hospital course complicated by confusion and agitation related to severe DTs, requiring transfer to CCU for precedex drip and subsequent intubation (7/22-7/24) for airway protection.  Currently weaned to 2L supplemental O2, alert, oriented and cooperative with care.  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  Per Dr. Rosita KeaMenz, no WBing/ROM restrictions, brace no longer required at this time.    PT Comments    Pt continues to demonstrate great motivation to participate in physical therapy although mild impulsiveness noted.  Balance impairments continue to be noted with ambulation (especially with any dynamic activity including head turns); pt also with 1 sudden loss of balance with ambulation requiring min to mod assist to regain balance.  Single leg stance time 0 seconds B; Single limb (LE) stance activities require increased assist for safety d/t unsteadiness (ankle strategies noted with activities).  See below for further balance activities/assist levels.  Will continue to focus on balance to improve safety with functional mobility.    Follow Up Recommendations  CIR     Equipment Recommendations       Recommendations for Other Services       Precautions / Restrictions Precautions Precautions: Fall Restrictions Weight Bearing Restrictions: No    Mobility  Bed Mobility Overal bed mobility: Modified Independent Bed Mobility: Supine to Sit;Sit to Supine     Supine to sit: Modified independent (Device/Increase time) Sit to supine: Modified independent (Device/Increase time)       Transfers Overall transfer level: Needs assistance   Transfers: Sit to/from Stand Sit to Stand: Min guard         General transfer comment: strong stand; B LE's pushing against bed with standing for support  Ambulation/Gait Ambulation/Gait assistance: Min guard;Min assist;Mod assist Ambulation Distance (Feet): 100 Feet Assistive device: None       General Gait Details: Increased BOS; inconsistent step height/length; overall "shaky" with decreased B heel strike; 1 loss of balance to R requiring min to mod assist to correct; CGA to min assist to steady when performing head turns/changes of direction.   Stairs            Wheelchair Mobility    Modified Rankin (Stroke Patients Only)       Balance Overall balance assessment: Needs assistance Sitting-balance support: No upper extremity supported;Feet supported Sitting balance-Leahy Scale: Good Sitting balance - Comments: sitting reaching within BOS   Standing balance support: No upper extremity supported Standing balance-Leahy Scale: Fair Standing balance comment: static standing with normal BOS Single Leg Stance - Right Leg: 0 Single Leg Stance - Left Leg: 0 Tandem Stance - Right Leg: 4 Tandem Stance - Left Leg: 0 (min to mod assist to maintain balance for 10 seconds)     High level balance activites: Side stepping;Backward walking;Head turns;Turns;Direction changes High Level Balance Comments: CGA to min assist with side stepping x10 feet L and then R (pt initially with posterior lean requiring assist for balance); CGA to min assist ambulating backwards 10 feet x3 (occasional posterior loss of balance); pt veering to L and unsteady requiring min assist to steady with ambulating looking to L; pt mildly veering to R and unsteady  requiring min assist to steady with ambulating looking to R; mod assist to walk 5 feet forward tandem on line on floor            Cognition Arousal/Alertness: Awake/alert Behavior  [facility]: [facility] for tasks assessed/performed (eager; mildly impulsive) Overall Cognitive Status: Within Functional Limits for tasks assessed                                        Exercises General Exercises - Lower Extremity Hip ABduction/ADduction: AROM;Strengthening;Both;10 reps;Standing (B UE support on counter for balance)    General Comments General comments (skin integrity, edema, etc.): Pt's sister present end of session.  Nursing cleared pt for participation in physical therapy.  Pt agreeable to PT session.      Pertinent Vitals/Pain Pain Assessment: No/denies pain  Vitals (HR and O2 on room air) stable and WFL throughout treatment session.    Home Living                      Prior Function            PT Goals (current goals can now be found in the care plan section) Acute Rehab PT Goals Patient Stated Goal: to get stronger PT Goal Formulation: With patient Time For Goal Achievement: 11/21/16 Potential to Achieve Goals: Good Progress towards PT goals: Progressing toward goals    Frequency    7X/week      PT Plan Current plan remains appropriate    Co-evaluation              AM-PAC PT "6 Clicks" Daily Activity  Outcome Measure  Difficulty turning over in bed (including adjusting bedclothes, sheets and blankets)?: None Difficulty moving from lying on back to sitting on the side of the bed? : None Difficulty sitting down on and standing up from a chair with arms (e.g., wheelchair, bedside commode, etc,.)?: Total Help needed moving to and from a bed to chair (including a wheelchair)?: A Little Help needed walking in hospital room?: A Little Help needed climbing 3-5 steps with a railing? : A Lot 6 Click Score: 17    End of Session Equipment Utilized During Treatment: Gait belt Activity Tolerance: Patient tolerated treatment well Patient left: in bed;with call bell/phone within reach;with bed alarm set;with family/visitor  present Nurse Communication: Mobility status;Precautions PT Visit Diagnosis: Difficulty in walking, not elsewhere classified (R26.2);Muscle weakness (generalized) (M62.81)     Time: [id number] PT Time Calculation (min) (ACUTE ONLY): 25 min  Charges:  $Neuromuscular Re-education: 23-37 mins                    G CodesHendricks Limes:       Shed Nixon, PT 11/09/16, 4:41 PM [phone]

## 2016-11-10 LAB — BASIC METABOLIC PANEL
Anion gap: 4 — ABNORMAL LOW (ref 5–15)
BUN: 8 mg/dL (ref 6–20)
CHLORIDE: 106 mmol/L (ref 101–111)
CO2: 24 mmol/L (ref 22–32)
CREATININE: 0.49 mg/dL — AB (ref 0.61–1.24)
Calcium: 8.5 mg/dL — ABNORMAL LOW (ref 8.9–10.3)
GFR calc Af Amer: >60 mL/min (ref >60)
GFR calc non Af Amer: >60 mL/min (ref >60)
GLUCOSE: 91 mg/dL (ref 65–99)
POTASSIUM: 3.9 mmol/L (ref 3.5–5.1)
Sodium: 134 mmol/L — ABNORMAL LOW (ref 135–145)

## 2016-11-10 LAB — MAGNESIUM: Magnesium: 1.8 mg/dL (ref 1.7–2.4)

## 2016-11-10 MED ORDER — MAGNESIUM SULFATE 2 GM/50ML IV SOLN
2.0000 g | Freq: Once | INTRAVENOUS | Status: AC
  Administered 2016-11-10: 10:00:00 2 g via INTRAVENOUS
  Filled 2016-11-10: qty 50

## 2016-11-10 NOTE — Progress Notes (Signed)
Pt has on yellow (fall) armband 

## 2016-11-10 NOTE — Progress Notes (Signed)
Physical Therapy Treatment Patient Details Name: Brett FinerJose Strothman MRN: [id number] DOB: 08/21/1980 Today's Date: 11/10/2016    History of Present Illness Pt presented to ER and admitted for acute hospitalization status post seizure activity related to ETOH withdrawal. Hospital course complicated by confusion and agitation related to severe DTs, requiring transfer to CCU for precedex drip and subsequent intubation (7/22-7/24) for airway protection.  Currently weaned to 2L supplemental O2, alert, oriented and cooperative with care.  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  Per Dr. Rosita KeaMenz, no WBing/ROM restrictions, brace no longer required at this time.    PT Comments    Pt continues to demonstrate improvement with balance with functional mobility.  Pt initially able to perform SLS for 2 seconds B LE's beginning of session but by end of session pt unable to hold SLS for any amount of time B.  Pt's balance deficits are most notable with single leg stance or narrow BOS activities in general.  Balance impairments increase with pt fatigue.  Performed standing LE ex's and pt requiring single to B UE support for balance and pt reporting B quadriceps becoming sore quickly with ex's (pt requesting sitting rest break d/t soreness).  Pt would benefit from inpatient rehab to focus on strengthening, balance (especially with fatigue), and functional activities to promote safe ability to return home and go back to working.      Follow Up Recommendations  CIR     Equipment Recommendations       Recommendations for Other Services       Precautions / Restrictions Precautions Precautions: Fall Restrictions Weight Bearing Restrictions: No    Mobility  Bed Mobility Overal bed mobility: Modified Independent Bed Mobility: Supine to Sit;Sit to Supine     Supine to sit:  Modified independent (Device/Increase time) Sit to supine: Modified independent (Device/Increase time)   General bed mobility comments: no difficulties noted; HOB mildly elevated  Transfers Overall transfer level: Needs assistance Equipment used: None Transfers: Sit to/from Stand Sit to Stand: Supervision         General transfer comment: pt using B UE's on thighs to stand up  Ambulation/Gait Ambulation/Gait assistance: Min guard Ambulation Distance (Feet): 100 Feet Assistive device: None       General Gait Details: increased BOS; mildly "shaky" with decreased B heel strike   Stairs            Wheelchair Mobility    Modified Rankin (Stroke Patients Only)       Balance Overall balance assessment: Needs assistance Sitting-balance support: No upper extremity supported;Feet supported Sitting balance-Leahy Scale: Good Sitting balance - Comments: sitting reaching outside BOS   Standing balance support: No upper extremity supported Standing balance-Leahy Scale: Fair Standing balance comment: pt leaning against sink with pelvis or single UE support when brushing teeth or washing hands Single Leg Stance - Right Leg:  (0-2 seconds) Single Leg Stance - Left Leg:  (0-2 seconds) Tandem Stance - Right Leg:  (Min assist x10 seconds) Tandem Stance - Left Leg:  (CGA to min assist x10 seconds)     High level balance activites: Side stepping;Backward walking;Turns;Head turns High Level Balance Comments: CGA sidestepping to R and L x20 feet each (pt "shaky" with movements); CGA ambulating backwards x20 feet (cueing to focus on object for straight path); pt able to look L and R without any significant  veering but overall appearing "shaky"; pt requiring mod assist to walk tandem on line x10 feet            Cognition Arousal/Alertness: Awake/alert Behavior During Therapy:  (eager; mildly impulsive) Overall Cognitive Status: Within Functional Limits for tasks assessed                                         Exercises Total Joint Exercises Knee Flexion: AROM;Strengthening;Both;10 reps;Standing (vc's for technique; single UE support required for balance) General Exercises - Lower Extremity Hip ABduction/ADduction: AROM;Strengthening;Both;10 reps;Standing (vc's and tactile cues for correct technique; B UE support for balance) Hip Flexion/Marching: AROM;Strengthening;Both;10 reps;Standing (single UE support required for balance)    General Comments General comments (skin integrity, edema, etc.): Pt's wife present beginning of session but left during session.  Nursing cleared pt for participation in physical therapy.  Pt agreeable to PT session.      Pertinent Vitals/Pain Pain Assessment: No/denies pain  Vitals stable and WFL throughout treatment session.    Home Living                      Prior Function            PT Goals (current goals can now be found in the care plan section) Acute Rehab PT Goals Patient Stated Goal: to get stronger PT Goal Formulation: With patient Time For Goal Achievement: 11/21/16 Potential to Achieve Goals: Good Progress towards PT goals: Progressing toward goals    Frequency    7X/week      PT Plan Current plan remains appropriate    Co-evaluation              AM-PAC PT "6 Clicks" Daily Activity  Outcome Measure  Difficulty turning over in bed (including adjusting bedclothes, sheets and blankets)?: None Difficulty moving from lying on back to sitting on the side of the bed? : None Difficulty sitting down on and standing up from a chair with arms (e.g., wheelchair, bedside commode, etc,.)?: A Little Help needed moving to and from a bed to chair (including a wheelchair)?: A Little Help needed walking in hospital room?: A Little Help needed climbing 3-5 steps with a railing? : A Lot 6 Click Score: 19    End of Session Equipment Utilized During Treatment: Gait belt Activity  Tolerance: Patient tolerated treatment well Patient left: in chair;with call bell/phone within reach;with chair alarm set Nurse Communication: Mobility status;Precautions PT Visit Diagnosis: Difficulty in walking, not elsewhere classified (R26.2);Muscle weakness (generalized) (M62.81)     Time: [id number] PT Time Calculation (min) (ACUTE ONLY): 29 min  Charges:  $Neuromuscular Re-education: 23-37 mins                    G CodesHendricks Limes:       Cordel Drewes, PT 11/10/16, 5:11 PM [phone]

## 2016-11-10 NOTE — Progress Notes (Signed)
Yellow arm band in place. 

## 2016-11-10 NOTE — Progress Notes (Signed)
Pharmacy Consult for Electrolyte Monitoring  Pharmacy consulted for electrolyte management for 36 yo male requiring mechanical intubation secondary to alcohol withdrawal and seizures.  Pt is now extubated and transferred to the floor. Pt was on lactulose for elevated ammonia. No longer on lactulose and amount of BM appears to have decreased. Pt has a rectal tube.  Mg goal >2 in the setting of seizure   Plan:  Mg =1.8 K=3.9 Give Mg IV 2 g x 1 Will continue Mag ox 400mg  BID for a goal Mg >2 Continue KCL 40 MEQ po BID Recheck in the AM   No Known Allergies  Patient Measurements: Height: 5\' 8"  (172.7 cm) Weight: 176 lb (79.8 kg) IBW/kg (Calculated) : 68.4  Vital Signs: Temp: 97.8 F (36.6 C) (07/29 0523) Temp Source: Oral (07/29 0523) BP: 148/79 (07/29 0523) Pulse Rate: 81 (07/29 0523) Intake/Output from previous day: 07/28 0701 - 07/29 0700 In: 1005 [P.O.:960; IV Piggyback:45] Out: 1425 [Urine:1425] Intake/Output from this shift: No intake/output data recorded.  Labs:  Recent Labs  11/08/16 0333 11/09/16 0424 11/10/16 0441  CREATININE  --  0.51* 0.49*  MG 1.7 1.7 1.8   Estimated Creatinine Clearance: 125.9 mL/min (A) (by C-G formula based on SCr of 0.49 mg/dL (L)).  Potassium (mmol/L)  Date Value  11/10/2016 3.9  11/11/2013 3.5   Sodium (mmol/L)  Date Value  11/10/2016 134 (L)  11/11/2013 145   Calcium (mg/dL)  Date Value  16/10/960407/29/2018 8.5 (L)   Calcium, Total (mg/dL)  Date Value  54/09/811907/30/2015 8.2 (L)    Pharmacy will continue to monitor and adjust per consult.   Demetrius CharityJames,Bishoy Cupp D, PharmD, BCPS Clinical Pharmacist 11/10/2016 8:03 AM

## 2016-11-10 NOTE — Clinical Social Work Note (Signed)
CSW visited with patient at bedside to discuss substance use treatment options. At the time, the patient was participating in PT. The CSW left the resources and will revisit at a more appropriate time. PT is continuing to recommend CIR. CSW will continue to follow.  Argentina PonderKaren Martha Reigna Ruperto, MSW, Theresia MajorsLCSWA [phone]

## 2016-11-10 NOTE — Progress Notes (Signed)
[facility] [place] [facility]   PATIENT NAME: Brett Washington    MR#:  [id number]  DATE OF BIRTH:  09/16/1980  SUBJECTIVE:  Patient's feeling little stronger but still very unsteady on his feet  REVIEW OF SYSTEMS:   Review of Systems  Constitutional: Negative for chills, fever and weight loss.  HENT: Negative for ear discharge, ear pain, hearing loss, nosebleeds and tinnitus.   Eyes: Negative for blurred vision, double vision and photophobia.  Respiratory: Negative for cough.   Cardiovascular: Negative for chest pain, palpitations, orthopnea and claudication.  Gastrointestinal: Negative for abdominal pain, diarrhea, heartburn, nausea and vomiting.  Genitourinary: Negative for dysuria, frequency, hematuria and urgency.  Musculoskeletal: Negative for back pain, myalgias and neck pain.  Skin: Negative for itching and rash.  Neurological: Negative for dizziness, tingling and headaches.  Endo/Heme/Allergies: Negative for environmental allergies. Does not bruise/bleed easily.  Psychiatric/Behavioral: Negative for depression and suicidal ideas. Memory loss: menatal staus back to baseling.    DRUG ALLERGIES:  No Known Allergies  VITALS:  Blood pressure (!) 148/79, pulse 81, temperature 97.8 F (36.6 C), temperature source Oral, resp. rate 16, height 5\' 8"  (1.727 m), weight 176 lb (79.8 kg), SpO2 97 %.  PHYSICAL EXAMINATION:   Physical Exam  GENERAL:  36 y.o.-year-old patient lying in the bed with no acute distress. Obese, critically ill EYES: Pupils equal, round, reactive to light and accommodation. No scleral icterus. Extraocular muscles intact.  HEENT: Head atraumatic, normocephalic. Oropharynx and nasopharynx clear.  NECK:  Supple, no jugular venous distention. No thyroid enlargement, no tenderness.  LUNGS: Normal breath sounds bilaterally, no wheezing, rales, rhonchi. No use of accessory muscles of respiration. tachycardia CARDIOVASCULAR: S1, S2  normal. No murmurs, rubs, or gallops.  ABDOMEN: Soft, nontender, nondistended. Bowel sounds present. No organomegaly or mass. Rectal tube EXTREMITIES: No cyanosis, clubbing or edema b/l.    NEUROLOGIC:  Patient has difficulty with moving his left upper extremity PSYCHIATRIC:awake and alert SKIN: No obvious rash, lesion, or ulcer.   LABORATORY PANEL:  CBC  Recent Labs Lab 11/06/16 0516  WBC 4.8  HGB 12.6*  HCT 36.7*  PLT 124*    Chemistries   Recent Labs Lab 11/05/16 0444  11/10/16 0441  NA 153*  < > 134*  K 3.4*  < > 3.9  CL 127*  < > 106  CO2 20*  < > 24  GLUCOSE 125*  < > 91  BUN 21*  < > 8  CREATININE 1.00  < > 0.49*  CALCIUM 8.6*  < > 8.5*  MG 2.2  < > 1.8  AST 208*  --   --   ALT 103*  --   --   ALKPHOS 127*  --   --   BILITOT 8.3*  --   --   < > = values in this interval not displayed. Cardiac Enzymes No results for input(s): TROPONINI in the last 168 hours. RADIOLOGY:  No results found. ASSESSMENT AND PLAN:  Brett Washington  is a 36 y.o. male who presents with Seizure episode. Patient was working when he had his seizure. He states that he has had a seizure like this before due to alcohol withdrawal. He states that he drinks about 8 years daily. His alcohol level in the ED was only 6.   * Acute DT's due to severe ETOH withdrawal - Doing better off ciwa protocol -Psych consult appreciated -Patient awaiting disposition to inpatient rehabilitation still very weak   * Sepsis  Tachycardia,fever and CXR right LL pneumonia Due to aspiration pneumonia now resolved Continue Augmentin  *Radial nerve palsy with left upper extremity weakness will need outpatient OT therapy  *Thrombocytopenia (HCC) -due to toxic effects of alcohol  *Acute hypernatremia, hyperchloremia suspected due to GI losses -Improved  * acute Hepatic Transaminitis -due to alcohol hepatitis,  Liver ultrasound consistent with hepatic steatosis  *  Hypokalemia - replace and monitoReplete  electrolytes  *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 Management plans discussed with the patient, family and they are in agreement.  CODE STATUS: FULL  DVT Prophylaxis: lovenox  TOTAL TIME TAKING CARE OF THIS PATIENT: 30 minutes.  >50% time spent on counselling and coordination of care  POSSIBLE D/C IN 1-2 DAYS, DEPENDING ON CLINICAL CONDITION.  Note: This dictation was prepared with Dragon dictation along with smaller phrase technology. Any transcriptional errors that result from this process are unintentional.  Auburn BilberryPATEL, Byrd Rushlow M.D on 11/10/2016 at 11:37 AM  Between 7am to 6pm - Pager - [phone]  After 6pm go to [email or web address] - Social research officer, governmentpassword EPAS [facility]  [facility] [place] [facility]  Office  [phone]  CC: Primary care physician; [facility]

## 2016-11-11 LAB — BASIC METABOLIC PANEL
Anion gap: 4 — ABNORMAL LOW (ref 5–15)
BUN: 7 mg/dL (ref 6–20)
CALCIUM: 8.7 mg/dL — AB (ref 8.9–10.3)
CO2: 23 mmol/L (ref 22–32)
CREATININE: 0.37 mg/dL — AB (ref 0.61–1.24)
Chloride: 107 mmol/L (ref 101–111)
GFR calc non Af Amer: >60 mL/min (ref >60)
GLUCOSE: 91 mg/dL (ref 65–99)
Potassium: 4.2 mmol/L (ref 3.5–5.1)
Sodium: 134 mmol/L — ABNORMAL LOW (ref 135–145)

## 2016-11-11 LAB — MAGNESIUM: Magnesium: 1.1 mg/dL — ABNORMAL LOW (ref 1.7–2.4)

## 2016-11-11 MED ORDER — FAMOTIDINE 20 MG PO TABS: 20.0000 mg | ORAL_TABLET | Freq: Two times a day (BID) | ORAL | 0 refills | Status: DC

## 2016-11-11 MED ORDER — MAGNESIUM SULFATE 2 GM/50ML IV SOLN: 2.0000 g | Freq: Once | INTRAVENOUS | Status: DC

## 2016-11-11 MED ORDER — ADULT MULTIVITAMIN W/MINERALS CH: 1.0000 | ORAL_TABLET | Freq: Every day | ORAL | 0 refills | Status: DC

## 2016-11-11 MED ORDER — MAGNESIUM SULFATE 4 GM/100ML IV SOLN
4.0000 g | Freq: Once | INTRAVENOUS | Status: AC
  Administered 2016-11-11: 13:00:00 4 g via INTRAVENOUS
  Filled 2016-11-11: qty 100

## 2016-11-11 MED ORDER — AMOXICILLIN-POT CLAVULANATE 875-125 MG PO TABS
1.0000 | ORAL_TABLET | Freq: Two times a day (BID) | ORAL | 0 refills | Status: AC
Start: 2016-11-11 — End: 2016-11-15

## 2016-11-11 NOTE — Discharge Instructions (Signed)
Follow with PMD in 1-2 week.

## 2016-11-11 NOTE — Progress Notes (Signed)
Pharmacy Consult for Electrolyte Monitoring  Pharmacy consulted for electrolyte management for 36 yo male requiring mechanical intubation secondary to alcohol withdrawal and seizures.  Pt is now extubated and transferred to the floor. Pt was on lactulose for elevated ammonia. No longer on lactulose and amount of BM appears to have decreased. Pt has a rectal tube.  Mg goal >2 in the setting of seizure   Plan:  Mg =1.1 K=4.2 Give Magnesium IV 4 g x 1 Will continue Mag ox 400mg  BID for a goal Mg >2 Continue KCL 40 MEQ po BID Recheck Mag at 1800 and K+ in the AM   No Known Allergies  Patient Measurements: Height: 5\' 8"  (172.7 cm) Weight: 153 lb 14.1 oz (69.8 kg) IBW/kg (Calculated) : 68.4  Vital Signs: Temp: 98.2 F (36.8 C) (07/30 0416) Temp Source: Oral (07/30 0416) BP: 124/79 (07/30 0416) Pulse Rate: 76 (07/30 0416) Intake/Output from previous day: 07/29 0701 - 07/30 0700 In: 526.4 [P.O.:480; IV Piggyback:46.4] Out: -  Intake/Output from this shift: No intake/output data recorded.  Labs:  Recent Labs  11/09/16 0424 11/10/16 0441 11/11/16 0804  CREATININE 0.51* 0.49* 0.37*  MG 1.7 1.8 1.1*   Estimated Creatinine Clearance: 125.9 mL/min (A) (by C-G formula based on SCr of 0.37 mg/dL (L)).  Potassium (mmol/L)  Date Value  11/11/2016 4.2  11/11/2013 3.5   Sodium (mmol/L)  Date Value  11/11/2016 134 (L)  11/11/2013 145   Calcium (mg/dL)  Date Value  16/10/960407/30/2018 8.7 (L)   Calcium, Total (mg/dL)  Date Value  54/09/811907/30/2015 8.2 (L)    Pharmacy will continue to monitor and adjust per consult.   Angelique BlonderMerrill,Miley Blanchett A, PharmD, BCPS Clinical Pharmacist 11/11/2016 9:26 AM

## 2016-11-11 NOTE — Progress Notes (Signed)
[facility]         [place]   11/11/2016  Patient: Brett FinerJose Anwar   Date of Birth:  04/16/1980  Date of admission:  10/31/2016  Date of Discharge  11/11/2016    To Whom it May Concern:   Brett FinerJose Zavaleta  may return to work on 11/17/16.  PHYSICAL ACTIVITY:  Full  If you have any questions or concerns, please don't hesitate to call.  Sincerely,   Altamese DillingVACHHANI, Jalyssa Fleisher M.D Pager [phone] Office : [phone]   .

## 2016-11-11 NOTE — Progress Notes (Signed)
Discharge instructions along with home medications and follow up gone over with patient and wife. Both verbalize that they understood instructions. 3 prescriptions given to patient. IV removed. Pt being discharged home on room air, no distress noted. Bryttany Tortorelli S Fenton, RN 

## 2016-11-11 NOTE — Progress Notes (Signed)
Inpatient Rehabilitation  Reviewed chart and note that patient is no longer requiring sedating medications for the CIWA protocol as well as progress with PT session yesterday.  Patient only requiring Min guard for 100 feet.  We do not have a bed to offer at this time.  Recommend home with outpatient therapy follow up at this time.  Discussed with Steward DroneBrenda.  Please call with questions.   Charlane FerrettiMelissa Tayvin Preslar, M.A., CCC/SLP Admission Coordinator  [facility]  Cell [phone]

## 2016-11-14 NOTE — Discharge Summary (Signed)
[facility] - [place] at [facility]   PATIENT NAME: Brett Washington    MR#:  [id number]  DATE OF BIRTH:  October 25, 1980  DATE OF ADMISSION:  10/31/2016 ADMITTING PHYSICIAN: Oralia Manis, MD  DATE OF DISCHARGE: 11/11/2016  4:26 PM  PRIMARY CARE PHYSICIAN: [facility]    ADMISSION DIAGNOSIS:  Hypokalemia [E87.6] Transaminitis [R74.0] Alcohol withdrawal seizure without complication (HCC) [F10.230]  DISCHARGE DIAGNOSIS:  Principal Problem:   Alcohol withdrawal seizure (HCC) Active Problems:   Thrombocytopenia (HCC)   Transaminitis   Hypokalemia   Aspiration pneumonia due to vomit (HCC)   Alcohol use disorder, severe, dependence (HCC)   SECONDARY DIAGNOSIS:   Past Medical History:  Diagnosis Date  . Alcohol abuse   . Heart murmur   . Seizures [facility])     HOSPITAL COURSE:   * Acute DT's due to severe ETOH withdrawal - Doing better off ciwa protocol -Psych consult appreciated - felt much better.  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   DISCHARGE CONDITIONS:   Stable.  CONSULTS OBTAINED:  Treatment Team:  Audery Amel, MD  DRUG ALLERGIES:  No Known Allergies  DISCHARGE MEDICATIONS:   Discharge Medication List as of 11/11/2016  3:02 PM    START taking these medications   Details  amoxicillin-clavulanate (AUGMENTIN) 875-125 MG tablet Take 1 tablet by mouth  every 12 (twelve) hours., Starting Mon 11/11/2016, Until Fri 11/15/2016, Print    famotidine (PEPCID) 20 MG tablet Take 1 tablet (20 mg total) by mouth 2 (two) times daily., Starting Mon 11/11/2016, Print    Multiple Vitamin (MULTIVITAMIN WITH MINERALS) TABS tablet Take 1 tablet by mouth daily., Starting Tue 11/12/2016, Print         DISCHARGE INSTRUCTIONS:    Follow with PMD in 1-2 weeks.  If you experience worsening of your admission symptoms, develop shortness of breath, life threatening emergency, suicidal or homicidal thoughts you must seek medical attention immediately by calling 911 or calling your MD immediately  if symptoms less severe.  You Must read complete instructions/literature along with all the possible adverse reactions/side effects for all the Medicines you take and that have been prescribed to you. Take any new Medicines after you have completely understood and accept all the possible adverse reactions/side effects.   Please note  You were cared for by a hospitalist during your hospital stay. If you have any questions about your discharge medications or the care you received while you were in the hospital after you are discharged, you can call the unit and asked to speak with the hospitalist on call if the hospitalist that took care of you is not available. Once you are discharged, your primary care physician will handle any further medical issues. Please note that NO REFILLS for any discharge medications will be authorized once you are discharged, as it is imperative that you return to your primary care physician (or establish a relationship with a primary care physician if you do not have one) for your aftercare needs so that they can  reassess your need for medications and monitor your lab values.    Today   CHIEF COMPLAINT:   Chief Complaint  Patient presents with  . Seizures    HISTORY OF PRESENT ILLNESS:  Brett Washington  is a 36 y.o. male presents with Seizure  episode. Patient was working when he had his seizure. He states that he has had a seizure like this before due to alcohol withdrawal. He states that he drinks about 8 years daily. His alcohol level in the ED today is only 6. Hospitalists were called for admission for alcohol withdrawal. Of note, patient also has thrombocytopenia on evaluation here, as well as transaminitis.  VITAL SIGNS:  Blood pressure 132/69, pulse 79, temperature 98.6 F (37 C), temperature source Oral, resp. rate 16, height 5\' 8"  (1.727 m), weight 69.8 kg (153 lb 14.1 oz), SpO2 100 %.  I/O:  No intake or output data in the 24 hours ending 11/14/16 1029  PHYSICAL EXAMINATION:  GENERAL:  36 y.o.-year-old patient lying in the bed with no acute distress.  EYES: Pupils equal, round, reactive to light and accommodation. No scleral icterus. Extraocular muscles intact.  HEENT: Head atraumatic, normocephalic. Oropharynx and nasopharynx clear.  NECK:  Supple, no jugular venous distention. No thyroid enlargement, no tenderness.  LUNGS: Normal breath sounds bilaterally, no wheezing, rales,rhonchi or crepitation. No use of accessory muscles of respiration.  CARDIOVASCULAR: S1, S2 normal. No murmurs, rubs, or gallops.  ABDOMEN: Soft, non-tender, non-distended. Bowel sounds present. No organomegaly or mass.  EXTREMITIES: No pedal edema, cyanosis, or clubbing.  NEUROLOGIC: Cranial nerves II through XII are intact. Muscle strength 5/5 in all extremities. Sensation intact. Gait not checked.  PSYCHIATRIC: The patient is alert and oriented x 3.  SKIN: No obvious rash, lesion, or ulcer.   DATA REVIEW:   CBC No results for input(s): WBC, HGB, HCT, PLT in the last 168 hours.  Chemistries   Recent Labs Lab 11/11/16 0804  NA 134*  K 4.2  CL 107  CO2 23  GLUCOSE 91  BUN 7  CREATININE 0.37*  CALCIUM 8.7*  MG 1.1*    Cardiac Enzymes No results for input(s): TROPONINI in the last 168 hours.  Microbiology Results  Results for  orders placed or performed during the hospital encounter of 10/31/16  MRSA PCR Screening     Status: None   Collection Time: 11/01/16  3:04 PM  Result Value Ref Range Status   MRSA by PCR NEGATIVE NEGATIVE Final    Comment:        The GeneXpert MRSA Assay (FDA approved for NASAL specimens only), is one component of a comprehensive MRSA colonization surveillance program. It is not intended to diagnose MRSA infection nor to guide or monitor treatment for MRSA infections.   Culture, respiratory (NON-Expectorated)     Status: None   Collection Time: 11/03/16  2:15 PM  Result Value Ref Range Status   Specimen Description TRACHEAL ASPIRATE  Final   Special Requests NONE  Final   Gram Stain   Final    MODERATE WBC PRESENT,BOTH PMN AND MONONUCLEAR MODERATE GRAM NEGATIVE RODS MODERATE GRAM POSITIVE COCCI RARE GRAM POSITIVE RODS    Culture   Final    Consistent with normal respiratory flora. Performed at [facility], [address] [place], [place] [address]    Report Status 11/06/2016 FINAL  Final    RADIOLOGY:  No results found.  EKG:   Orders placed or performed during the hospital encounter of 10/31/16  .  ED EKG  . ED EKG  . EKG 12-Lead  . EKG 12-Lead  . EKG      Management plans discussed with the patient, family and they are in agreement.  CODE STATUS:  Code Status History    Date Active Date Inactive Code Status Order ID Comments User Context   10/31/2016  3:42 PM 11/11/2016  7:26 PM Full Code [id number]  Oralia ManisWillis, David, MD ED      TOTAL TIME TAKING CARE OF THIS PATIENT: 35 minutes.    Altamese DillingVACHHANI, Masyn Fullam M.D on 11/14/2016 at 10:29 AM  Between 7am to 6pm - Pager - [phone]  After 6pm go to [email or web address] - Social research officer, governmentpassword [email or web address]  [facility]  Office  [phone]  CC: Primary care physician; [facility]   Note: This dictation was prepared with Nurse, children'sDragon dictation along with smaller phrase technology. Any  transcriptional errors that result from this process are unintentional.

## 2017-01-11 ENCOUNTER — Observation Stay: Payer: Self-pay

## 2017-01-11 ENCOUNTER — Observation Stay
Admission: EM | Admit: 2017-01-11 | Discharge: 2017-01-12 | Disposition: A | Discharge status: Home / Self Care | Payer: Self-pay | Attending: Internal Medicine | Admitting: Internal Medicine

## 2017-01-11 ENCOUNTER — Encounter: Payer: Self-pay | Admitting: Emergency Medicine

## 2017-01-11 DIAGNOSIS — R569 Unspecified convulsions: Secondary | ICD-10-CM

## 2017-01-11 DIAGNOSIS — F10239 Alcohol dependence with withdrawal, unspecified: Principal | ICD-10-CM | POA: Insufficient documentation

## 2017-01-11 DIAGNOSIS — F1020 Alcohol dependence, uncomplicated: Secondary | ICD-10-CM | POA: Diagnosis present

## 2017-01-11 DIAGNOSIS — R740 Nonspecific elevation of levels of transaminase and lactic acid dehydrogenase [LDH]: Secondary | ICD-10-CM | POA: Insufficient documentation

## 2017-01-11 DIAGNOSIS — R55 Syncope and collapse: Secondary | ICD-10-CM | POA: Insufficient documentation

## 2017-01-11 DIAGNOSIS — Z23 Encounter for immunization: Secondary | ICD-10-CM | POA: Insufficient documentation

## 2017-01-11 DIAGNOSIS — Y900 Blood alcohol level of less than 20 mg/100 ml: Secondary | ICD-10-CM | POA: Insufficient documentation

## 2017-01-11 DIAGNOSIS — F10939 Alcohol use, unspecified with withdrawal, unspecified: Secondary | ICD-10-CM

## 2017-01-11 DIAGNOSIS — E876 Hypokalemia: Secondary | ICD-10-CM | POA: Insufficient documentation

## 2017-01-11 DIAGNOSIS — Z79899 Other long term (current) drug therapy: Secondary | ICD-10-CM | POA: Insufficient documentation

## 2017-01-11 DIAGNOSIS — I4510 Unspecified right bundle-branch block: Secondary | ICD-10-CM | POA: Insufficient documentation

## 2017-01-11 DIAGNOSIS — R4020 Unspecified coma: Secondary | ICD-10-CM | POA: Diagnosis present

## 2017-01-11 DIAGNOSIS — D696 Thrombocytopenia, unspecified: Secondary | ICD-10-CM | POA: Insufficient documentation

## 2017-01-11 LAB — COMPREHENSIVE METABOLIC PANEL
ALK PHOS: 123 U/L (ref 38–126)
ALT: 48 U/L (ref 17–63)
ANION GAP: 16 — AB (ref 5–15)
AST: 114 U/L — ABNORMAL HIGH (ref 15–41)
Albumin: 4.5 g/dL (ref 3.5–5.0)
BUN: 9 mg/dL (ref 6–20)
CALCIUM: 10.1 mg/dL (ref 8.9–10.3)
CO2: 22 mmol/L (ref 22–32)
Chloride: 100 mmol/L — ABNORMAL LOW (ref 101–111)
Creatinine, Ser: 0.85 mg/dL (ref 0.61–1.24)
GFR calc non Af Amer: >60 mL/min (ref >60)
Glucose, Bld: 208 mg/dL — ABNORMAL HIGH (ref 65–99)
POTASSIUM: 3.6 mmol/L (ref 3.5–5.1)
SODIUM: 138 mmol/L (ref 135–145)
Total Bilirubin: 2.6 mg/dL — ABNORMAL HIGH (ref 0.3–1.2)
Total Protein: 8.5 g/dL — ABNORMAL HIGH (ref 6.5–8.1)

## 2017-01-11 LAB — CBC WITH DIFFERENTIAL/PLATELET
BASOS ABS: 0.1 10*3/uL (ref 0–0.1)
BASOS PCT: 1 %
EOS ABS: 0 10*3/uL (ref 0–0.7)
Eosinophils Relative: 0 %
HEMATOCRIT: 40.7 % (ref 40.0–52.0)
HEMOGLOBIN: 14 g/dL (ref 13.0–18.0)
Lymphocytes Relative: 15 %
Lymphs Abs: 0.7 10*3/uL — ABNORMAL LOW (ref 1.0–3.6)
MCH: 31.9 pg (ref 26.0–34.0)
MCHC: 34.5 g/dL (ref 32.0–36.0)
MCV: 92.6 fL (ref 80.0–100.0)
MONOS PCT: 7 %
Monocytes Absolute: 0.4 10*3/uL (ref 0.2–1.0)
NEUTROS PCT: 77 %
Neutro Abs: 3.8 10*3/uL (ref 1.4–6.5)
PLATELETS: 78 10*3/uL — AB (ref 150–440)
RBC: 4.39 MIL/uL — AB (ref 4.40–5.90)
RDW: 13.1 % (ref 11.5–14.5)
WBC: 4.9 10*3/uL (ref 3.8–10.6)

## 2017-01-11 LAB — TROPONIN I: Troponin I: <0.03 ng/mL (ref <0.03)

## 2017-01-11 LAB — ETHANOL

## 2017-01-11 MED ORDER — ONDANSETRON HCL 4 MG/2ML IJ SOLN
4.0000 mg | Freq: Once | INTRAMUSCULAR | Status: AC
  Administered 2017-01-11: 4 mg via INTRAVENOUS

## 2017-01-11 MED ORDER — INFLUENZA VAC SPLIT QUAD 0.5 ML IM SUSY
0.5000 mL | PREFILLED_SYRINGE | INTRAMUSCULAR | Status: AC
  Administered 2017-01-12: 08:00:00 0.5 mL via INTRAMUSCULAR
  Filled 2017-01-11: qty 0.5

## 2017-01-11 MED ORDER — DIAZEPAM 5 MG PO TABS
5.0000 mg | ORAL_TABLET | Freq: Once | ORAL | Status: AC
  Administered 2017-01-11: 5 mg via ORAL
  Filled 2017-01-11: qty 1

## 2017-01-11 MED ORDER — SODIUM CHLORIDE 0.9 % IV BOLUS (SEPSIS)
1000.0000 mL | Freq: Once | INTRAVENOUS | Status: AC
  Administered 2017-01-11: 1000 mL via INTRAVENOUS

## 2017-01-11 MED ORDER — ONDANSETRON HCL 4 MG/2ML IJ SOLN
INTRAMUSCULAR | Status: AC
  Filled 2017-01-11: qty 2

## 2017-01-11 MED ORDER — SODIUM CHLORIDE 0.9 % IV BOLUS (SEPSIS)
1000.0000 mL | Freq: Once | INTRAVENOUS | Status: AC
Start: 2017-01-11 — End: 2017-01-11
  Administered 2017-01-11: 1000 mL via INTRAVENOUS

## 2017-01-11 MED ORDER — PNEUMOCOCCAL VAC POLYVALENT 25 MCG/0.5ML IJ INJ
0.5000 mL | INJECTION | INTRAMUSCULAR | Status: AC
  Administered 2017-01-12: 08:00:00 0.5 mL via INTRAMUSCULAR
  Filled 2017-01-11: qty 0.5

## 2017-01-11 MED ORDER — DIAZEPAM 5 MG PO TABS
5.0000 mg | ORAL_TABLET | Freq: Once | ORAL | Status: AC
Start: 2017-01-11 — End: 2017-01-11
  Administered 2017-01-11: 5 mg via ORAL
  Filled 2017-01-11: qty 1

## 2017-01-11 NOTE — ED Notes (Addendum)
RN unavailable for report att, will call back to [phone]

## 2017-01-11 NOTE — ED Notes (Signed)
CT called for order, will CT prior to floor admission

## 2017-01-11 NOTE — ED Provider Notes (Signed)
[facility] Emergency Department Provider Note ____________________________________________   First MD Initiated Contact with Patient 01/11/17 1648     (approximate)  I have reviewed the triage vital signs and the nursing notes.   HISTORY  Chief Complaint Altered Mental Status    HPI Brett Washington is a 36 y.o. male with a history of alcohol abuse and seizures who presents with possible seizure versus syncope, acute onset a few hours ago while patient was doing yard work, associated with spasm-like movement, then shaking, and then being sleepy. Patient's wife states it is similar to a prior seizure he had related to alcohol withdrawal. Patient states that he drinks most days and see his family state that he drinks every day) and typically 6-8 beers per day. He states that he last drank 3 days ago. Patient states that he now feels just generalized weakness but denies any other acute symptoms.  No chest pain, shortness of breath, palpitations, or fever. No recent illness.   Past Medical History:  Diagnosis Date  . Alcohol abuse   . Heart murmur   . Seizures [facility])     Patient Active Problem List   Diagnosis Date Noted  . Alcohol use disorder, severe, dependence (HCC) 11/06/2016  . Aspiration pneumonia due to vomit (HCC)   . Alcohol withdrawal seizure (HCC) 10/31/2016  . Thrombocytopenia (HCC) 10/31/2016  . Transaminitis 10/31/2016  . Hypokalemia 10/31/2016    Past Surgical History:  Procedure Laterality Date  . HAND SURGERY      Prior to Admission medications   Medication Sig Start Date End Date Taking? Authorizing Provider  famotidine (PEPCID) 20 MG tablet Take 1 tablet (20 mg total) by mouth 2 (two) times daily. 11/11/16   Altamese Dilling, MD  Multiple Vitamin (MULTIVITAMIN WITH MINERALS) TABS tablet Take 1 tablet by mouth daily. 11/12/16   Altamese Dilling, MD    Allergies Patient has no known allergies.  Family History  Problem  Relation Age of Onset  . Family history unknown: Yes    Social History Social History  Substance Use Topics  . Smoking status: Never Smoker  . Smokeless tobacco: Never Used  . Alcohol use 33.6 oz/week    56 Cans of beer per week    Review of Systems  Constitutional: No fever. Eyes: No visual changes. ENT: No sore throat. Cardiovascular: Denies chest pain. Respiratory: Denies shortness of breath. Gastrointestinal: No vomiting.  Genitourinary: Negative for dysuria.  Musculoskeletal: Negative for back pain. Skin: Negative for rash. Neurological: Negative for headache.   ____________________________________________   PHYSICAL EXAM:  VITAL SIGNS: ED Triage Vitals [01/11/17 1633]  Enc Vitals Group     BP 122/72     Pulse Rate (!) 108     Resp 16     Temp 98.1 F (36.7 C)     Temp Source Oral     SpO2 96 %     Weight 153 lb (69.4 kg)     Height      Head Circumference      Peak Flow      Pain Score 0     Pain Loc      Pain Edu?      Excl. in GC?     Constitutional: Alert and oriented. Uncomfortable appearing.  Eyes: Conjunctivae are normal.  EOMI.  PERRLA.  Pupils slightly dilated.  Head: Atraumatic. Nose: No congestion/rhinnorhea. Mouth/Throat: Mucous membranes are dry.  Neck: Normal range of motion.  Cardiovascular: Normal rate, regular rhythm. Grossly normal  heart sounds.  Good peripheral circulation. Respiratory: Normal respiratory effort.  No retractions. Lungs CTAB. Gastrointestinal: Soft and nontender. No distention.  Genitourinary: No CVA tenderness. Musculoskeletal: No lower extremity edema.  Extremities warm and well perfused.  Neurologic:  Normal speech and language. No gross focal neurologic deficits are appreciated.  Bilat mild tremor.  Tongue fasciculation present.  Skin:  Skin is warm and dry. No rash noted. Psychiatric: Mood and affect are normal. Speech and behavior are normal.  ____________________________________________   LABS (all  labs ordered are listed, but only abnormal results are displayed)  Labs Reviewed  CBC WITH DIFFERENTIAL/PLATELET - Abnormal; Notable for the following:       Result Value   RBC 4.39 (*)    Platelets 78 (*)    Lymphs Abs 0.7 (*)    All other components within normal limits  COMPREHENSIVE METABOLIC PANEL - Abnormal; Notable for the following:    Chloride 100 (*)    Glucose, Bld 208 (*)    Total Protein 8.5 (*)    AST 114 (*)    Total Bilirubin 2.6 (*)    Anion gap 16 (*)    All other components within normal limits  TROPONIN I  ETHANOL   ____________________________________________  EKG  ED ECG REPORT I, Dionne Bucy, the attending physician, personally viewed and interpreted this ECG.  Date: 01/11/2017 EKG Time: 1631 Rate: 110 Rhythm: sinus tachycardia QRS Axis: normal Intervals: incomplete right bundle, left anterior fascicular block ST/T Wave abnormalities: normal Narrative Interpretation: no evidence of acute ischemia; no significant change when compared to EKG of 11/12/2016  ____________________________________________  RADIOLOGY    ____________________________________________   PROCEDURES  Procedure(s) performed: No    Critical Care performed: No ____________________________________________   INITIAL IMPRESSION / ASSESSMENT AND PLAN / ED COURSE  Pertinent labs & imaging results that were available during my care of the patient were reviewed by me and considered in my medical decision making (see chart for details).  36 year old male with history of alcohol abuse presents with apparent seizure versus syncope today while doing yardwork. In ED patient is tachycardic, other vital signs are normal, and exam is as described with mild tremor and tongue fasciculation.  Patient had an admission in July for seizure thought to be related to alcohol withdrawal and had CT and other workup at that time.  in the being admitted at that time for about 10 days.  Per family, patient continues to drink every day; he states his last drink now was 3 days ago.  Overall suspect most likely seizure related to alcohol withdrawal, versus vasovagal syncope due to dehydration and doing yard work on a hot day. Also possible that he could have mild withdrawal leading to vasovagal syncope. Patient's vital signs are relatively reassuring if this is withdrawal. Plan: Labs, fluids, by mouth Valium, and reassess. If workup is unremarkable and vital signs completely normalize, consider possible discharge home although patient will likely need admission given his risk of having further alcohol w/d related seizure or other sx.     ----------------------------------------- 8:35 PM on 01/11/2017 -----------------------------------------  Lab workup is unremarkable. On reassessment, patient continues to have heart rate in the low 100s, and continues to be significantly tremulous especially when getting up and walk around. Although his vital signs are not as concerning for acute alcohol withdrawal and he has not required IV medications, given his known history of alcohol withdrawal seizures in the high likelihood that this was an alcohol-related seizure, I do not believe it  is safe for patient to be discharged at this time and I feel he should continue to be monitored for recurrent symptoms and treated with w/d protocol.  Will proceed with admission.   ____________________________________________   FINAL CLINICAL IMPRESSION(S) / ED DIAGNOSES  Final diagnoses:  Alcohol withdrawal syndrome with complication (HCC)  Seizure (HCC)      NEW MEDICATIONS STARTED DURING THIS VISIT:  New Prescriptions   No medications on file     Note:  This document was prepared using Dragon voice recognition software and may include unintentional dictation errors.    Dionne Bucy, MD 01/11/17 2037

## 2017-01-11 NOTE — ED Triage Notes (Signed)
Pt to ed with c/o ?seizure while working in the yard today.  Per pt wife, pt is a heavy drinker and often has "shaking" when he does not drink alcohol.  Per wife pt was standing and doing yard work and began to raise arms in the air and shake them and then he fell backwards. Per pt wife pt was unresponsive, "foaming at the mouth" and shaking all over for about 3 minutes.  Pt extremely diaphoretic at triage.

## 2017-01-11 NOTE — H&P (Signed)
PCP:   [facility]   Chief Complaint:  Loss of consciousness  HPI: This is a 36 year old male who is unaffected and when the grass when he had an episode of loss of consciousness. He was advised by his wife. There is no seizure activity. It lasted approximately 3 minutes. She called 911 and he was brought to the ER. The patient has history of alcohol abuse. He drinks approximately 6-7 beers daily. He had a withdrawal seizure approx around 6 months ago. He states he has not drank in the  Last 3 days because of his wife's complaining. Prior to todays syncopal episode he was not ill except he did have an episode of nausea and vomiting in the morning and again in the ER. No evidence of hematemesis. He denies any fever, chills, chest pains. During my interview  the patient is jittery  Review of Systems:  The patient denies anorexia, fever, weight loss,, vision loss, decreased hearing, LOC, hoarseness, chest pain, syncope, dyspnea on exertion, peripheral edema, balance deficits, hemoptysis, abdominal pain, melena, hematochezia, severe indigestion/heartburn, hematuria, incontinence, genital sores, muscle weakness, suspicious skin lesions, transient blindness, difficulty walking, depression, unusual weight change, abnormal bleeding, enlarged lymph nodes, angioedema, and breast masses.  Past Medical History: Past Medical History:  Diagnosis Date  . Alcohol abuse   . Heart murmur   . Seizures (HCC)    Past Surgical History:  Procedure Laterality Date  . HAND SURGERY      Medications: Prior to Admission medications   Not on File    Allergies:  No Known Allergies  Social History:  reports that he has never smoked. He has never used smokeless tobacco. He reports that he drinks about 33.6 oz of alcohol per week . He reports that he does not use drugs.  Family History: Family History  Problem Relation Age of Onset  . Family history unknown: Yes    Physical  Exam: Vitals:   01/11/17 1721 01/11/17 1733 01/11/17 1900 01/11/17 2125  BP:  140/88 130/89 104/84  Pulse: (!) 101 (!) 102 97 95  Resp: 17 (!) Temp:      TempSrc:      SpO2: 98% 98% 99% 99%  Weight:        General:  Alert and oriented times three, well developed and nourished, jittery male Eyes: PERRLA, pink conjunctiva, no scleral icterus ENT: Moist oral mucosa, neck supple, no thyromegaly Lungs: clear to ascultation, no wheeze, no crackles, no use of accessory muscles Cardiovascular: regular rate and rhythm, no regurgitation, no gallops,  murmurs. No carotid bruits, no JVD Abdomen: soft, positive BS, non-tender, non-distended, no organomegaly, not an acute abdomen GU: not examined Neuro: CN II - XII grossly intact, sensation intact Musculoskeletal: strength 5/5 all extremities, no clubbing, cyanosis or edema Skin: no rash, no subcutaneous crepitation, no decubitus Psych: appropriate patient   Labs on Admission:   Recent Labs  01/11/17 1640  NA 138  K 3.6  CL 100*  CO2 22  GLUCOSE 208*  BUN 9  CREATININE 0.85  CALCIUM 10.1    Recent Labs  01/11/17 1640  AST 114*  ALT 48  ALKPHOS 123  BILITOT 2.6*  PROT 8.5*  ALBUMIN 4.5   No results for input(s): LIPASE, AMYLASE in the last 72 hours.  Recent Labs  01/11/17 1640  WBC 4.9  NEUTROABS 3.8  HGB 14.0  HCT 40.7  MCV 92.6  PLT 78*    Recent Labs  01/11/17  1640  TROPONINI <0.03   Invalid input(s): POCBNP No results for input(s): DDIMER in the last 72 hours. No results for input(s): HGBA1C in the last 72 hours. No results for input(s): CHOL, HDL, LDLCALC, TRIG, CHOLHDL, LDLDIRECT in the last 72 hours. No results for input(s): TSH, T4TOTAL, T3FREE, THYROIDAB in the last 72 hours.  Invalid input(s): FREET3 No results for input(s): VITAMINB12, FOLATE, FERRITIN, TIBC, IRON, RETICCTPCT in the last 72 hours.  Micro Results: No results found for this or any previous visit (from the past 240  hour(s)).   Radiological Exams on Admission: No results found.  Assessment/Plan Present on Admission: . LOC (loss of consciousness) (HCC) -Bring in for observation on med telemetry -Possibly withdrawal seizure. Patient has not drunk in 3 days. He is jittery. -We will order a UDS, alcohol level  History of alcohol WITHDRAWAL seizure -CIWA protocol  Hyperglycemia -No known history of diabetes mellitus.  BMP in a.m.  Transaminitis -Improved from last admission. CMP in a.m.  Marland Kitchen Alcohol use disorder, severe, dependence (HCC) -   Shevonne Wolf 01/11/2017, 9:41 PM

## 2017-01-11 NOTE — ED Notes (Signed)
Patient normally drinks a 6 pack or more a day and his wife reported he has not been drinking as much the past three days because she took his wallet away. Wife also states she witnessed seizure happening and called EMS. Reported "He could not answer questions for about 3 minutes after seizure then started to wake up"

## 2017-01-12 LAB — CBC
HCT: 38.4 % — ABNORMAL LOW (ref 40.0–52.0)
Hemoglobin: 13.4 g/dL (ref 13.0–18.0)
MCH: 32.8 pg (ref 26.0–34.0)
MCHC: 34.9 g/dL (ref 32.0–36.0)
MCV: 93.9 fL (ref 80.0–100.0)
Platelets: 55 10*3/uL — ABNORMAL LOW (ref 150–440)
RBC: 4.1 MIL/uL — ABNORMAL LOW (ref 4.40–5.90)
RDW: 13.4 % (ref 11.5–14.5)
WBC: 5.5 10*3/uL (ref 3.8–10.6)

## 2017-01-12 LAB — URINE DRUG SCREEN, QUALITATIVE (ARMC ONLY)
Amphetamines, Ur Screen: NOT DETECTED
BARBITURATES, UR SCREEN: NOT DETECTED
BENZODIAZEPINE, UR SCRN: POSITIVE — AB
Cannabinoid 50 Ng, Ur ~~LOC~~: NOT DETECTED
Cocaine Metabolite,Ur ~~LOC~~: NOT DETECTED
MDMA (ECSTASY) UR SCREEN: NOT DETECTED
Methadone Scn, Ur: NOT DETECTED
Opiate, Ur Screen: NOT DETECTED
PHENCYCLIDINE (PCP) UR S: NOT DETECTED
TRICYCLIC, UR SCREEN: NOT DETECTED

## 2017-01-12 LAB — COMPREHENSIVE METABOLIC PANEL
ALK PHOS: 92 U/L (ref 38–126)
ALT: 39 U/L (ref 17–63)
AST: 91 U/L — AB (ref 15–41)
Albumin: 3.6 g/dL (ref 3.5–5.0)
Anion gap: 7 (ref 5–15)
CALCIUM: 9.1 mg/dL (ref 8.9–10.3)
CHLORIDE: 104 mmol/L (ref 101–111)
CO2: 27 mmol/L (ref 22–32)
CREATININE: 0.45 mg/dL — AB (ref 0.61–1.24)
GFR calc non Af Amer: >60 mL/min (ref >60)
Glucose, Bld: 101 mg/dL — ABNORMAL HIGH (ref 65–99)
Potassium: 3.1 mmol/L — ABNORMAL LOW (ref 3.5–5.1)
SODIUM: 138 mmol/L (ref 135–145)
Total Bilirubin: 1.9 mg/dL — ABNORMAL HIGH (ref 0.3–1.2)
Total Protein: 7.1 g/dL (ref 6.5–8.1)

## 2017-01-12 LAB — MAGNESIUM: MAGNESIUM: 1.9 mg/dL (ref 1.7–2.4)

## 2017-01-12 MED ORDER — ONDANSETRON HCL 4 MG PO TABS: 4.0000 mg | ORAL_TABLET | Freq: Four times a day (QID) | ORAL | Status: DC | PRN

## 2017-01-12 MED ORDER — LORAZEPAM 1 MG PO TABS: 1.0000 mg | ORAL_TABLET | Freq: Four times a day (QID) | ORAL | Status: DC | PRN

## 2017-01-12 MED ORDER — POTASSIUM CHLORIDE IN NACL 20-0.9 MEQ/L-% IV SOLN
INTRAVENOUS | Status: DC
  Administered 2017-01-12 (×2): via INTRAVENOUS
  Filled 2017-01-12 (×3): qty 1000

## 2017-01-12 MED ORDER — FOLIC ACID 1 MG PO TABS: 1.0000 mg | ORAL_TABLET | Freq: Every day | ORAL | 0 refills | Status: DC

## 2017-01-12 MED ORDER — POTASSIUM CHLORIDE CRYS ER 20 MEQ PO TBCR
40.0000 meq | EXTENDED_RELEASE_TABLET | Freq: Once | ORAL | Status: AC
  Administered 2017-01-12: 10:00:00 40 meq via ORAL
  Filled 2017-01-12: qty 2

## 2017-01-12 MED ORDER — THIAMINE HCL 100 MG PO TABS: 100.0000 mg | ORAL_TABLET | Freq: Every day | ORAL | 0 refills | Status: DC

## 2017-01-12 MED ORDER — LORAZEPAM 2 MG/ML IJ SOLN
0.0000 mg | Freq: Four times a day (QID) | INTRAMUSCULAR | Status: DC
Start: 2017-01-12 — End: 2017-01-12

## 2017-01-12 MED ORDER — POLYETHYLENE GLYCOL 3350 17 G PO PACK: 17.0000 g | PACK | Freq: Every day | ORAL | Status: DC | PRN

## 2017-01-12 MED ORDER — ADULT MULTIVITAMIN W/MINERALS CH
1.0000 | ORAL_TABLET | Freq: Every day | ORAL | Status: DC
Start: 2017-01-12 — End: 2017-01-12
  Administered 2017-01-12: 1 via ORAL
  Filled 2017-01-12: qty 1

## 2017-01-12 MED ORDER — ONDANSETRON HCL 4 MG/2ML IJ SOLN: 4.0000 mg | Freq: Four times a day (QID) | INTRAMUSCULAR | Status: DC | PRN

## 2017-01-12 MED ORDER — ENOXAPARIN SODIUM 40 MG/0.4ML ~~LOC~~ SOLN
40.0000 mg | SUBCUTANEOUS | Status: DC
  Filled 2017-01-12: qty 0.4

## 2017-01-12 MED ORDER — LORAZEPAM 2 MG/ML IJ SOLN: 1.0000 mg | Freq: Four times a day (QID) | INTRAMUSCULAR | Status: DC | PRN

## 2017-01-12 MED ORDER — FOLIC ACID 1 MG PO TABS
1.0000 mg | ORAL_TABLET | Freq: Every day | ORAL | Status: DC
  Administered 2017-01-12: 08:00:00 1 mg via ORAL
  Filled 2017-01-12: qty 1

## 2017-01-12 MED ORDER — THIAMINE HCL 100 MG/ML IJ SOLN
100.0000 mg | Freq: Every day | INTRAMUSCULAR | Status: DC
  Filled 2017-01-12: qty 1

## 2017-01-12 MED ORDER — LORAZEPAM 2 MG/ML IJ SOLN
0.0000 mg | Freq: Two times a day (BID) | INTRAMUSCULAR | Status: DC
Start: 2017-01-14 — End: 2017-01-12

## 2017-01-12 MED ORDER — VITAMIN B-1 100 MG PO TABS
100.0000 mg | ORAL_TABLET | Freq: Every day | ORAL | Status: DC
  Administered 2017-01-12: 08:00:00 100 mg via ORAL
  Filled 2017-01-12: qty 1

## 2017-01-12 NOTE — Discharge Summary (Signed)
[facility] - [place] at [facility]   PATIENT NAME: Brett Washington    MR#:  [id number]  DATE OF BIRTH:  11-Dec-1980  DATE OF ADMISSION:  01/11/2017   ADMITTING PHYSICIAN: Gery Pray, MD  DATE OF DISCHARGE: 01/12/2017 10:41 AM  PRIMARY CARE PHYSICIAN: [facility]   ADMISSION DIAGNOSIS:  Seizure [facility]) [R56.9] Alcohol withdrawal syndrome with complication (HCC) [F10.239] DISCHARGE DIAGNOSIS:  Active Problems:   Alcohol withdrawal seizure (HCC)   Alcohol use disorder, severe, dependence (HCC)   LOC (loss of consciousness) (HCC)  SECONDARY DIAGNOSIS:   Past Medical History:  Diagnosis Date  . Alcohol abuse   . Heart murmur   . Seizures (HCC)    HOSPITAL COURSE:   . LOC (loss of consciousness) (HCC) -Possibly withdrawal seizure. Patient has not drunk in 3 days. He was jittery. He is alert, awake and oriented, has no complaints.  History of alcohol WITHDRAWAL seizure -CIWA protocol. No signs of withdrawal. Continue folic acid and thiamine by mouth.  Transaminitis Possible related to alcohol abuse. Bilirubin is better.  . Alcohol use disorder, severe, dependence (HCC) Advised cessation. Hypokalemia. Give potassium supplement, follow-up potassium level as outpatient. Magnesium level is normal. Thrombocytopenia. Likely due to alcohol abuse. Follow-up with his PCP. The patient wants to go home today. DISCHARGE CONDITIONS:  Stable, discharge to home today. CONSULTS OBTAINED:   DRUG ALLERGIES:  No Known Allergies DISCHARGE MEDICATIONS:   Allergies as of 01/12/2017   No Known Allergies     Medication List    TAKE these medications   folic acid 1 MG tablet Commonly known as:  FOLVITE Take 1 tablet (1 mg total) by mouth daily.   thiamine 100 MG tablet Take 1 tablet (100 mg total) by mouth daily.            Discharge Care Instructions        Start     Ordered   01/13/17 0000  thiamine 100 MG tablet  Daily       01/12/17 1023   01/13/17 0000  folic acid (FOLVITE) 1 MG tablet  Daily     01/12/17 1023   01/12/17 0000  Increase activity slowly     01/12/17 1024   01/12/17 0000  Diet - low sodium heart healthy     01/12/17 1024       DISCHARGE INSTRUCTIONS:  See AVS.  If you experience worsening of your admission symptoms, develop shortness of breath, life threatening emergency, suicidal or homicidal thoughts you must seek medical attention immediately by calling 911 or calling your MD immediately  if symptoms less severe.  You Must read complete instructions/literature along with all the possible adverse reactions/side effects for all the Medicines you take and that have been prescribed to you. Take any new Medicines after you have completely understood and accpet all the possible adverse reactions/side effects.   Please note  You were cared for by a hospitalist during your hospital stay. If you have any questions about your discharge medications or the care you received while you were in the hospital after you are discharged, you can call the unit and asked to speak with the hospitalist on call if the hospitalist that took care of you is not available. Once you are discharged, your primary care physician will handle any further medical issues. Please note that NO REFILLS for any discharge medications will be authorized once you are discharged, as it is imperative that you return to your primary  care physician (or establish a relationship with a primary care physician if you do not have one) for your aftercare needs so that they can reassess your need for medications and monitor your lab values.    On the day of Discharge:  VITAL SIGNS:  Blood pressure (!) 141/89, pulse 82, temperature 98.3 F (36.8 C), temperature source Oral, resp. rate 20, height  (1.727 m), weight 194 lb 8 oz (88.2 kg), SpO2 100 %. PHYSICAL EXAMINATION:  GENERAL:  36 y.o.-year-old patient lying in the bed with no acute  distress.  EYES: Pupils equal, round, reactive to light and accommodation. No scleral icterus. Extraocular muscles intact.  HEENT: Head atraumatic, normocephalic. Oropharynx and nasopharynx clear.  NECK:  Supple, no jugular venous distention. No thyroid enlargement, no tenderness.  LUNGS: Normal breath sounds bilaterally, no wheezing, rales,rhonchi or crepitation. No use of accessory muscles of respiration.  CARDIOVASCULAR: S1, S2 normal. No murmurs, rubs, or gallops.  ABDOMEN: Soft, non-tender, non-distended. Bowel sounds present. No organomegaly or mass.  EXTREMITIES: No pedal edema, cyanosis, or clubbing.  NEUROLOGIC: Cranial nerves II through XII are intact. Muscle strength 5/5 in all extremities. Sensation intact. Gait not checked.  PSYCHIATRIC: The patient is alert and oriented x 3.  SKIN: No obvious rash, lesion, or ulcer.  DATA REVIEW:   CBC  Recent Labs Lab 01/12/17 0456  WBC 5.5  HGB 13.4  HCT 38.4*  PLT 55*    Chemistries   Recent Labs Lab 01/12/17 0456 01/12/17 0500  NA 138  --   K 3.1*  --   CL 104  --   CO2 27  --   GLUCOSE 101*  --   BUN <5*  --   CREATININE 0.45*  --   CALCIUM 9.1  --   MG  --  1.9  AST 91*  --   ALT 39  --   ALKPHOS 92  --   BILITOT 1.9*  --      Microbiology Results  Results for orders placed or performed during the hospital encounter of 10/31/16  MRSA PCR Screening     Status: None   Collection Time: 11/01/16  3:04 PM  Result Value Ref Range Status   MRSA by PCR NEGATIVE NEGATIVE Final    Comment:        The GeneXpert MRSA Assay (FDA approved for NASAL specimens only), is one component of a comprehensive MRSA colonization surveillance program. It is not intended to diagnose MRSA infection nor to guide or monitor treatment for MRSA infections.   Culture, respiratory (NON-Expectorated)     Status: None   Collection Time: 11/03/16  2:15 PM  Result Value Ref Range Status   Specimen Description TRACHEAL ASPIRATE  Final     Special Requests NONE  Final   Gram Stain   Final    MODERATE WBC PRESENT,BOTH PMN AND MONONUCLEAR MODERATE GRAM NEGATIVE RODS MODERATE GRAM POSITIVE COCCI RARE GRAM POSITIVE RODS    Culture   Final    Consistent with normal respiratory flora. Performed at [facility], [address], [place], [place] [address]    Report Status 11/06/2016 FINAL  Final    RADIOLOGY:  Ct Head Wo Contrast  Result Date: 01/11/2017 CLINICAL DATA:  syncope. EXAM: CT HEAD WITHOUT CONTRAST TECHNIQUE: Contiguous axial images were obtained from the base of the skull through the vertex without intravenous contrast. COMPARISON:  11/01/2016 FINDINGS: Brain: No evidence of acute infarction, hemorrhage, hydrocephalus, extra-axial collection or mass lesion/mass effect. Vascular:  No hyperdense vessel or unexpected calcification. Skull: Normal. Negative for fracture or focal lesion. Sinuses/Orbits: No acute finding. Other: None. IMPRESSION: 1. Normal brain. Electronically Signed   By: Signa Kell M.D.   On: 01/11/2017 22:23     Management plans discussed with the patient, family and they are in agreement.  CODE STATUS: Full Code   TOTAL TIME TAKING CARE OF THIS PATIENT: 23 minutes.    Shaune Pollack M.D on 01/12/2017 at 12:05 PM  Between 7am to 6pm - Pager - [phone]  After 6pm go to [email or web address] - Scientist, research (life sciences) [facility]  Office  [phone]  CC: Primary care physician; [facility]   Note: This dictation was prepared with Nurse, children's dictation along with smaller phrase technology. Any transcriptional errors that result from this process are unintentional.

## 2017-01-12 NOTE — Progress Notes (Signed)
[facility] - [place] at [facility] Brett Washington was admitted to the Hospital on 01/11/2017 and Discharged  01/12/2017 and should be excused from work/school   for 2  days starting 01/11/2017 , may return to work/school without any restrictions.  Brett Washington M.D on 01/12/2017,at 10:25 AM  [facility] - [place] at [facility]  [phone]

## 2017-01-12 NOTE — Progress Notes (Signed)
Pt being discharged home, discharge instructions and prescriptions reviewed with pt, states understanding, pt with no complaints, refused wheelchair at discharge

## 2017-01-12 NOTE — Discharge Instructions (Signed)
Alcohol cessation gradually.

## 2017-01-12 NOTE — Plan of Care (Signed)
Problem: Education: Goal: Knowledge of [facility] Education information/materials will improve Outcome: Progressing VSS, free of falls during shift.  No sz activity.  CIWA 6, no ativan needed.  Denies pain.  Bed in low position, padded side rails.  Call bell within reach, WCTM.

## 2017-12-22 ENCOUNTER — Encounter: Payer: Self-pay | Admitting: Emergency Medicine

## 2017-12-22 ENCOUNTER — Emergency Department
Admission: EM | Admit: 2017-12-22 | Discharge: 2017-12-22 | Disposition: A | Discharge status: Home / Self Care | Payer: Self-pay | Attending: Emergency Medicine | Admitting: Emergency Medicine

## 2017-12-22 ENCOUNTER — Other Ambulatory Visit: Payer: Self-pay

## 2017-12-22 DIAGNOSIS — F10939 Alcohol use, unspecified with withdrawal, unspecified: Secondary | ICD-10-CM

## 2017-12-22 DIAGNOSIS — F10239 Alcohol dependence with withdrawal, unspecified: Secondary | ICD-10-CM | POA: Insufficient documentation

## 2017-12-22 DIAGNOSIS — Z79899 Other long term (current) drug therapy: Secondary | ICD-10-CM | POA: Insufficient documentation

## 2017-12-22 LAB — HEPATIC FUNCTION PANEL
ALBUMIN: 3.4 g/dL — AB (ref 3.5–5.0)
ALT: 74 U/L — ABNORMAL HIGH (ref 0–44)
AST: 187 U/L — ABNORMAL HIGH (ref 15–41)
Alkaline Phosphatase: 188 U/L — ABNORMAL HIGH (ref 38–126)
BILIRUBIN INDIRECT: 1.4 mg/dL — AB (ref 0.3–0.9)
BILIRUBIN TOTAL: 2.1 mg/dL — AB (ref 0.3–1.2)
Bilirubin, Direct: 0.7 mg/dL — ABNORMAL HIGH (ref 0.0–0.2)
Total Protein: 7.9 g/dL (ref 6.5–8.1)

## 2017-12-22 LAB — BASIC METABOLIC PANEL
Anion gap: 13 (ref 5–15)
CO2: 27 mmol/L (ref 22–32)
CREATININE: 0.41 mg/dL — AB (ref 0.61–1.24)
Calcium: 8.8 mg/dL — ABNORMAL LOW (ref 8.9–10.3)
Chloride: 95 mmol/L — ABNORMAL LOW (ref 98–111)
GFR calc Af Amer: >60 mL/min (ref >60)
Glucose, Bld: 98 mg/dL (ref 70–99)
Potassium: 3.3 mmol/L — ABNORMAL LOW (ref 3.5–5.1)
SODIUM: 135 mmol/L (ref 135–145)

## 2017-12-22 LAB — CBC
HCT: 39.7 % — ABNORMAL LOW (ref 40.0–52.0)
Hemoglobin: 13.9 g/dL (ref 13.0–18.0)
MCH: 34.4 pg — ABNORMAL HIGH (ref 26.0–34.0)
MCHC: 34.9 g/dL (ref 32.0–36.0)
MCV: 98.7 fL (ref 80.0–100.0)
PLATELETS: 293 10*3/uL (ref 150–440)
RBC: 4.03 MIL/uL — ABNORMAL LOW (ref 4.40–5.90)
RDW: 13.8 % (ref 11.5–14.5)
WBC: 11.6 10*3/uL — AB (ref 3.8–10.6)

## 2017-12-22 LAB — GLUCOSE, CAPILLARY: Glucose-Capillary: 92 mg/dL (ref 70–99)

## 2017-12-22 LAB — LIPASE, BLOOD: LIPASE: 23 U/L (ref 11–51)

## 2017-12-22 MED ORDER — SODIUM CHLORIDE 0.9 % IV BOLUS
1000.0000 mL | Freq: Once | INTRAVENOUS | Status: AC
  Administered 2017-12-22: 1000 mL via INTRAVENOUS

## 2017-12-22 MED ORDER — FOLIC ACID 1 MG PO TABS
1.0000 mg | ORAL_TABLET | Freq: Once | ORAL | Status: AC
  Administered 2017-12-22: 1 mg via ORAL
  Filled 2017-12-22: qty 1

## 2017-12-22 MED ORDER — VITAMIN B-1 100 MG PO TABS
100.0000 mg | ORAL_TABLET | Freq: Once | ORAL | Status: AC
  Administered 2017-12-22: 100 mg via ORAL
  Filled 2017-12-22: qty 1

## 2017-12-22 MED ORDER — CHLORDIAZEPOXIDE HCL 10 MG PO CAPS: ORAL_CAPSULE | ORAL | 0 refills | Status: DC

## 2017-12-22 MED ORDER — LORAZEPAM 2 MG PO TABS
2.0000 mg | ORAL_TABLET | Freq: Once | ORAL | Status: AC
  Administered 2017-12-22: 2 mg via ORAL
  Filled 2017-12-22: qty 1

## 2017-12-22 NOTE — ED Provider Notes (Signed)
[facility] Emergency Department Provider Note  Time seen: 12:53 PM  I have reviewed the triage vital signs and the nursing notes.   HISTORY  Chief Complaint Seizures    HPI Brett Washington is a 37 y.o. male with a past medical history of alcohol abuse, seizure disorder, liver dysfunction, presents to the emergency department for weakness nausea.  According to the patient he was told his liver was being affected by his drinking several weeks ago so he stopped drinking cold [place] 2 weeks ago.  States shortly after stopping drinking he had 2 seizures.  This morning he states he was feeling very shaky and weak somewhat nauseated and sweaty which is how he felt before he had each seizure 2 weeks ago.  He was concerned that he was going to have another seizure so he came to the emergency department for evaluation.  Did not actually have a seizure today.   Past Medical History:  Diagnosis Date  . Alcohol abuse   . Heart murmur   . Seizures [facility])     Patient Active Problem List   Diagnosis Date Noted  . LOC (loss of consciousness) (HCC) 01/11/2017  . Alcohol use disorder, severe, dependence (HCC) 11/06/2016  . Aspiration pneumonia due to vomit (HCC)   . Alcohol withdrawal seizure (HCC) 10/31/2016  . Thrombocytopenia (HCC) 10/31/2016  . Transaminitis 10/31/2016  . Hypokalemia 10/31/2016    Past Surgical History:  Procedure Laterality Date  . HAND SURGERY      Prior to Admission medications   Medication Sig Start Date End Date Taking? Authorizing Provider  folic acid (FOLVITE) 1 MG tablet Take 1 tablet (1 mg total) by mouth daily. 01/13/17   Shaune Pollack, MD  thiamine 100 MG tablet Take 1 tablet (100 mg total) by mouth daily. 01/13/17   Shaune Pollack, MD    No Known Allergies  Family History  Family history unknown: Yes    Social History Social History   Tobacco Use  . Smoking status: Never Smoker  . Smokeless tobacco: Never Used  Substance Use Topics   . Alcohol use: Yes    Alcohol/week: 56.0 standard drinks    Types: 56 Cans of beer per week  . Drug use: No    Review of Systems Constitutional: Negative for fever.  Positive for generalized fatigue and weakness Eyes: Negative for visual complaints ENT: Negative for recent illness/congestion Cardiovascular: Negative for chest pain. Respiratory: Negative for shortness of breath. Gastrointestinal: Negative for abdominal pain.  Positive for nausea. Genitourinary: Negative for urinary compaints Musculoskeletal: Negative for musculoskeletal complaints Skin: Positive for diaphoresis today. Neurological: Negative for headache All other ROS negative  ____________________________________________   PHYSICAL EXAM:  VITAL SIGNS: ED Triage Vitals  Enc Vitals Group     BP 12/22/17 1227 (!) 137/92     Pulse Rate 12/22/17 1227 94     Resp 12/22/17 1227 16     Temp 12/22/17 1227 99.3 F (37.4 C)     Temp Source 12/22/17 1227 Oral     SpO2 12/22/17 1227 99 %     Weight 12/22/17 1228 172 lb (78 kg)     Height 12/22/17 1228 5\' 8"  (1.727 m)     Head Circumference --      Peak Flow --      Pain Score 12/22/17 1228 0     Pain Loc --      Pain Edu? --      Excl. in GC? --  Constitutional: Alert and oriented. Well appearing and in no distress. Eyes: Normal exam ENT   Head: Normocephalic and atraumatic   Mouth/Throat: Mucous membranes are moist. Cardiovascular: Normal rate, regular rhythm. No murmur Respiratory: Normal respiratory effort without tachypnea nor retractions. Breath sounds are clear Gastrointestinal: Soft and nontender. No distention.   Musculoskeletal: Nontender with normal range of motion in all extremities. Neurologic:  Normal speech and language. No gross focal neurologic deficits  Skin:  Skin is warm, dry and intact.  Psychiatric: Mood and affect are normal.   ____________________________________________   INITIAL IMPRESSION / ASSESSMENT AND PLAN / ED  COURSE  Pertinent labs & imaging results that were available during my care of the patient were reviewed by me and considered in my medical decision making (see chart for details).  Patient presents to the emergency department with weakness and nausea.  Patient stopped drinking alcohol abruptly 2 weeks ago shortly afterward had 2 seizures.  Today he was feeling like he was going to have another seizure felt very shaky weak and nauseated.  Differential would include alcohol withdrawal, seizure aura, metabolic or electrolyte abnormality.  We will check labs, IV hydrate.  We will dose 2 mg of oral Ativan, thiamine and folate.  We will continue to closely monitor while awaiting lab results.  Patient's work-up is largely at baseline, elevated LFTs however largely at baseline.  No significant abnormality.  Patient feels much better after Ativan.  We will discharge on a Librium taper, have the patient follow-up with his doctor.  Patient agreeable to this plan of care. ____________________________________________   FINAL CLINICAL IMPRESSION(S) / ED DIAGNOSES  Nausea Weakness    Minna Antis, MD 12/22/17 1447

## 2017-12-22 NOTE — ED Notes (Addendum)
Pt reports that one and a half years ago he had a seizure and was not placed on medication because they told him that it was alcohol related - pt admits to drinking every day at least 8 beers every day - he reports that he stopped drinking 2 weeks ago and that since then he has had 3 seizures (pt reports to MD that he only felt like he was going to have seizure today but did not)

## 2017-12-22 NOTE — ED Triage Notes (Signed)
Pt states he had a seizure this am, was seen here 6 months ago for the same, does not take any medications for seizure. States he can feel the seizure coming on, he had 2 seizures 2 weeks ago as well. The past few days, hasn't been feeling well, hasn't been eating due to nausea. States he feels "that feeling" coming back.

## 2017-12-22 NOTE — ED Notes (Signed)
Pt assisted to toilet to void 

## 2018-06-15 ENCOUNTER — Encounter: Payer: Self-pay | Admitting: Emergency Medicine

## 2018-06-15 ENCOUNTER — Other Ambulatory Visit: Payer: Self-pay

## 2018-06-15 ENCOUNTER — Emergency Department
Admission: EM | Admit: 2018-06-15 | Discharge: 2018-06-15 | Disposition: A | Discharge status: Home / Self Care | Payer: Self-pay | Attending: Emergency Medicine | Admitting: Emergency Medicine

## 2018-06-15 DIAGNOSIS — F1020 Alcohol dependence, uncomplicated: Secondary | ICD-10-CM | POA: Insufficient documentation

## 2018-06-15 LAB — URINE DRUG SCREEN, QUALITATIVE (ARMC ONLY)
Amphetamines, Ur Screen: NOT DETECTED
BARBITURATES, UR SCREEN: NOT DETECTED
BENZODIAZEPINE, UR SCRN: NOT DETECTED
Cannabinoid 50 Ng, Ur ~~LOC~~: NOT DETECTED
Cocaine Metabolite,Ur ~~LOC~~: NOT DETECTED
MDMA (Ecstasy)Ur Screen: NOT DETECTED
Methadone Scn, Ur: NOT DETECTED
Opiate, Ur Screen: NOT DETECTED
PHENCYCLIDINE (PCP) UR S: NOT DETECTED
TRICYCLIC, UR SCREEN: NOT DETECTED

## 2018-06-15 LAB — COMPREHENSIVE METABOLIC PANEL
ALBUMIN: 4.4 g/dL (ref 3.5–5.0)
ALK PHOS: 85 U/L (ref 38–126)
ALT: 46 U/L — ABNORMAL HIGH (ref 0–44)
ANION GAP: 16 — AB (ref 5–15)
AST: 111 U/L — AB (ref 15–41)
BILIRUBIN TOTAL: 1.2 mg/dL (ref 0.3–1.2)
CALCIUM: 8.8 mg/dL — AB (ref 8.9–10.3)
CO2: 20 mmol/L — ABNORMAL LOW (ref 22–32)
Chloride: 105 mmol/L (ref 98–111)
Creatinine, Ser: 0.45 mg/dL — ABNORMAL LOW (ref 0.61–1.24)
GFR calc Af Amer: >60 mL/min (ref >60)
GLUCOSE: 91 mg/dL (ref 70–99)
POTASSIUM: 3.8 mmol/L (ref 3.5–5.1)
Sodium: 141 mmol/L (ref 135–145)
TOTAL PROTEIN: 8.1 g/dL (ref 6.5–8.1)

## 2018-06-15 LAB — CBC
HCT: 44.8 % (ref 39.0–52.0)
Hemoglobin: 14.7 g/dL (ref 13.0–17.0)
MCH: 31.9 pg (ref 26.0–34.0)
MCHC: 32.8 g/dL (ref 30.0–36.0)
MCV: 97.2 fL (ref 80.0–100.0)
PLATELETS: 147 10*3/uL — AB (ref 150–400)
RBC: 4.61 MIL/uL (ref 4.22–5.81)
RDW: 13.7 % (ref 11.5–15.5)
WBC: 5.3 10*3/uL (ref 4.0–10.5)
nRBC: 0 % (ref 0.0–0.2)

## 2018-06-15 LAB — ETHANOL: ALCOHOL ETHYL (B): 418 mg/dL — AB (ref <10)

## 2018-06-15 MED ORDER — SODIUM CHLORIDE 0.9 % IV SOLN
Freq: Once | INTRAVENOUS | Status: AC
  Administered 2018-06-15: 17:00:00 via INTRAVENOUS

## 2018-06-15 NOTE — ED Provider Notes (Signed)
[facility] Emergency Department Provider Note       Time seen: ----------------------------------------- 3:47 PM on 06/15/2018 -----------------------------------------   I have reviewed the triage vital signs and the nursing notes.  HISTORY   Chief Complaint No chief complaint on file.    HPI Brett Washington is a 38 y.o. male with a history of alcohol abuse, seizures, heart murmur who presents to the ED requesting help with an alcohol problem.  Patient states he drinks every day and last drink was approximately 2 days ago.  He denies suicidal or homicidal ideation.  He also denies drug abuse.  He was brought by police from a store where he was passed out from alcohol.  Past Medical History:  Diagnosis Date  . Alcohol abuse   . Heart murmur   . Seizures [facility])     Patient Active Problem List   Diagnosis Date Noted  . LOC (loss of consciousness) (HCC) 01/11/2017  . Alcohol use disorder, severe, dependence (HCC) 11/06/2016  . Aspiration pneumonia due to vomit (HCC)   . Alcohol withdrawal seizure (HCC) 10/31/2016  . Thrombocytopenia (HCC) 10/31/2016  . Transaminitis 10/31/2016  . Hypokalemia 10/31/2016    Past Surgical History:  Procedure Laterality Date  . HAND SURGERY      Allergies Patient has no known allergies.  Social History Social History   Tobacco Use  . Smoking status: Never Smoker  . Smokeless tobacco: Never Used  Substance Use Topics  . Alcohol use: Yes    Alcohol/week: 56.0 standard drinks    Types: 56 Cans of beer per week  . Drug use: No   Review of Systems Constitutional: Negative for fever. Cardiovascular: Negative for chest pain. Respiratory: Negative for shortness of breath. Gastrointestinal: Negative for abdominal pain, vomiting and diarrhea. Musculoskeletal: Negative for back pain. Skin: Negative for rash. Neurological: Negative for headaches  All systems negative/normal/unremarkable except as stated in the  HPI  ____________________________________________   PHYSICAL EXAM:  VITAL SIGNS: ED Triage Vitals [06/15/18 1459]  Enc Vitals Group     BP 131/80     Pulse Rate (!) 105     Resp 20     Temp 97.9 F (36.6 C)     Temp Source Oral     SpO2 100 %     Weight      Height      Head Circumference      Peak Flow      Pain Score 0     Pain Loc      Pain Edu?      Excl. in GC?     Constitutional: Somewhat lethargic but arouses easily and converses Eyes: Conjunctivae are normal. Normal extraocular movements. ENT      Head: Normocephalic and atraumatic.      Nose: No congestion/rhinnorhea.      Mouth/Throat: Mucous membranes are moist.      Neck: No stridor. Cardiovascular: Rapid rate, regular rhythm.  Harsh systolic murmur is noted. Respiratory: Normal respiratory effort without tachypnea nor retractions. Breath sounds are clear and equal bilaterally. No wheezes/rales/rhonchi. Gastrointestinal: Soft and nontender. Normal bowel sounds Musculoskeletal: Nontender with normal range of motion in extremities.  Neurologic: Slurred speech, no gross focal neurologic deficits are appreciated.  Skin:  Skin is warm, dry and intact. No rash noted. Psychiatric: Mood and affect are normal.  ____________________________________________  ED COURSE:  As part of my medical decision making, I reviewed the following data within the electronic MEDICAL RECORD NUMBER History obtained from  family if available, nursing notes, old chart and ekg, as well as notes from prior ED visits. Patient presented for a detox request, we will assess with labs and reevaluate. Clinical Course as of Jun 14 1833  Mon Jun 15, 2018  1741 Alcohol, Ethyl (B)(!!): 418 [JW]    Clinical Course User Index [JW] Emily Filbert, MD   Procedures ____________________________________________   LABS (pertinent positives/negatives)  Labs Reviewed  COMPREHENSIVE METABOLIC PANEL - Abnormal; Notable for the following components:       Result Value   CO2 20 (*)    BUN <5 (*)    Creatinine, Ser 0.45 (*)    Calcium 8.8 (*)    AST 111 (*)    ALT 46 (*)    Anion gap 16 (*)    All other components within normal limits  ETHANOL - Abnormal; Notable for the following components:   Alcohol, Ethyl (B) 418 (*)    All other components within normal limits  CBC - Abnormal; Notable for the following components:   Platelets 147 (*)    All other components within normal limits  URINE DRUG SCREEN, QUALITATIVE ([facility] ONLY)   ____________________________________________   DIFFERENTIAL DIAGNOSIS   Alcohol use disorder, dehydration, electrolyte abnormality, heart murmur  FINAL ASSESSMENT AND PLAN  Alcohol use disorder   Plan: The patient had presented for alcohol intoxication. Patient's labs do indicate significant alcohol intoxication but he appears awake and alert and does not want any further treatment.  Patient states he will call for family to take him home.   [name], MD    Note: This note was generated in part or whole with voice recognition software. Voice recognition is usually quite accurate but there are transcription errors that can and very often do occur. I apologize for any typographical errors that were not detected and corrected.     Emily Filbert, MD 06/15/18 [phone]

## 2018-06-15 NOTE — ED Notes (Signed)
Pt now stating, "I want to leave.  I want to go home and be with my family and kids."  Dr.  Mayford Knife notified.

## 2018-06-15 NOTE — ED Notes (Signed)
Pt denies SI, HI, and A/V hallucinations.  Requesting DC.  Dr. Mayford Knife spoke with pt.  Discharge instructions given.  Pt discharged to lobby to await ride from wife.

## 2018-06-15 NOTE — ED Triage Notes (Signed)
PT via [employer], was called from a store where pt was "passed out". Per [employer] pt requesting eval in ED for ETOH problem. PT states he drinks every day and last drink was aprox 2days ago. PT also admits to AVH, denies any SI/HI or drug use. VSS

## 2018-06-15 NOTE — ED Notes (Signed)
2 Black socks Army greenish colored [place] tennis shoes Jeans Black tshirt  Black hat Tan belt Black underwear  Gold colored chain necklace with charm scripting "[email or web address]"  Clothes collected and dressed out by tech Arina Torry maglione

## 2018-09-20 IMAGING — CT CT HEAD W/O CM
3 series · 15 of 47 positions shown, 18 images · non-contrast
Comparison: 07/06/2013

CLINICAL DATA: Seizure activity

EXAM:
CT HEAD WITHOUT CONTRAST
TECHNIQUE: Contiguous axial images were obtained from the base of the skull
through the vertex without intravenous contrast.

[Series 3: head wo · axial · 0.42mm/px · z∈[-70,+55]mm · 9 of 31 slices shown, 12 images]
[im 3/31  brain]
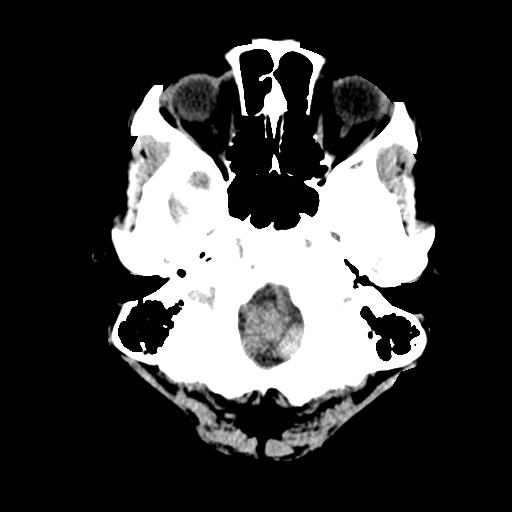
[im 3/31  bone]
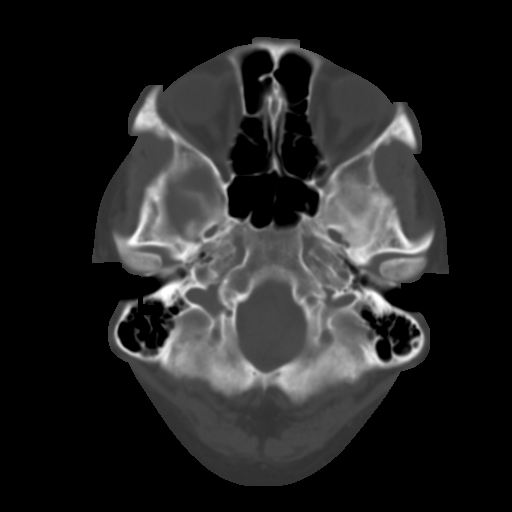
[im 6/31  brain]
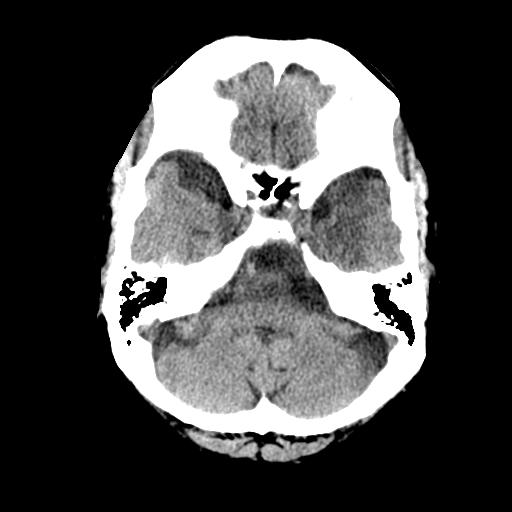
[im 9/31  brain]
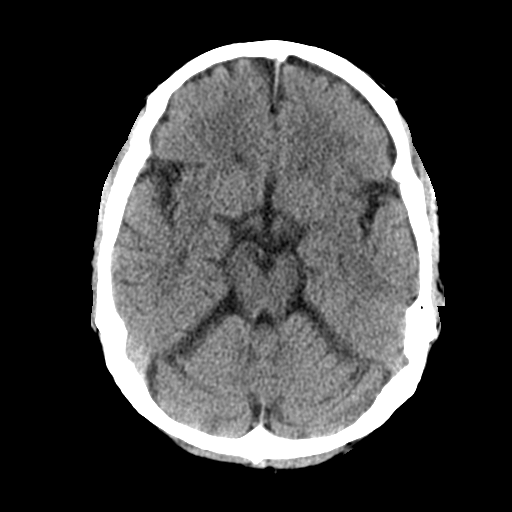
[im 12/31  brain]
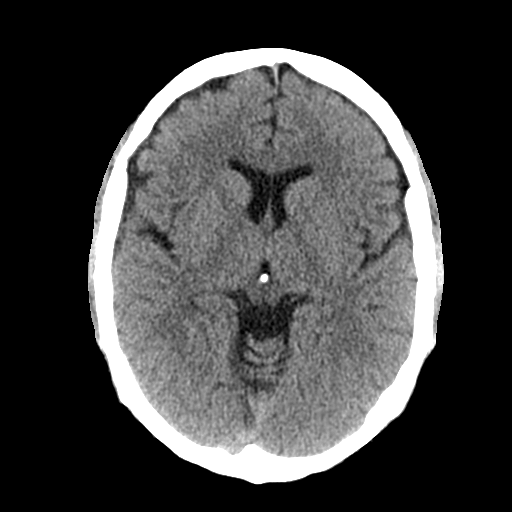
[im 16/31  brain]
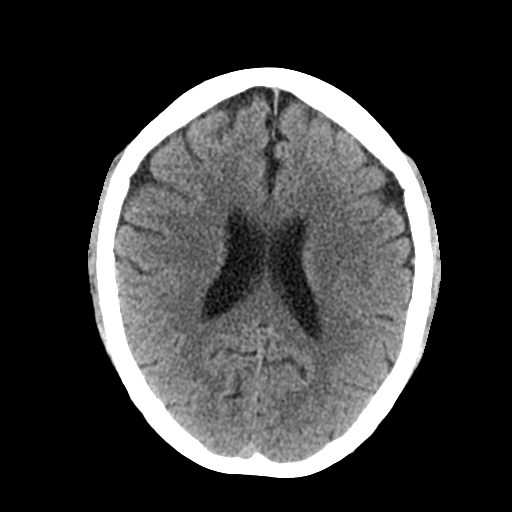
[im 16/31  bone]
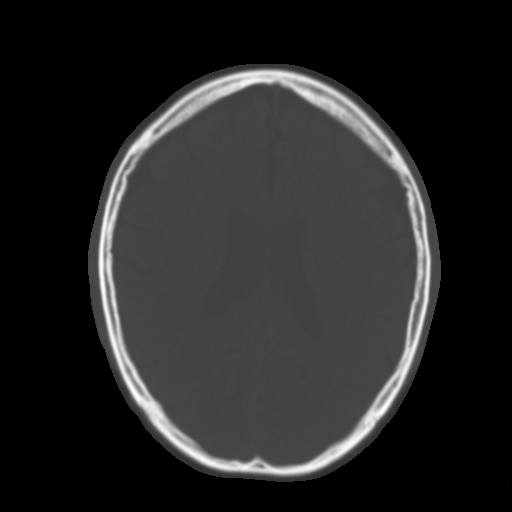
[im 19/31  brain]
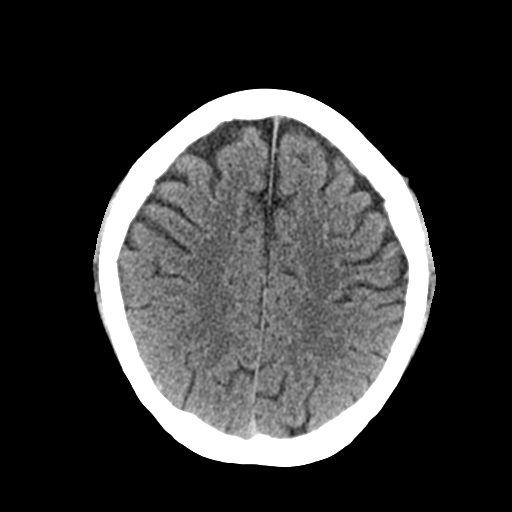
[im 22/31  brain]
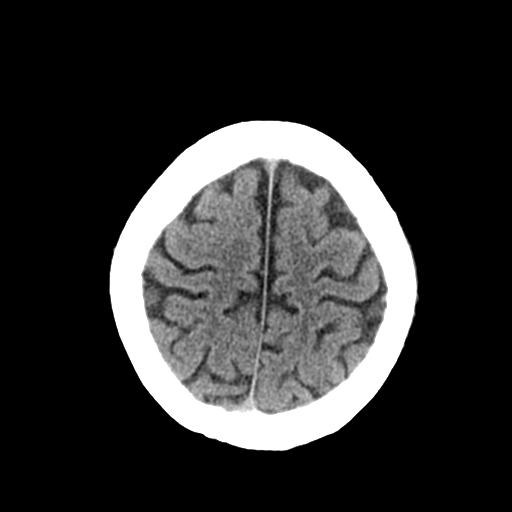
[im 25/31  brain]
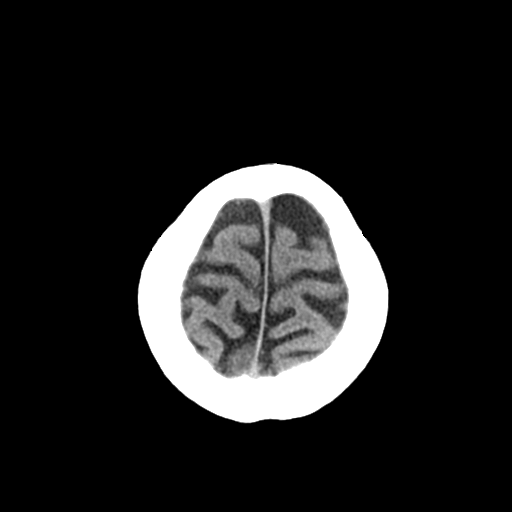
[im 28/31  brain]
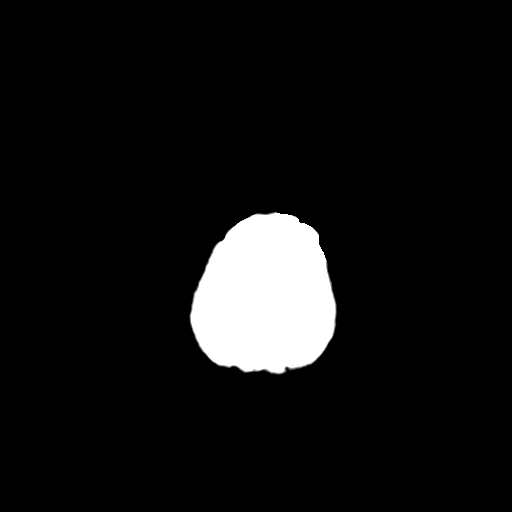
[im 28/31  bone]
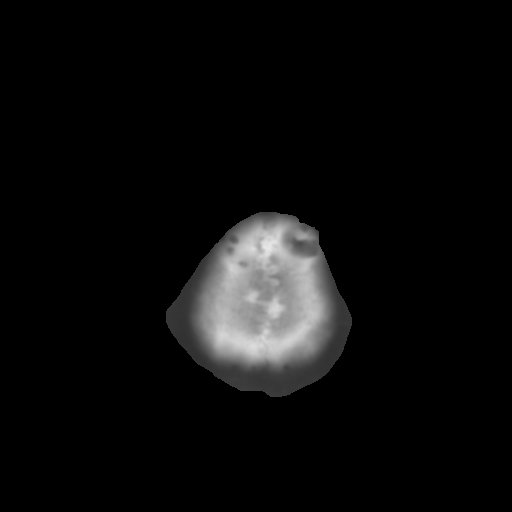

[Series 4: coronal soft tissue · coronal · 0.31mm/px · 3 of 62 slices shown]
[im 21/62  brain]
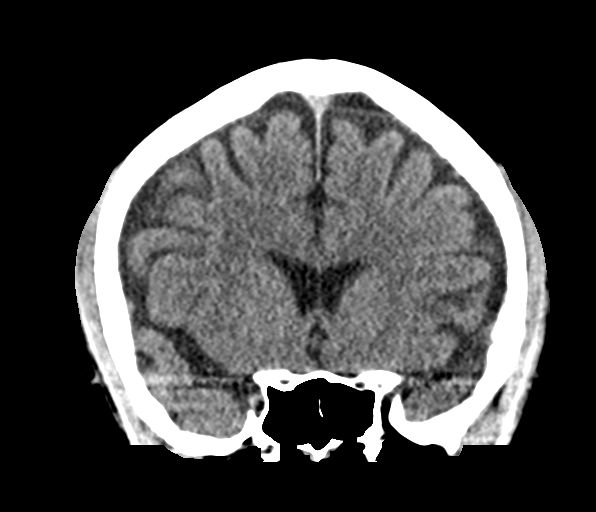
[im 28/62  brain]
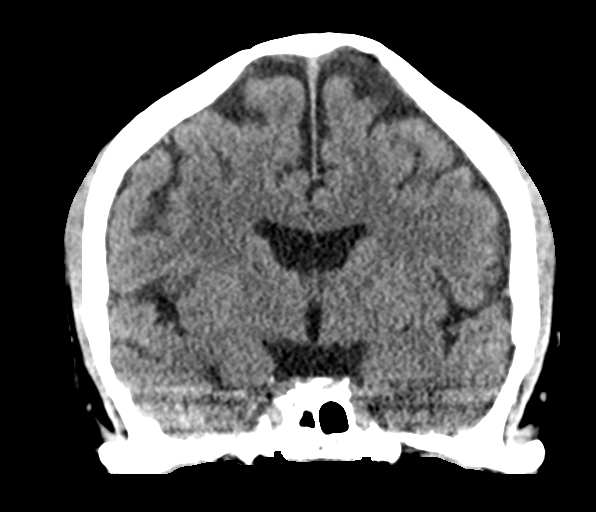
[im 34/62  brain]
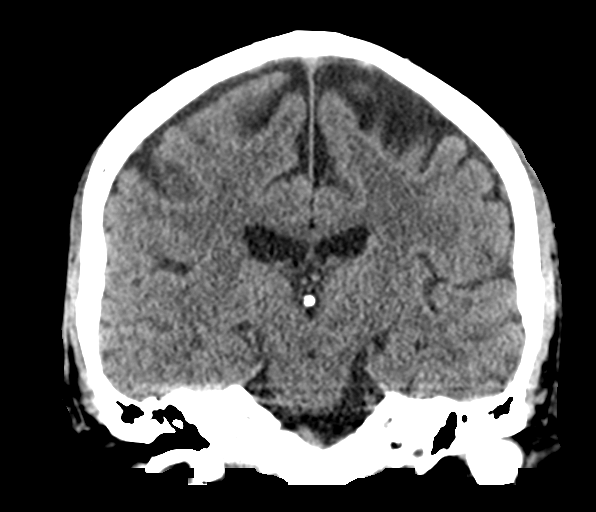

[Series 5: sagittal soft tissue · sagittal · 0.29mm/px · 3 of 57 slices shown]
[im 19/57  brain]
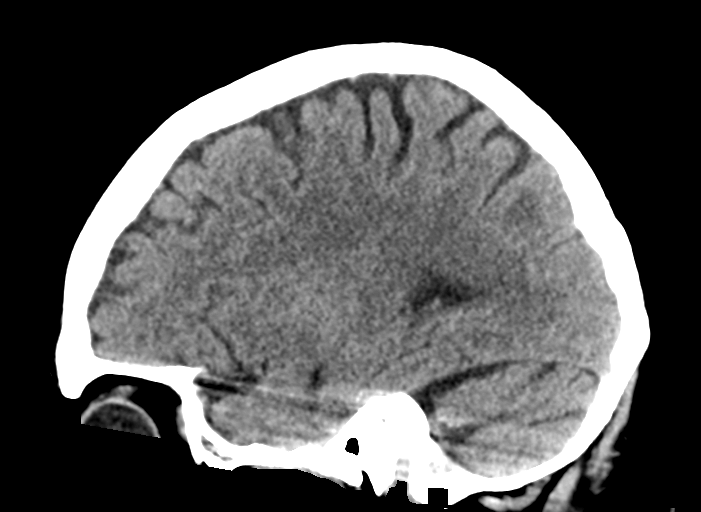
[im 29/57  brain]
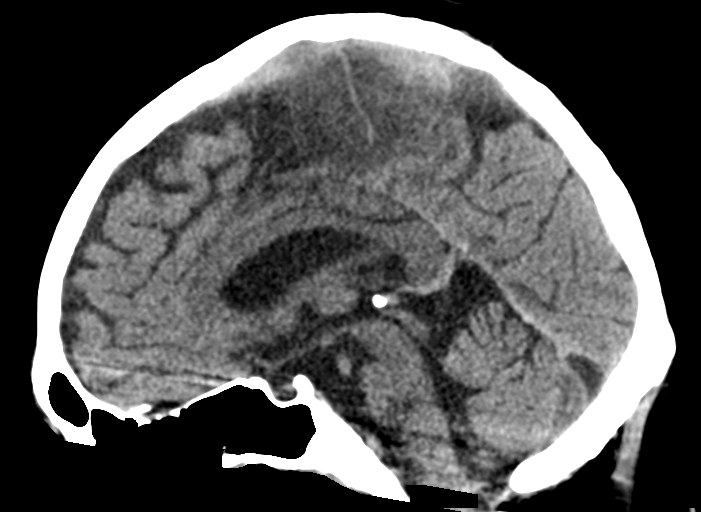
[im 38/57  brain]
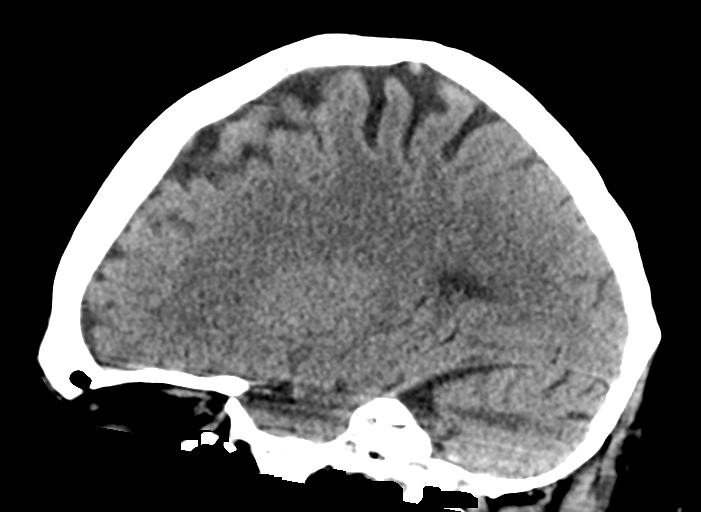

[15 of 47 positions shown; findings below may reference images not displayed]

FINDINGS: Brain: No evidence of acute infarction, hemorrhage, hydrocephalus,
extra-axial collection or mass lesion/mass effect.

Vascular: No hyperdense vessel or unexpected calcification.

Skull: Normal. Negative for fracture or focal lesion.

Sinuses/Orbits: No acute finding.

Other: None.
IMPRESSION: No acute intracranial abnormality noted.

## 2018-11-20 ENCOUNTER — Emergency Department
Admission: EM | Admit: 2018-11-20 | Discharge: 2018-11-20 | Disposition: A | Discharge status: Home / Self Care | Payer: Self-pay | Attending: Emergency Medicine | Admitting: Emergency Medicine

## 2018-11-20 ENCOUNTER — Encounter: Payer: Self-pay | Admitting: Emergency Medicine

## 2018-11-20 ENCOUNTER — Other Ambulatory Visit: Payer: Self-pay

## 2018-11-20 DIAGNOSIS — Y908 Blood alcohol level of 240 mg/100 ml or more: Secondary | ICD-10-CM | POA: Insufficient documentation

## 2018-11-20 DIAGNOSIS — K7010 Alcoholic hepatitis without ascites: Secondary | ICD-10-CM | POA: Insufficient documentation

## 2018-11-20 DIAGNOSIS — F10220 Alcohol dependence with intoxication, uncomplicated: Secondary | ICD-10-CM | POA: Insufficient documentation

## 2018-11-20 LAB — CBC WITH DIFFERENTIAL/PLATELET
Abs Immature Granulocytes: 0.01 10*3/uL (ref 0.00–0.07)
Basophils Absolute: 0.1 10*3/uL (ref 0.0–0.1)
Basophils Relative: 2 %
Eosinophils Absolute: 0 10*3/uL (ref 0.0–0.5)
Eosinophils Relative: 0 %
HCT: 40.7 % (ref 39.0–52.0)
Hemoglobin: 14.2 g/dL (ref 13.0–17.0)
Immature Granulocytes: 0 %
Lymphocytes Relative: 21 %
Lymphs Abs: 1.1 10*3/uL (ref 0.7–4.0)
MCH: 31.6 pg (ref 26.0–34.0)
MCHC: 34.9 g/dL (ref 30.0–36.0)
MCV: 90.6 fL (ref 80.0–100.0)
Monocytes Absolute: 0.5 10*3/uL (ref 0.1–1.0)
Monocytes Relative: 10 %
Neutro Abs: 3.8 10*3/uL (ref 1.7–7.7)
Neutrophils Relative %: 67 %
Platelets: 95 10*3/uL — ABNORMAL LOW (ref 150–400)
RBC: 4.49 MIL/uL (ref 4.22–5.81)
RDW: 13.9 % (ref 11.5–15.5)
WBC: 5.5 10*3/uL (ref 4.0–10.5)
nRBC: 0 % (ref 0.0–0.2)

## 2018-11-20 LAB — COMPREHENSIVE METABOLIC PANEL
ALT: 100 U/L — ABNORMAL HIGH (ref 0–44)
AST: 200 U/L — ABNORMAL HIGH (ref 15–41)
Albumin: 4.4 g/dL (ref 3.5–5.0)
Alkaline Phosphatase: 111 U/L (ref 38–126)
Anion gap: 14 (ref 5–15)
BUN: <5 mg/dL — ABNORMAL LOW (ref 6–20)
CO2: 22 mmol/L (ref 22–32)
Calcium: 9.4 mg/dL (ref 8.9–10.3)
Chloride: 99 mmol/L (ref 98–111)
Creatinine, Ser: 0.46 mg/dL — ABNORMAL LOW (ref 0.61–1.24)
GFR calc Af Amer: >60 mL/min (ref >60)
GFR calc non Af Amer: >60 mL/min (ref >60)
Glucose, Bld: 134 mg/dL — ABNORMAL HIGH (ref 70–99)
Potassium: 3.5 mmol/L (ref 3.5–5.1)
Sodium: 135 mmol/L (ref 135–145)
Total Bilirubin: 2.1 mg/dL — ABNORMAL HIGH (ref 0.3–1.2)
Total Protein: 8.7 g/dL — ABNORMAL HIGH (ref 6.5–8.1)

## 2018-11-20 LAB — ETHANOL: Alcohol, Ethyl (B): 466 mg/dL (ref <10)

## 2018-11-20 NOTE — ED Notes (Signed)
Date and time results received: 11/20/18 2:09 PM    Test: ETOH Critical Value: 466  Name of Provider Notified: Joni Fears, MD

## 2018-11-20 NOTE — ED Triage Notes (Signed)
Pt to ED via [facility] from home for detox, per EMS pt has been binge drinking x 4 days and now wants detox. Pt reports that he has not had a drink in 4 days and that he has had 3 seizures. Pt is tachycardic and very anxious in triage. Pt reports feeling as if he is going to have a seizure.

## 2018-11-20 NOTE — ED Provider Notes (Signed)
[facility] Emergency Department Provider Note  ____________________________________________  Time seen: Approximately 2:54 PM  I have reviewed the triage vital signs and the nursing notes.   HISTORY  Chief Complaint detox  Communicates effectively in English  HPI Brett FinerJose Stifter is a 38 y.o. male with a history of alcohol abuse who comes the ED  requesting help with alcohol detox.  He reports that he drinks maybe 840 ounce beers a day.  He drinks daily.  He has a seizure when he does not drink.  His last drink was just before coming to the emergency department.  He also reports that he has not been eating.  No SI HI or hallucinations.  No formication, abdominal pain vomiting black or bloody stool or hematemesis.  No history of seizure disorder.  Only seizes not drinking.     Past Medical History:  Diagnosis Date  . Alcohol abuse   . Heart murmur   . Seizures [facility](HCC)      Patient Active Problem List   Diagnosis Date Noted  . LOC (loss of consciousness) (HCC) 01/11/2017  . Alcohol use disorder, severe, dependence (HCC) 11/06/2016  . Aspiration pneumonia due to vomit (HCC)   . Alcohol withdrawal seizure (HCC) 10/31/2016  . Thrombocytopenia (HCC) 10/31/2016  . Transaminitis 10/31/2016  . Hypokalemia 10/31/2016     Past Surgical History:  Procedure Laterality Date  . HAND SURGERY       Prior to Admission medications   Medication Sig Start Date End Date Taking? Authorizing Provider  chlordiazePOXIDE (LIBRIUM) 10 MG capsule Day 1-2: Take 1 tablet PO Q6H Day 3-4: Take 1 tablet PO Q8H Day 5-6: Take 1 tablet PO Q12H Day 7-8: Take 1 tablet Daily. 12/22/17   Minna AntisPaduchowski, Kevin, MD  folic acid (FOLVITE) 1 MG tablet Take 1 tablet (1 mg total) by mouth daily. Patient not taking: Reported on 12/22/2017 01/13/17   Shaune Pollackhen, Qing, MD  thiamine 100 MG tablet Take 1 tablet (100 mg total) by mouth daily. Patient not taking: Reported on 12/22/2017 01/13/17   Shaune Pollackhen, Qing, MD      Allergies Patient has no known allergies.   Family History  Family history unknown: Yes    Social History Social History   Tobacco Use  . Smoking status: Never Smoker  . Smokeless tobacco: Never Used  Substance Use Topics  . Alcohol use: Yes    Alcohol/week: 56.0 standard drinks    Types: 56 Cans of beer per week  . Drug use: No    Review of Systems  Constitutional:   No fever or chills.  ENT:   No sore throat. No rhinorrhea. Cardiovascular:   No chest pain or syncope. Respiratory:   No dyspnea or cough. Gastrointestinal:   Negative for abdominal pain, vomiting and diarrhea.  Musculoskeletal:   Negative for focal pain or swelling All other systems reviewed and are negative except as documented above in ROS and HPI.  ____________________________________________   PHYSICAL EXAM:  VITAL SIGNS: ED Triage Vitals  Enc Vitals Group     BP 11/20/18 1322 (!) 135/95     Pulse Rate 11/20/18 1322 (!) 101     Resp 11/20/18 1322 18     Temp 11/20/18 1322 99.1 F (37.3 C)     Temp Source 11/20/18 1322 Oral     SpO2 11/20/18 1322 97 %     Weight 11/20/18 1333 195 lb (88.5 kg)     Height 11/20/18 1333 5\' 8"  (1.727 m)  Head Circumference --      Peak Flow --      Pain Score 11/20/18 1333 10     Pain Loc --      Pain Edu? --      Excl. in GC? --     Vital signs reviewed, nursing assessments reviewed.   Constitutional:   Alert and oriented. Non-toxic appearance. Eyes:   Conjunctivae are normal. EOMI. PERRL. ENT      Head:   Normocephalic and atraumatic.      Nose:   No congestion/rhinnorhea.       Mouth/Throat:   MMM, no pharyngeal erythema. No peritonsillar mass.       Neck:   No meningismus. Full ROM. Hematological/Lymphatic/Immunilogical:   No cervical lymphadenopathy. Cardiovascular:   RRR. Symmetric bilateral radial and DP pulses.  No murmurs. Cap refill less than 2 seconds. Respiratory:   Normal respiratory effort without tachypnea/retractions. Breath  sounds are clear and equal bilaterally. No wheezes/rales/rhonchi. Gastrointestinal:   Soft and nontender. Non distended. There is no CVA tenderness.  No rebound, rigidity, or guarding. Genitourinary:   deferred Musculoskeletal:   Normal range of motion in all extremities. No joint effusions.  No lower extremity tenderness.  No edema. Neurologic:   Slightly slurred speech, normal language.  Motor grossly intact.  Steady gait No acute focal neurologic deficits are appreciated.  Skin:    Skin is warm, dry and intact. No rash noted.  No petechiae, purpura, or bullae.  ____________________________________________    LABS (pertinent positives/negatives) (all labs ordered are listed, but only abnormal results are displayed) Labs Reviewed  ETHANOL - Abnormal; Notable for the following components:      Result Value   Alcohol, Ethyl (B) 466 (*)    All other components within normal limits  CBC WITH DIFFERENTIAL/PLATELET - Abnormal; Notable for the following components:   Platelets 95 (*)    All other components within normal limits  COMPREHENSIVE METABOLIC PANEL - Abnormal; Notable for the following components:   Glucose, Bld 134 (*)    BUN <5 (*)    Creatinine, Ser 0.46 (*)    Total Protein 8.7 (*)    AST 200 (*)    ALT 100 (*)    Total Bilirubin 2.1 (*)    All other components within normal limits   ____________________________________________   EKG    ____________________________________________    RADIOLOGY  No results found.  ____________________________________________   PROCEDURES Procedures  ____________________________________________    CLINICAL IMPRESSION / ASSESSMENT AND PLAN / ED COURSE  Medications ordered in the ED: Medications - No data to display  Pertinent labs & imaging results that were available during my care of the patient were reviewed by me and considered in my medical decision making (see chart for details).  Brett Washington was evaluated  in Emergency Department on 11/20/2018 for the symptoms described in the history of present illness. He was evaluated in the context of the global COVID-19 pandemic, which necessitated consideration that the patient might be at risk for infection with the SARS-CoV-2 virus that causes COVID-19. Institutional protocols and algorithms that pertain to the evaluation of patients at risk for COVID-19 are in a state of rapid change based on information released by regulatory bodies including the CDC and federal and state organizations. These policies and algorithms were followed during the patient's care in the ED.   Patient presents requesting detox due to alcohol abuse and dependency.  Has slightly slurred speech but otherwise not seriously intoxicated despite  an alcohol level of 460.  He is ambulatory with steady gait, lucid and interactive.  He wants to go home immediately with his mom who is here to pick him up.  TTS is providing her detox information and resources.  He is stable for outpatient follow-up.  I have advised him that attempting to stop drinking cold [place] will likely result in a severe withdrawal syndrome and he should come back to the emergency department as needed.  If he is intent on cutting back at home he should do so gradually by counting out his beers each day.  He understands this, understands he can return to the emergency department as needed.  Recommended he follow-up with detox program as soon as possible.  GI referral information for alcoholic hepatitis.      ____________________________________________   FINAL CLINICAL IMPRESSION(S) / ED DIAGNOSES    Final diagnoses:  Alcohol dependence with uncomplicated intoxication ([place])  Alcoholic hepatitis without ascites     ED Discharge Orders    None      Portions of this note were generated with dragon dictation software. Dictation errors may occur despite best attempts at proofreading.   Carrie Mew, MD 11/20/18  1459

## 2018-11-20 NOTE — ED Triage Notes (Signed)
EMS Report:  Patient has been binge drinking x 4 days and now wants to detox.  Vitals are stable.

## 2018-11-20 NOTE — ED Notes (Signed)
Pt now admits to drinking 2 40oz beers today.  States no drink in the 3 days prior to that.  Pt hyperventilating, but slows breathing when answering questions.

## 2018-11-20 NOTE — ED Notes (Signed)
Pt walking out of room at this time, states he wants to go home and be with his wife.  Pt agrees to wait and be seen by MD, Dr. Joni Fears aware of same.  Pt assisted to call wife to talk with her.  Pt encouraged to stay in bed and wait for staff to assist him.

## 2018-11-20 NOTE — BH Assessment (Signed)
Per the request of ER MD (Dr. Joni Fears) [place] spoke with patient about his options for alcohol detox. While talking, patient's nurse entering the room and was informed Probation officer, patient had requested to be discharge. Patient confirmed it was true. "My mom is here to pick me up..."   TTS is no longer needed.

## 2018-11-23 ENCOUNTER — Telehealth: Payer: Self-pay | Admitting: Gastroenterology

## 2018-11-23 NOTE — Telephone Encounter (Signed)
Spoke with pt wife to schedule apt ed f/u with Dr. Marius Ditch Pt wife will contact pt and call us back

## 2018-11-30 ENCOUNTER — Telehealth: Payer: Self-pay | Admitting: Gastroenterology

## 2018-11-30 ENCOUNTER — Encounter: Payer: Self-pay | Admitting: Gastroenterology

## 2018-11-30 NOTE — Telephone Encounter (Signed)
Unable to contact pt to schedule Ed f/u . Letter sent

## 2018-12-10 ENCOUNTER — Other Ambulatory Visit: Payer: Self-pay

## 2018-12-10 ENCOUNTER — Emergency Department
Admission: EM | Admit: 2018-12-10 | Discharge: 2018-12-10 | Disposition: A | Discharge status: Home / Self Care | Payer: Self-pay | Attending: Student | Admitting: Student

## 2018-12-10 DIAGNOSIS — Z79899 Other long term (current) drug therapy: Secondary | ICD-10-CM | POA: Insufficient documentation

## 2018-12-10 DIAGNOSIS — Y908 Blood alcohol level of 240 mg/100 ml or more: Secondary | ICD-10-CM | POA: Insufficient documentation

## 2018-12-10 DIAGNOSIS — F1010 Alcohol abuse, uncomplicated: Secondary | ICD-10-CM | POA: Insufficient documentation

## 2018-12-10 DIAGNOSIS — Z0289 Encounter for other administrative examinations: Secondary | ICD-10-CM | POA: Insufficient documentation

## 2018-12-10 LAB — COMPREHENSIVE METABOLIC PANEL
ALT: 111 U/L — ABNORMAL HIGH (ref 0–44)
AST: 243 U/L — ABNORMAL HIGH (ref 15–41)
Albumin: 3.7 g/dL (ref 3.5–5.0)
Alkaline Phosphatase: 153 U/L — ABNORMAL HIGH (ref 38–126)
Anion gap: 9 (ref 5–15)
BUN: 5 mg/dL — ABNORMAL LOW (ref 6–20)
CO2: 25 mmol/L (ref 22–32)
Calcium: 8.7 mg/dL — ABNORMAL LOW (ref 8.9–10.3)
Chloride: 109 mmol/L (ref 98–111)
Creatinine, Ser: 0.48 mg/dL — ABNORMAL LOW (ref 0.61–1.24)
GFR calc Af Amer: >60 mL/min (ref >60)
GFR calc non Af Amer: >60 mL/min (ref >60)
Glucose, Bld: 107 mg/dL — ABNORMAL HIGH (ref 70–99)
Potassium: 3.2 mmol/L — ABNORMAL LOW (ref 3.5–5.1)
Sodium: 143 mmol/L (ref 135–145)
Total Bilirubin: 1.4 mg/dL — ABNORMAL HIGH (ref 0.3–1.2)
Total Protein: 7.5 g/dL (ref 6.5–8.1)

## 2018-12-10 LAB — CBC
HCT: 39.4 % (ref 39.0–52.0)
Hemoglobin: 13.2 g/dL (ref 13.0–17.0)
MCH: 32.8 pg (ref 26.0–34.0)
MCHC: 33.5 g/dL (ref 30.0–36.0)
MCV: 98 fL (ref 80.0–100.0)
Platelets: 113 10*3/uL — ABNORMAL LOW (ref 150–400)
RBC: 4.02 MIL/uL — ABNORMAL LOW (ref 4.22–5.81)
RDW: 15.9 % — ABNORMAL HIGH (ref 11.5–15.5)
WBC: 4.4 10*3/uL (ref 4.0–10.5)
nRBC: 0 % (ref 0.0–0.2)

## 2018-12-10 LAB — URINE DRUG SCREEN, QUALITATIVE (ARMC ONLY)
Amphetamines, Ur Screen: NOT DETECTED
Barbiturates, Ur Screen: NOT DETECTED
Benzodiazepine, Ur Scrn: NOT DETECTED
Cannabinoid 50 Ng, Ur ~~LOC~~: NOT DETECTED
Cocaine Metabolite,Ur ~~LOC~~: NOT DETECTED
MDMA (Ecstasy)Ur Screen: NOT DETECTED
Methadone Scn, Ur: NOT DETECTED
Opiate, Ur Screen: NOT DETECTED
Phencyclidine (PCP) Ur S: NOT DETECTED
Tricyclic, Ur Screen: NOT DETECTED

## 2018-12-10 LAB — ETHANOL: Alcohol, Ethyl (B): 286 mg/dL — ABNORMAL HIGH (ref <10)

## 2018-12-10 MED ORDER — FOLIC ACID 1 MG PO TABS: 1.0000 mg | ORAL_TABLET | Freq: Once | ORAL | Status: DC

## 2018-12-10 MED ORDER — VITAMIN B-1 100 MG PO TABS: 100.0000 mg | ORAL_TABLET | Freq: Once | ORAL | Status: DC

## 2018-12-10 MED ORDER — ADULT MULTIVITAMIN W/MINERALS CH: 1.0000 | ORAL_TABLET | Freq: Once | ORAL | Status: DC

## 2018-12-10 NOTE — ED Notes (Signed)
Pt has stepped away from his bed.

## 2018-12-10 NOTE — ED Notes (Signed)
Pt drinks 6 beer daily, last drank 2 beer this AM. Denies SI or HI. Wants detox. Sent to ER from [place].

## 2018-12-10 NOTE — ED Provider Notes (Signed)
[facility] Emergency Department Provider Note  ____________________________________________   First MD Initiated Contact with Patient 12/10/18 1024     (approximate)  I have reviewed the triage vital signs and the nursing notes.  History  Chief Complaint Alcohol Problem    HPI Brett Washington is a 38 y.o. male with a history of alcohol use who presents to the emergency department for medical clearance so that he may seek care for alcohol detox.  Patient states he would like to get his life back together.  He states he drinks approximately 6 beers per day.  He has no complaints at this time.  He denies any suicidal ideation.         Past Medical Hx Past Medical History:  Diagnosis Date  . Alcohol abuse   . Heart murmur   . Seizures [facility])     Problem List Patient Active Problem List   Diagnosis Date Noted  . LOC (loss of consciousness) ([place]) 01/11/2017  . Alcohol use disorder, severe, dependence ([place]) 11/06/2016  . Aspiration pneumonia due to vomit ([place])   . Alcohol withdrawal seizure ([place]) 10/31/2016  . Thrombocytopenia ([place]) 10/31/2016  . Transaminitis 10/31/2016  . Hypokalemia 10/31/2016    Past Surgical Hx Past Surgical History:  Procedure Laterality Date  . HAND SURGERY      Medications Prior to Admission medications   Medication Sig Start Date End Date Taking? Authorizing Provider  chlordiazePOXIDE (LIBRIUM) 10 MG capsule Day 1-2: Take 1 tablet PO Q6H Day 3-4: Take 1 tablet PO Q8H Day 5-6: Take 1 tablet PO Q12H Day 7-8: Take 1 tablet Daily. 12/22/17   Harvest Dark, MD  folic acid (FOLVITE) 1 MG tablet Take 1 tablet (1 mg total) by mouth daily. Patient not taking: Reported on 12/22/2017 01/13/17   Demetrios Loll, MD  thiamine 100 MG tablet Take 1 tablet (100 mg total) by mouth daily. Patient not taking: Reported on 12/22/2017 01/13/17   Demetrios Loll, MD    Allergies Patient has no known allergies.  Family Hx Family History  Family  history unknown: Yes    Social Hx Social History   Tobacco Use  . Smoking status: Never Smoker  . Smokeless tobacco: Never Used  Substance Use Topics  . Alcohol use: Yes    Alcohol/week: 56.0 standard drinks    Types: 56 Cans of beer per week  . Drug use: No     Review of Systems  Constitutional: Negative for fever. Negative for chills. Eyes: Negative for visual changes. ENT: Negative for sore throat. Cardiovascular: Negative for chest pain. Respiratory: Negative for shortness of breath. Gastrointestinal: Negative for abdominal pain. Negative for nausea. Negative for vomiting. Genitourinary: Negative for dysuria. Musculoskeletal: Negative for leg swelling. Skin: Negative for rash. Neurological: Negative for for headaches.   Physical Exam  Vital Signs: ED Triage Vitals  Enc Vitals Group     BP 12/10/18 0945 135/77     Pulse Rate 12/10/18 0945 100     Resp 12/10/18 0945 18     Temp 12/10/18 0945 99.2 F (37.3 C)     Temp Source 12/10/18 0945 Oral     SpO2 12/10/18 0945 97 %     Weight 12/10/18 0944 195 lb (88.5 kg)     Height 12/10/18 0944 5\' 8"  (1.727 m)     Head Circumference --      Peak Flow --      Pain Score 12/10/18 0944 0     Pain Loc --  Pain Edu? --      Excl. in GC? --     Constitutional: Alert and oriented.  Eyes: Conjunctivae clear. Sclera anicteric. Head: Normocephalic. Atraumatic. Nose: No congestion. No rhinorrhea. Mouth/Throat: Mucous membranes are moist.  Neck: No stridor.   Cardiovascular: Normal rate, regular rhythm. No murmurs. Extremities well perfused. Respiratory: Normal respiratory effort.  Lungs CTAB. Gastrointestinal: Soft and non-tender. No distention.  Musculoskeletal: No lower extremity edema. Neurologic:  Normal speech and language. No gross focal neurologic deficits are appreciated.  Skin: Skin is warm, dry and intact. No rash noted. Psychiatric: Mood and affect are appropriate for situation.  EKG  N/A     Radiology  N/A   Procedures  Procedure(s) performed (including critical care):  Procedures   Initial Impression / Assessment and Plan / ED Course  38 y.o. male who presents to the ED for medical clearance, so that he may seek outpatient detox assistance from alcohol.  On exam, he is calm and cooperative.  He appears clinically sober.  He denies SI.  Will obtain screening labs and consult TTS.  Work-up reveals alcohol level of 286, though despite this he does appear clinically sober, with a clear and linear thought process, no slurred speech, ambulatory w/ steady gait.  Mildly elevated LFTs, consistent with alcohol use.  UDS negative.  Patient is medically clear.  Will consult TTS for assistance with detox resources.  Unfortunately, patient left the ED prior to completion of TTS consult.  He was contacted via phone, stated he did not plan to return.  Given he presented voluntarily, and was otherwise clinically sober on exam, do not feel further intervention is necessary.  Final Clinical Impression(s) / ED Diagnosis  Final diagnoses:  Alcohol abuse       Note:  This document was prepared using Dragon voice recognition software and may include unintentional dictation errors.   Miguel AschoffMonks,  L., MD 12/10/18 [phone]

## 2018-12-10 NOTE — ED Notes (Signed)
Pt walked out of ER stating he would be right back. After an hour patient did not return..  Nurse tech spoke with patient and he stated he was not returning to ER. No VS were obtained.  Pt was voluntary. EDP and charge nurse made aware.

## 2018-12-10 NOTE — ED Triage Notes (Addendum)
Pt was sent from [facility] for medical clearance before [facility]. Pt blew 310 at [facility]

## 2018-12-10 NOTE — ED Notes (Signed)
Nurse tech called emergency contact number for patient (his wife Verdis Frederickson).  Verdis Frederickson gave the number to the patient's cell phone to ask him directly if he would be returning to the ER. Patient stated he is not returning [phone]).

## 2018-12-10 NOTE — ED Notes (Signed)
Patient asked to step outside to talk with his father, and states  Be right back

## 2018-12-18 ENCOUNTER — Other Ambulatory Visit: Payer: Self-pay

## 2018-12-18 ENCOUNTER — Encounter: Payer: Self-pay | Admitting: Emergency Medicine

## 2018-12-18 ENCOUNTER — Emergency Department
Admission: EM | Admit: 2018-12-18 | Discharge: 2018-12-20 | Discharge status: Against Medical Advice | Payer: Self-pay | Attending: Emergency Medicine | Admitting: Emergency Medicine

## 2018-12-18 DIAGNOSIS — F1020 Alcohol dependence, uncomplicated: Secondary | ICD-10-CM | POA: Insufficient documentation

## 2018-12-18 DIAGNOSIS — Z20828 Contact with and (suspected) exposure to other viral communicable diseases: Secondary | ICD-10-CM | POA: Insufficient documentation

## 2018-12-18 DIAGNOSIS — Z5329 Procedure and treatment not carried out because of patient's decision for other reasons: Secondary | ICD-10-CM | POA: Insufficient documentation

## 2018-12-18 DIAGNOSIS — Z046 Encounter for general psychiatric examination, requested by authority: Secondary | ICD-10-CM | POA: Insufficient documentation

## 2018-12-18 DIAGNOSIS — Y908 Blood alcohol level of 240 mg/100 ml or more: Secondary | ICD-10-CM | POA: Insufficient documentation

## 2018-12-18 DIAGNOSIS — Z7141 Alcohol abuse counseling and surveillance of alcoholic: Secondary | ICD-10-CM | POA: Insufficient documentation

## 2018-12-18 LAB — ETHANOL: Alcohol, Ethyl (B): 347 mg/dL (ref <10)

## 2018-12-18 LAB — CBC WITH DIFFERENTIAL/PLATELET
Abs Immature Granulocytes: 0.01 10*3/uL (ref 0.00–0.07)
Basophils Absolute: 0.1 10*3/uL (ref 0.0–0.1)
Basophils Relative: 3 %
Eosinophils Absolute: 0 10*3/uL (ref 0.0–0.5)
Eosinophils Relative: 0 %
HCT: 40.2 % (ref 39.0–52.0)
Hemoglobin: 13.6 g/dL (ref 13.0–17.0)
Immature Granulocytes: 0 %
Lymphocytes Relative: 23 %
Lymphs Abs: 0.9 10*3/uL (ref 0.7–4.0)
MCH: 32.2 pg (ref 26.0–34.0)
MCHC: 33.8 g/dL (ref 30.0–36.0)
MCV: 95.3 fL (ref 80.0–100.0)
Monocytes Absolute: 0.3 10*3/uL (ref 0.1–1.0)
Monocytes Relative: 9 %
Neutro Abs: 2.4 10*3/uL (ref 1.7–7.7)
Neutrophils Relative %: 65 %
Platelets: 116 10*3/uL — ABNORMAL LOW (ref 150–400)
RBC: 4.22 MIL/uL (ref 4.22–5.81)
RDW: 14.9 % (ref 11.5–15.5)
WBC: 3.8 10*3/uL — ABNORMAL LOW (ref 4.0–10.5)
nRBC: 0 % (ref 0.0–0.2)

## 2018-12-18 LAB — COMPREHENSIVE METABOLIC PANEL
ALT: 87 U/L — ABNORMAL HIGH (ref 0–44)
AST: 226 U/L — ABNORMAL HIGH (ref 15–41)
Albumin: 4 g/dL (ref 3.5–5.0)
Alkaline Phosphatase: 123 U/L (ref 38–126)
Anion gap: 14 (ref 5–15)
BUN: <5 mg/dL — ABNORMAL LOW (ref 6–20)
CO2: 24 mmol/L (ref 22–32)
Calcium: 8.9 mg/dL (ref 8.9–10.3)
Chloride: 103 mmol/L (ref 98–111)
Creatinine, Ser: 0.49 mg/dL — ABNORMAL LOW (ref 0.61–1.24)
GFR calc Af Amer: >60 mL/min (ref >60)
GFR calc non Af Amer: >60 mL/min (ref >60)
Glucose, Bld: 104 mg/dL — ABNORMAL HIGH (ref 70–99)
Potassium: 3.1 mmol/L — ABNORMAL LOW (ref 3.5–5.1)
Sodium: 141 mmol/L (ref 135–145)
Total Bilirubin: 1.7 mg/dL — ABNORMAL HIGH (ref 0.3–1.2)
Total Protein: 8.2 g/dL — ABNORMAL HIGH (ref 6.5–8.1)

## 2018-12-18 MED ORDER — ADULT MULTIVITAMIN W/MINERALS CH
1.0000 | ORAL_TABLET | Freq: Every day | ORAL | Status: DC
  Administered 2018-12-18 – 2018-12-19 (×2): 1 via ORAL
  Filled 2018-12-18 (×2): qty 1

## 2018-12-18 MED ORDER — LORAZEPAM 1 MG PO TABS: 1.0000 mg | ORAL_TABLET | Freq: Four times a day (QID) | ORAL | Status: DC | PRN

## 2018-12-18 MED ORDER — VITAMIN B-1 100 MG PO TABS
100.0000 mg | ORAL_TABLET | Freq: Every day | ORAL | Status: DC
  Administered 2018-12-18 – 2018-12-19 (×2): 100 mg via ORAL
  Filled 2018-12-18 (×2): qty 1

## 2018-12-18 MED ORDER — LORAZEPAM 2 MG/ML IJ SOLN
1.0000 mg | Freq: Once | INTRAMUSCULAR | Status: AC
  Administered 2018-12-18: 1 mg via INTRAVENOUS
  Filled 2018-12-18: qty 1

## 2018-12-18 MED ORDER — ONDANSETRON HCL 4 MG/2ML IJ SOLN
4.0000 mg | Freq: Once | INTRAMUSCULAR | Status: AC
  Administered 2018-12-18: 4 mg via INTRAVENOUS
  Filled 2018-12-18: qty 2

## 2018-12-18 MED ORDER — SODIUM CHLORIDE 0.9 % IV SOLN
Freq: Once | INTRAVENOUS | Status: AC
  Administered 2018-12-18: 12:00:00 via INTRAVENOUS

## 2018-12-18 MED ORDER — FOLIC ACID 1 MG PO TABS
1.0000 mg | ORAL_TABLET | Freq: Every day | ORAL | Status: DC
  Administered 2018-12-18 – 2018-12-19 (×2): 1 mg via ORAL
  Filled 2018-12-18 (×2): qty 1

## 2018-12-18 MED ORDER — THIAMINE HCL 100 MG/ML IJ SOLN
100.0000 mg | Freq: Every day | INTRAMUSCULAR | Status: DC
  Filled 2018-12-18: qty 2

## 2018-12-18 MED ORDER — LORAZEPAM 2 MG/ML IJ SOLN
1.0000 mg | Freq: Four times a day (QID) | INTRAMUSCULAR | Status: DC | PRN
  Administered 2018-12-19 (×3): 1 mg via INTRAVENOUS
  Filled 2018-12-18 (×3): qty 1

## 2018-12-18 NOTE — ED Notes (Signed)
Pt appears to be sleeping, in NAD.

## 2018-12-18 NOTE — BH Assessment (Signed)
Assessment Note  Brett Washington is an 38 y.o. male who presents to the ER seeking assistance with alcohol detox. Patient reports he is drinking ten, eight ounce beers a day. This have been ongoing for years. The longest he's been sober is no more than forty-eight hours.  His symptoms of withdrawal are; shakes, cold chills, vomiting and at time it has had some blood in it. He also reports approximately two weeks ago, he had a seizure when he was trying to detox on his own. He has had them in the past and it's only related to his alcohol use and trying to stop.  He identified his wife, children and his parents as his primary support system and he want to become sober so he can spend quality time with them.  During the interview, the patient was pleasant and able to provide appropriate answers to the questions. Patient denies SI/HI and AV/H. He also denies involvement with the legal system. He has no history of violence or aggression. He denies the use of any other mind-altering substances. His labs hadn't result during the time of this assessment but previous labs reflects what he is saying is true.  Diagnosis: Alcohol Use Disorder  Past Medical History:  Past Medical History:  Diagnosis Date  . Alcohol abuse   . Heart murmur   . Seizures ([place])     Past Surgical History:  Procedure Laterality Date  . HAND SURGERY      Family History:  Family History  Family history unknown: Yes    Social History:  reports that he has never smoked. He has never used smokeless tobacco. He reports current alcohol use of about 56.0 standard drinks of alcohol per week. He reports that he does not use drugs.  Additional Social History:  Alcohol / Drug Use Pain Medications: See PTA Prescriptions: See PTA Over the Counter: See PTA History of alcohol / drug use?: Yes Longest period of sobriety (when/how long): Unable to quantify Withdrawal Symptoms: Fever / Chills, Tremors, Sweats, Nausea /  Vomiting Substance #1 Name of Substance 1: Alcohol 1 - Age of First Use: Teenager 1 - Amount (size/oz): "Ten smalls ones (8oz)." 1 - Frequency: Daily 1 - Duration: "Long time. For a long time." 1 - Last Use / Amount: 12/18/2018  CIWA: CIWA-Ar BP: (!) 139/91 Pulse Rate: (!) 104 Nausea and Vomiting: intermittent nausea with dry heaves Tactile Disturbances: none Tremor: two Auditory Disturbances: mild harshness or ability to frighten Paroxysmal Sweats: no sweat visible Visual Disturbances: not present Anxiety: moderately anxious, or guarded, so anxiety is inferred Headache, Fullness in Head: moderately severe Agitation: normal activity Orientation and Clouding of Sensorium: disoriented for date by more than 2 calendar days CIWA-Ar Total: 19 COWS:    Allergies: No Known Allergies  Home Medications: (Not in a hospital admission)   OB/GYN Status:  No LMP for male patient.  General Assessment Data Location of Assessment: [facility] ED TTS Assessment: In system Is this a Tele or Face-to-Face Assessment?: Face-to-Face Is this an Initial Assessment or a Re-assessment for this encounter?: Initial Assessment Language Other than English: No Living Arrangements: Other (Comment)(Private Home) What gender do you identify as?: Male Marital status: Married Pregnancy Status: No Living Arrangements: Spouse/significant other, Children Can pt return to current living arrangement?: Yes Admission Status: Voluntary Is patient capable of signing voluntary admission?: Yes Referral Source: Self/Family/Friend Insurance type: None  Medical Screening Exam ([place]) Medical Exam completed: Yes  Crisis Care Plan Living Arrangements: Spouse/significant other, Children  Legal Guardian: Other:(Self) Name of Psychiatrist: Reports of none Name of Therapist: Reports of none  Education Status Is patient currently in school?: No Is the patient employed, unemployed or receiving disability?:  Unemployed  Risk to self with the past 6 months Suicidal Ideation: No Has patient been a risk to self within the past 6 months prior to admission? : No Suicidal Intent: No Has patient had any suicidal intent within the past 6 months prior to admission? : No Is patient at risk for suicide?: No Suicidal Plan?: No Has patient had any suicidal plan within the past 6 months prior to admission? : No Access to Means: No What has been your use of drugs/alcohol within the last 12 months?: Alcohol Previous Attempts/Gestures: No How many times?: 0 Other Self Harm Risks: Active Alcohol Abuse Triggers for Past Attempts: None known Intentional Self Injurious Behavior: None Family Suicide History: No Recent stressful life event(s): Other (Comment)(Alcohol) Persecutory voices/beliefs?: No Depression: Yes Depression Symptoms: Guilt Substance abuse history and/or treatment for substance abuse?: Yes Suicide prevention information given to non-admitted patients: Not applicable  Risk to Others within the past 6 months Homicidal Ideation: No Does patient have any lifetime risk of violence toward others beyond the six months prior to admission? : No Thoughts of Harm to Others: No Current Homicidal Intent: No Current Homicidal Plan: No Access to Homicidal Means: No Identified Victim: Reports of none History of harm to others?: No Assessment of Violence: None Noted Violent Behavior Description: Reports of none Does patient have access to weapons?: No Criminal Charges Pending?: No Does patient have a court date: No Is patient on probation?: No  Psychosis Hallucinations: None noted Delusions: None noted  Mental Status Report Appearance/Hygiene: Unremarkable, In scrubs Eye Contact: Fair Motor Activity: Freedom of movement, Unremarkable Speech: Logical/coherent, Soft, Unremarkable Level of Consciousness: Alert Mood: Anxious, Sad, Pleasant Affect: Appropriate to circumstance, Anxious,  Sad Anxiety Level: Minimal Thought Processes: Coherent, Relevant Judgement: Partial Orientation: Person, Place, Time, Situation, Appropriate for developmental age Obsessive Compulsive Thoughts/Behaviors: Minimal  Cognitive Functioning Concentration: Decreased Memory: Recent Intact, Remote Intact Is patient IDD: No Insight: Fair Impulse Control: Fair Appetite: Good Have you had any weight changes? : No Change Sleep: No Change Total Hours of Sleep: 5 Vegetative Symptoms: None  ADLScreening [facility] Assessment Services) Patient's cognitive ability adequate to safely complete daily activities?: Yes Patient able to express need for assistance with ADLs?: Yes Independently performs ADLs?: Yes (appropriate for developmental age)  Prior Inpatient Therapy Prior Inpatient Therapy: No  Prior Outpatient Therapy Prior Outpatient Therapy: No Does patient have an ACCT team?: No Does patient have Intensive In-House Services?  : No Does patient have [facility] services? : No Does patient have P4CC services?: No  ADL Screening (condition at time of admission) Patient's cognitive ability adequate to safely complete daily activities?: Yes Is the patient deaf or have difficulty hearing?: No Does the patient have difficulty seeing, even when wearing glasses/contacts?: No Does the patient have difficulty concentrating, remembering, or making decisions?: No Patient able to express need for assistance with ADLs?: Yes Does the patient have difficulty dressing or bathing?: No Independently performs ADLs?: Yes (appropriate for developmental age) Does the patient have difficulty walking or climbing stairs?: No Weakness of Legs: None Weakness of Arms/Hands: None  Home Assistive Devices/Equipment Home Assistive Devices/Equipment: None  Therapy Consults (therapy consults require a physician order) PT Evaluation Needed: No OT Evalulation Needed: No SLP Evaluation Needed: No Abuse/Neglect Assessment  (Assessment to be complete while patient is alone)  Abuse/Neglect Assessment Can Be Completed: Yes Physical Abuse: Denies Verbal Abuse: Denies Sexual Abuse: Denies Exploitation of patient/patient's resources: Denies Self-Neglect: Denies Values / Beliefs Cultural Requests During Hospitalization: None Spiritual Requests During Hospitalization: None Consults Spiritual Care Consult Needed: No Social Work Consult Needed: No         Child/Adolescent Assessment Running Away Risk: Denies(Patient is an adult)  Disposition:  Disposition Initial Assessment Completed for this Encounter: Yes Patient referred to: Other (Comment)(Refer to [employer])  On Site Evaluation by:   Reviewed with Physician:    Lilyan Gilford MS, LCAS, [facility], NCC Therapeutic Triage Specialist 12/18/2018 5:15 PM

## 2018-12-18 NOTE — ED Provider Notes (Signed)
[facility] Emergency Department Provider Note       Time seen: ----------------------------------------- 10:54 AM on 12/18/2018 -----------------------------------------   I have reviewed the triage vital signs and the nursing notes.  HISTORY   Chief Complaint No chief complaint on file.    HPI Brett Washington is a 38 y.o. male with a history of alcohol abuse, heart murmur, seizures who presents to the ED for severe alcohol use disorder requesting detox.  Patient drank 2 large beers last night.  He arrives alert and oriented, is complaining of 8 out of 10 headache.  Past Medical History:  Diagnosis Date  . Alcohol abuse   . Heart murmur   . Seizures [facility](HCC)     Patient Active Problem List   Diagnosis Date Noted  . LOC (loss of consciousness) (HCC) 01/11/2017  . Alcohol use disorder, severe, dependence (HCC) 11/06/2016  . Aspiration pneumonia due to vomit (HCC)   . Alcohol withdrawal seizure (HCC) 10/31/2016  . Thrombocytopenia (HCC) 10/31/2016  . Transaminitis 10/31/2016  . Hypokalemia 10/31/2016    Past Surgical History:  Procedure Laterality Date  . HAND SURGERY      Allergies Patient has no known allergies.  Social History Social History   Tobacco Use  . Smoking status: Never Smoker  . Smokeless tobacco: Never Used  Substance Use Topics  . Alcohol use: Yes    Alcohol/week: 56.0 standard drinks    Types: 56 Cans of beer per week  . Drug use: No   Review of Systems Constitutional: Negative for fever. Cardiovascular: Negative for chest pain. Respiratory: Negative for shortness of breath. Gastrointestinal: Negative for abdominal pain, vomiting and diarrhea. Musculoskeletal: Negative for back pain. Skin: Negative for rash. Neurological: Positive for headache Psychiatric: Positive for alcohol use disorder  All systems negative/normal/unremarkable except as stated in the  HPI  ____________________________________________   PHYSICAL EXAM:  VITAL SIGNS: ED Triage Vitals  Enc Vitals Group     BP 12/18/18 1033 (!) 139/91     Pulse Rate 12/18/18 1033 (!) 104     Resp 12/18/18 1033 20     Temp 12/18/18 1033 98.6 F (37 C)     Temp Source 12/18/18 1033 Oral     SpO2 12/18/18 1033 96 %     Weight 12/18/18 1041 183 lb (83 kg)     Height 12/18/18 1041 5\' 6"  (1.676 m)     Head Circumference --      Peak Flow --      Pain Score 12/18/18 1041 8     Pain Loc --      Pain Edu? --      Excl. in GC? --    Constitutional: Alert and oriented.  Mild distress Eyes: Conjunctivae are normal. Normal extraocular movements. ENT      Head: Normocephalic and atraumatic.      Nose: No congestion/rhinnorhea.      Mouth/Throat: Mucous membranes are moist.      Neck: No stridor. Cardiovascular: Rapid rate, regular rhythm. No murmurs, rubs, or gallops. Respiratory: Normal respiratory effort without tachypnea nor retractions. Breath sounds are clear and equal bilaterally. No wheezes/rales/rhonchi. Gastrointestinal: Soft and nontender. Normal bowel sounds Musculoskeletal: Nontender with normal range of motion in extremities. No lower extremity tenderness nor edema. Neurologic:  Normal speech and language. No gross focal neurologic deficits are appreciated.  Skin:  Skin is warm, dry and intact. No rash noted. Psychiatric: Mood and affect are normal. Speech and behavior are normal.  ____________________________________________  ED  COURSE:  As part of my medical decision making, I reviewed the following data within the [place] History obtained from family if available, nursing notes, old chart and ekg, as well as notes from prior ED visits. Patient presented for severe alcohol use disorder requesting detox, we will assess with labs as indicated at this time.  Patient will be placed on CIWA   Procedures  Brett Washington was evaluated in Emergency  Department on 12/18/2018 for the symptoms described in the history of present illness. He was evaluated in the context of the global COVID-19 pandemic, which necessitated consideration that the patient might be at risk for infection with the SARS-CoV-2 virus that causes COVID-19. Institutional protocols and algorithms that pertain to the evaluation of patients at risk for COVID-19 are in a state of rapid change based on information released by regulatory bodies including the CDC and federal and state organizations. These policies and algorithms were followed during the patient's care in the ED.  ____________________________________________   LABS (pertinent positives/negatives)  Labs Reviewed  CBC WITH DIFFERENTIAL/PLATELET - Abnormal; Notable for the following components:      Result Value   WBC 3.8 (*)    Platelets 116 (*)    All other components within normal limits  COMPREHENSIVE METABOLIC PANEL - Abnormal; Notable for the following components:   Potassium 3.1 (*)    Glucose, Bld 104 (*)    BUN <5 (*)    Creatinine, Ser 0.49 (*)    Total Protein 8.2 (*)    AST 226 (*)    ALT 87 (*)    Total Bilirubin 1.7 (*)    All other components within normal limits  ETHANOL - Abnormal; Notable for the following components:   Alcohol, Ethyl (B) 347 (*)    All other components within normal limits  URINE DRUG SCREEN, QUALITATIVE ([facility] ONLY)   ___________________________________________   DIFFERENTIAL DIAGNOSIS   Severe alcohol use disorder, withdrawal, early DTs  FINAL ASSESSMENT AND PLAN  Alcohol use disorder, alcohol intoxication   Plan: The patient had presented for severe alcoholism requesting detox. Patient's labs do still indicate significant intoxication at this time with an alcohol level of 347.  We will discuss with TTS for detox referral.   Laurence Aly, MD    Note: This note was generated in part or whole with voice recognition software. Voice recognition is usually  quite accurate but there are transcription errors that can and very often do occur. I apologize for any typographical errors that were not detected and corrected.     Earleen Newport, MD 12/18/18 1150

## 2018-12-18 NOTE — BH Assessment (Addendum)
Patient information faxed to [place] for his alcohol use;   [facility] ([place] [phone]), No male beds. Also reports the patient hadn't contacted them about a bed.   [facility] [phone])   [facility] [phone])   [place] [facility] [phone] or [phone])

## 2018-12-18 NOTE — ED Triage Notes (Signed)
Pt here wanting detox of ETOH  to go to [facility] for treatment. PT is A&OX4, last drink was last night.

## 2018-12-18 NOTE — ED Notes (Signed)
Pt calm, sitting in bed texting.

## 2018-12-18 NOTE — ED Notes (Signed)
Dr Jimmye Norman notified of pts ETOH 347 and ciwa score of 19. No new orders

## 2018-12-18 NOTE — ED Notes (Signed)
Pt states he came to the ED for " nausea and withdrawal seizures". States he last drank 2 large beers last night. Pt actively dry heaving. Alert and oriented x4.

## 2018-12-18 NOTE — ED Notes (Signed)
Pt sleeping, no s/s of alcohol withdrawal noted.

## 2018-12-18 NOTE — ED Notes (Signed)
Pt appears calm, lying on stretcher texting on cell phone.

## 2018-12-19 LAB — COMPREHENSIVE METABOLIC PANEL
ALT: 80 U/L — ABNORMAL HIGH (ref 0–44)
AST: 212 U/L — ABNORMAL HIGH (ref 15–41)
Albumin: 3.4 g/dL — ABNORMAL LOW (ref 3.5–5.0)
Alkaline Phosphatase: 109 U/L (ref 38–126)
Anion gap: 12 (ref 5–15)
BUN: 6 mg/dL (ref 6–20)
CO2: 25 mmol/L (ref 22–32)
Calcium: 8.9 mg/dL (ref 8.9–10.3)
Chloride: 99 mmol/L (ref 98–111)
Creatinine, Ser: 0.44 mg/dL — ABNORMAL LOW (ref 0.61–1.24)
GFR calc Af Amer: >60 mL/min (ref >60)
GFR calc non Af Amer: >60 mL/min (ref >60)
Glucose, Bld: 96 mg/dL (ref 70–99)
Potassium: 3.4 mmol/L — ABNORMAL LOW (ref 3.5–5.1)
Sodium: 136 mmol/L (ref 135–145)
Total Bilirubin: 2.1 mg/dL — ABNORMAL HIGH (ref 0.3–1.2)
Total Protein: 6.8 g/dL (ref 6.5–8.1)

## 2018-12-19 LAB — CBC WITH DIFFERENTIAL/PLATELET
Abs Immature Granulocytes: 0.02 10*3/uL (ref 0.00–0.07)
Basophils Absolute: 0.1 10*3/uL (ref 0.0–0.1)
Basophils Relative: 1 %
Eosinophils Absolute: 0 10*3/uL (ref 0.0–0.5)
Eosinophils Relative: 1 %
HCT: 37 % — ABNORMAL LOW (ref 39.0–52.0)
Hemoglobin: 12.7 g/dL — ABNORMAL LOW (ref 13.0–17.0)
Immature Granulocytes: 1 %
Lymphocytes Relative: 13 %
Lymphs Abs: 0.6 10*3/uL — ABNORMAL LOW (ref 0.7–4.0)
MCH: 32.9 pg (ref 26.0–34.0)
MCHC: 34.3 g/dL (ref 30.0–36.0)
MCV: 95.9 fL (ref 80.0–100.0)
Monocytes Absolute: 0.4 10*3/uL (ref 0.1–1.0)
Monocytes Relative: 10 %
Neutro Abs: 3.2 10*3/uL (ref 1.7–7.7)
Neutrophils Relative %: 74 %
Platelets: 71 10*3/uL — ABNORMAL LOW (ref 150–400)
RBC: 3.86 MIL/uL — ABNORMAL LOW (ref 4.22–5.81)
RDW: 14.6 % (ref 11.5–15.5)
WBC: 4.3 10*3/uL (ref 4.0–10.5)
nRBC: 0 % (ref 0.0–0.2)

## 2018-12-19 LAB — URINE DRUG SCREEN, QUALITATIVE (ARMC ONLY)
Amphetamines, Ur Screen: NOT DETECTED
Barbiturates, Ur Screen: NOT DETECTED
Benzodiazepine, Ur Scrn: POSITIVE — AB
Cannabinoid 50 Ng, Ur ~~LOC~~: NOT DETECTED
Cocaine Metabolite,Ur ~~LOC~~: NOT DETECTED
MDMA (Ecstasy)Ur Screen: NOT DETECTED
Methadone Scn, Ur: NOT DETECTED
Opiate, Ur Screen: NOT DETECTED
Phencyclidine (PCP) Ur S: NOT DETECTED
Tricyclic, Ur Screen: NOT DETECTED

## 2018-12-19 LAB — SARS CORONAVIRUS 2 BY RT PCR (HOSPITAL ORDER, PERFORMED IN ~~LOC~~ HOSPITAL LAB): SARS Coronavirus 2: NEGATIVE

## 2018-12-19 LAB — MAGNESIUM: Magnesium: 1.7 mg/dL (ref 1.7–2.4)

## 2018-12-19 MED ORDER — ALUM & MAG HYDROXIDE-SIMETH 200-200-20 MG/5ML PO SUSP
30.0000 mL | Freq: Once | ORAL | Status: AC
  Administered 2018-12-19: 30 mL via ORAL
  Filled 2018-12-19: qty 30

## 2018-12-19 MED ORDER — LIDOCAINE VISCOUS HCL 2 % MT SOLN
15.0000 mL | Freq: Once | OROMUCOSAL | Status: AC
  Administered 2018-12-19: 15 mL via ORAL
  Filled 2018-12-19: qty 15

## 2018-12-19 NOTE — ED Notes (Signed)
Pt given meal tray and drink.

## 2018-12-19 NOTE — ED Notes (Signed)
Pt resting comfortably at this time.

## 2018-12-19 NOTE — BH Assessment (Signed)
TTS gave oral report of CIWA-Ar and MAR to nurse Patty/[facility].  The CIWA-Ar scores remain a little elevated for what they can accommodate, and there is concern about acuity. Patty requests that we continue to monitor with CIWA-Ar scores regularly and return call to her around 8:30pm at [facility].

## 2018-12-19 NOTE — ED Provider Notes (Signed)
Discussed with TTS.  Patient is waiting to go to the [facility].  Patient needs repeat labs as well as updated CIWA number.   Repeat labs improved.  Pending SW.      Vanessa Pierceton, MD 12/19/18 [phone]

## 2018-12-19 NOTE — BH Assessment (Signed)
Updated referral information was sent to the following:   [facility] [place] [facility]

## 2018-12-20 NOTE — Discharge Instructions (Addendum)
You have a bed at [facility] but have decided to leave against medical advice. Return to the ER for worsening symptoms, persistent vomiting, difficulty breathing or other concerns.

## 2018-12-20 NOTE — ED Notes (Signed)
Pt seen walking out of ED by others at this time. Pt not waiting for this RN to give address to detox facility or to sign AMA DC. Pt already gone by the time this RN went to lobby.

## 2018-12-20 NOTE — ED Notes (Signed)
TTS spoke with Patty at [facility]. She was given updated information on CIWA, Medication, and symptoms.  Patty informed TTS that the patient may arrive for further evaluation after 8:30 a.m. on 12/20/2018.  Patient is to report to [address] [place], [place] [address].

## 2019-04-02 DIAGNOSIS — K7030 Alcoholic cirrhosis of liver without ascites: Secondary | ICD-10-CM | POA: Insufficient documentation

## 2019-04-05 DIAGNOSIS — F411 Generalized anxiety disorder: Secondary | ICD-10-CM | POA: Insufficient documentation

## 2019-04-29 ENCOUNTER — Other Ambulatory Visit: Payer: Self-pay

## 2019-04-29 ENCOUNTER — Emergency Department
Admission: EM | Admit: 2019-04-29 | Discharge: 2019-04-29 | Disposition: A | Discharge status: Home / Self Care | Payer: Self-pay | Attending: Emergency Medicine | Admitting: Emergency Medicine

## 2019-04-29 ENCOUNTER — Emergency Department: Payer: Self-pay

## 2019-04-29 DIAGNOSIS — F10920 Alcohol use, unspecified with intoxication, uncomplicated: Secondary | ICD-10-CM | POA: Insufficient documentation

## 2019-04-29 LAB — CK: Total CK: 298 U/L (ref 49–397)

## 2019-04-29 LAB — COMPREHENSIVE METABOLIC PANEL
ALT: 33 U/L (ref 0–44)
AST: 146 U/L — ABNORMAL HIGH (ref 15–41)
Albumin: 3.7 g/dL (ref 3.5–5.0)
Alkaline Phosphatase: 196 U/L — ABNORMAL HIGH (ref 38–126)
Anion gap: 15 (ref 5–15)
BUN: <5 mg/dL — ABNORMAL LOW (ref 6–20)
CO2: 20 mmol/L — ABNORMAL LOW (ref 22–32)
Calcium: 8.7 mg/dL — ABNORMAL LOW (ref 8.9–10.3)
Chloride: 106 mmol/L (ref 98–111)
Creatinine, Ser: 0.42 mg/dL — ABNORMAL LOW (ref 0.61–1.24)
GFR calc Af Amer: >60 mL/min (ref >60)
GFR calc non Af Amer: >60 mL/min (ref >60)
Glucose, Bld: 133 mg/dL — ABNORMAL HIGH (ref 70–99)
Potassium: 3.6 mmol/L (ref 3.5–5.1)
Sodium: 141 mmol/L (ref 135–145)
Total Bilirubin: 1.2 mg/dL (ref 0.3–1.2)
Total Protein: 8.7 g/dL — ABNORMAL HIGH (ref 6.5–8.1)

## 2019-04-29 LAB — CBC WITH DIFFERENTIAL/PLATELET
Abs Immature Granulocytes: 0.01 10*3/uL (ref 0.00–0.07)
Basophils Absolute: 0 10*3/uL (ref 0.0–0.1)
Basophils Relative: 1 %
Eosinophils Absolute: 0 10*3/uL (ref 0.0–0.5)
Eosinophils Relative: 0 %
HCT: 39.5 % (ref 39.0–52.0)
Hemoglobin: 13.2 g/dL (ref 13.0–17.0)
Immature Granulocytes: 0 %
Lymphocytes Relative: 29 %
Lymphs Abs: 1.7 10*3/uL (ref 0.7–4.0)
MCH: 32.8 pg (ref 26.0–34.0)
MCHC: 33.4 g/dL (ref 30.0–36.0)
MCV: 98.3 fL (ref 80.0–100.0)
Monocytes Absolute: 0.3 10*3/uL (ref 0.1–1.0)
Monocytes Relative: 5 %
Neutro Abs: 3.7 10*3/uL (ref 1.7–7.7)
Neutrophils Relative %: 65 %
Platelets: 143 10*3/uL — ABNORMAL LOW (ref 150–400)
RBC: 4.02 MIL/uL — ABNORMAL LOW (ref 4.22–5.81)
RDW: 12.6 % (ref 11.5–15.5)
WBC: 5.6 10*3/uL (ref 4.0–10.5)
nRBC: 0 % (ref 0.0–0.2)

## 2019-04-29 LAB — URINALYSIS, COMPLETE (UACMP) WITH MICROSCOPIC
Bacteria, UA: NONE SEEN
Bilirubin Urine: NEGATIVE
Glucose, UA: NEGATIVE mg/dL
Hgb urine dipstick: NEGATIVE
Ketones, ur: NEGATIVE mg/dL
Leukocytes,Ua: NEGATIVE
Nitrite: NEGATIVE
Protein, ur: NEGATIVE mg/dL
Specific Gravity, Urine: 1.005 (ref 1.005–1.030)
Squamous Epithelial / HPF: NONE SEEN (ref 0–5)
pH: 6 (ref 5.0–8.0)

## 2019-04-29 LAB — URINE DRUG SCREEN, QUALITATIVE (ARMC ONLY)
Amphetamines, Ur Screen: NOT DETECTED
Barbiturates, Ur Screen: NOT DETECTED
Benzodiazepine, Ur Scrn: POSITIVE — AB
Cannabinoid 50 Ng, Ur ~~LOC~~: NOT DETECTED
Cocaine Metabolite,Ur ~~LOC~~: NOT DETECTED
MDMA (Ecstasy)Ur Screen: NOT DETECTED
Methadone Scn, Ur: NOT DETECTED
Opiate, Ur Screen: NOT DETECTED
Phencyclidine (PCP) Ur S: NOT DETECTED
Tricyclic, Ur Screen: NOT DETECTED

## 2019-04-29 LAB — ACETAMINOPHEN LEVEL: Acetaminophen (Tylenol), Serum: <10 ug/mL — ABNORMAL LOW (ref 10–30)

## 2019-04-29 LAB — SALICYLATE LEVEL: Salicylate Lvl: <7 mg/dL — ABNORMAL LOW (ref 7.0–30.0)

## 2019-04-29 LAB — ETHANOL: Alcohol, Ethyl (B): 491 mg/dL (ref <10)

## 2019-04-29 MED ORDER — SODIUM CHLORIDE 0.9 % IV SOLN
1.0000 mg | Freq: Once | INTRAVENOUS | Status: AC
  Administered 2019-04-29: 14:00:00 1 mg via INTRAVENOUS
  Filled 2019-04-29: qty 0.2

## 2019-04-29 MED ORDER — LORAZEPAM 2 MG/ML IJ SOLN
1.0000 mg | Freq: Once | INTRAMUSCULAR | Status: DC
  Filled 2019-04-29: qty 1

## 2019-04-29 MED ORDER — SODIUM CHLORIDE 0.9 % IV SOLN: Freq: Once | INTRAVENOUS | Status: AC

## 2019-04-29 MED ORDER — SODIUM CHLORIDE 0.9 % IV BOLUS
1000.0000 mL | Freq: Once | INTRAVENOUS | Status: AC
  Administered 2019-04-29: 11:00:00 1000 mL via INTRAVENOUS

## 2019-04-29 MED ORDER — THIAMINE HCL 100 MG/ML IJ SOLN
100.0000 mg | Freq: Once | INTRAMUSCULAR | Status: AC
  Administered 2019-04-29: 11:00:00 100 mg via INTRAVENOUS
  Filled 2019-04-29: qty 2

## 2019-04-29 MED ORDER — LORAZEPAM 2 MG PO TABS
2.0000 mg | ORAL_TABLET | Freq: Once | ORAL | Status: AC
  Administered 2019-04-29: 19:00:00 2 mg via ORAL
  Filled 2019-04-29: qty 1

## 2019-04-29 NOTE — ED Provider Notes (Signed)
Assumed care from Dr. Colon Branch  at 3 PM. Briefly, the patient is a 39 y.o. male with PMHx of  has a past medical history of Alcohol abuse, Heart murmur, and Seizures (HCC). here with acute alcohol intoxication. Sobering in ED. History of multiple ED visits for same. No apparent head trauma or organic etiology. Afebrile..   Labs Reviewed  ACETAMINOPHEN LEVEL - Abnormal; Notable for the following components:      Result Value   Acetaminophen (Tylenol), Serum <10 (*)    All other components within normal limits  CBC WITH DIFFERENTIAL/PLATELET - Abnormal; Notable for the following components:   RBC 4.02 (*)    Platelets 143 (*)    All other components within normal limits  COMPREHENSIVE METABOLIC PANEL - Abnormal; Notable for the following components:   CO2 20 (*)    Glucose, Bld 133 (*)    BUN <5 (*)    Creatinine, Ser 0.42 (*)    Calcium 8.7 (*)    Total Protein 8.7 (*)    AST 146 (*)    Alkaline Phosphatase 196 (*)    All other components within normal limits  ETHANOL - Abnormal; Notable for the following components:   Alcohol, Ethyl (B) 491 (*)    All other components within normal limits  URINALYSIS, COMPLETE (UACMP) WITH MICROSCOPIC - Abnormal; Notable for the following components:   Color, Urine YELLOW (*)    APPearance CLEAR (*)    All other components within normal limits  SALICYLATE LEVEL - Abnormal; Notable for the following components:   Salicylate Lvl <7.0 (*)    All other components within normal limits  URINE DRUG SCREEN, QUALITATIVE ([facility] ONLY) - Abnormal; Notable for the following components:   Benzodiazepine, Ur Scrn POSITIVE (*)    All other components within normal limits  CK    Course of Care: -Pt now awake, alert. Ambulating in ED. He did begin to show some mild tremors with CIWA 11. Ativan given. Tolerating PO. Denies SI, HI, AVH. D/c home.     Shaune Pollack, MD 04/29/19 [phone]

## 2019-04-29 NOTE — ED Provider Notes (Signed)
[facility] Emergency Department Provider Note  ____________________________________________   First MD Initiated Contact with Patient 04/29/19 1041     (approximate)  I have reviewed the triage vital signs and the nursing notes.  History  Chief Complaint Altered Mental Status    HPI Brett Washington is a 39 y.o. male with a history of alcohol abuse, withdrawal seizures, who presents to the emergency department via EMS.  Patient was found outside a restaurant by PD.  Per bystanders, he had been drinking this morning, however the patient states that his last drink was yesterday.  He seems confused versus intoxicated on arrival.  Sleeping, but opens his eyes to touch and voice, follows commands.  Caveat: History somewhat limited due to the patient's altered mental status, clinical presentation   Past Medical Hx Past Medical History:  Diagnosis Date  . Alcohol abuse   . Heart murmur   . Seizures [facility])     Problem List Patient Active Problem List   Diagnosis Date Noted  . LOC (loss of consciousness) (HCC) 01/11/2017  . Alcohol use disorder, severe, dependence (HCC) 11/06/2016  . Aspiration pneumonia due to vomit (HCC)   . Alcohol withdrawal seizure (HCC) 10/31/2016  . Thrombocytopenia (HCC) 10/31/2016  . Transaminitis 10/31/2016  . Hypokalemia 10/31/2016    Past Surgical Hx Past Surgical History:  Procedure Laterality Date  . HAND SURGERY      Medications Prior to Admission medications   Not on File    Allergies Patient has no known allergies.  Family Hx Family History  Family history unknown: Yes    Social Hx Social History   Tobacco Use  . Smoking status: Never Smoker  . Smokeless tobacco: Never Used  Substance Use Topics  . Alcohol use: Yes    Alcohol/week: 56.0 standard drinks    Types: 56 Cans of beer per week  . Drug use: No     Review of Systems Unable to obtain due to patient's clinical presentation, altered mental  status.   Physical Exam  Vital Signs: ED Triage Vitals  Enc Vitals Group     BP 04/29/19 1020 (!) 82/73     Pulse Rate 04/29/19 1020 99     Resp 04/29/19 1020 (!) 24     Temp --      Temp src --      SpO2 04/29/19 1020 100 %     Weight 04/29/19 1025 179 lb 7.3 oz (81.4 kg)     Height 04/29/19 1025 5\' 7"  (1.702 m)     Head Circumference --      Peak Flow --      Pain Score --      Pain Loc --      Pain Edu? --      Excl. in GC? --     Constitutional: Sleeping, rouses to stimulation. Seems confused vs intoxicated.  Head: Normocephalic. Atraumatic. Eyes: Conjunctivae clear. Sclera anicteric.  Pupils dilated, equal and reactive to light. Nose: No congestion. No rhinorrhea. Mouth/Throat: Wearing mask.  Neck: No stridor.   Cardiovascular: Normal rate, regular rhythm. Extremities well perfused. Respiratory: Normal respiratory effort.  Lungs CTAB. Gastrointestinal: Soft. Non-tender. Non-distended.  Musculoskeletal: No lower extremity edema. No deformities. Neurologic:  Confused vs intoxicated. Moves all extremities. Follows commands. No gross focal neurologic deficits are appreciated.  Skin: Skin is warm, dry and intact. No rash noted. Psychiatric: Mood and affect are appropriate for situation.  EKG  Personally reviewed.   Rate: 70 Rhythm: sinus  Axis: LAD Intervals: prolonged QT No STEMI    Radiology  CT head:  IMPRESSION:  Mild atrophy. Brain parenchyma appears unremarkable. No mass or hemorrhage.  There is mucosal thickening in several ethmoid air cells.    Procedures  Procedure(s) performed (including critical care):  Procedures   Initial Impression / Assessment and Plan / ED Course  39 y.o. male who presents to the ED for AMS, possible intoxication.   Ddx: alcohol intoxication, intracranial abnormality, toxicologic, hypothermia (unclear how long he was outside for), withdrawal seizure w/ post ictal period  Will obtain labs, imaging, fluids,  thiamine/folic acid and reassess  Work-up reveals significant intoxication.  Alcohol level 491.  Suspect this is the likely etiology of his presentation. Remainder of AMS work up essentially unremarkable. We will continue to monitor until clinically sober, and then anticipate discharge.   Final Clinical Impression(s) / ED Diagnosis  Final diagnoses:  Alcoholic intoxication without complication [facility])       Note:  This document was prepared using Dragon voice recognition software and may include unintentional dictation errors.   Lilia Pro., MD 04/29/19 [phone]

## 2019-04-29 NOTE — ED Triage Notes (Signed)
Patient brought in by [facility]. Patient was found outside by [employer]. Per bystanders, patient has been drinking this morning. Upon arrival, patient opening eyes to touch and voices, able to follow most commands, however patient is unable to answer questions appropriately, seems tearful and has mumbled speech. MD at bedside.

## 2019-04-29 NOTE — ED Notes (Signed)
Pt given urinal at this time. Pt states he feels like he needs a beer and it would help him feel better. Pt states he usually drinks about 5 big beers a day depending on if he has money or not.

## 2019-06-01 ENCOUNTER — Emergency Department
Admission: EM | Admit: 2019-06-01 | Discharge: 2019-06-01 | Disposition: A | Discharge status: Home / Self Care | Payer: HRSA Program | Attending: Emergency Medicine | Admitting: Emergency Medicine

## 2019-06-01 ENCOUNTER — Other Ambulatory Visit: Payer: Self-pay

## 2019-06-01 DIAGNOSIS — F10920 Alcohol use, unspecified with intoxication, uncomplicated: Secondary | ICD-10-CM

## 2019-06-01 DIAGNOSIS — U071 COVID-19: Secondary | ICD-10-CM | POA: Insufficient documentation

## 2019-06-01 DIAGNOSIS — Y908 Blood alcohol level of 240 mg/100 ml or more: Secondary | ICD-10-CM | POA: Insufficient documentation

## 2019-06-01 LAB — URINE DRUG SCREEN, QUALITATIVE (ARMC ONLY)
Amphetamines, Ur Screen: NOT DETECTED
Barbiturates, Ur Screen: NOT DETECTED
Benzodiazepine, Ur Scrn: NOT DETECTED
Cannabinoid 50 Ng, Ur ~~LOC~~: NOT DETECTED
Cocaine Metabolite,Ur ~~LOC~~: NOT DETECTED
MDMA (Ecstasy)Ur Screen: NOT DETECTED
Methadone Scn, Ur: NOT DETECTED
Opiate, Ur Screen: NOT DETECTED
Phencyclidine (PCP) Ur S: NOT DETECTED
Tricyclic, Ur Screen: NOT DETECTED

## 2019-06-01 LAB — COMPREHENSIVE METABOLIC PANEL
ALT: 32 U/L (ref 0–44)
AST: 81 U/L — ABNORMAL HIGH (ref 15–41)
Albumin: 4 g/dL (ref 3.5–5.0)
Alkaline Phosphatase: 131 U/L — ABNORMAL HIGH (ref 38–126)
Anion gap: 12 (ref 5–15)
BUN: <5 mg/dL — ABNORMAL LOW (ref 6–20)
CO2: 25 mmol/L (ref 22–32)
Calcium: 9 mg/dL (ref 8.9–10.3)
Chloride: 108 mmol/L (ref 98–111)
Creatinine, Ser: 0.52 mg/dL — ABNORMAL LOW (ref 0.61–1.24)
GFR calc Af Amer: >60 mL/min (ref >60)
GFR calc non Af Amer: >60 mL/min (ref >60)
Glucose, Bld: 92 mg/dL (ref 70–99)
Potassium: 3.8 mmol/L (ref 3.5–5.1)
Sodium: 145 mmol/L (ref 135–145)
Total Bilirubin: 1 mg/dL (ref 0.3–1.2)
Total Protein: 8.5 g/dL — ABNORMAL HIGH (ref 6.5–8.1)

## 2019-06-01 LAB — CBC
HCT: 40.9 % (ref 39.0–52.0)
Hemoglobin: 13.4 g/dL (ref 13.0–17.0)
MCH: 32 pg (ref 26.0–34.0)
MCHC: 32.8 g/dL (ref 30.0–36.0)
MCV: 97.6 fL (ref 80.0–100.0)
Platelets: 233 10*3/uL (ref 150–400)
RBC: 4.19 MIL/uL — ABNORMAL LOW (ref 4.22–5.81)
RDW: 12.6 % (ref 11.5–15.5)
WBC: 5.9 10*3/uL (ref 4.0–10.5)
nRBC: 0 % (ref 0.0–0.2)

## 2019-06-01 LAB — ETHANOL: Alcohol, Ethyl (B): 417 mg/dL (ref <10)

## 2019-06-01 MED ORDER — LORAZEPAM 2 MG PO TABS: 0.0000 mg | ORAL_TABLET | Freq: Two times a day (BID) | ORAL | Status: DC

## 2019-06-01 MED ORDER — LORAZEPAM 2 MG/ML IJ SOLN: 0.0000 mg | Freq: Four times a day (QID) | INTRAMUSCULAR | Status: DC

## 2019-06-01 MED ORDER — IBUPROFEN 600 MG PO TABS: 600.0000 mg | ORAL_TABLET | Freq: Three times a day (TID) | ORAL | Status: DC | PRN

## 2019-06-01 MED ORDER — ONDANSETRON HCL 4 MG PO TABS: 4.0000 mg | ORAL_TABLET | Freq: Three times a day (TID) | ORAL | Status: DC | PRN

## 2019-06-01 MED ORDER — LORAZEPAM 2 MG PO TABS: 0.0000 mg | ORAL_TABLET | Freq: Four times a day (QID) | ORAL | Status: DC

## 2019-06-01 MED ORDER — THIAMINE HCL 100 MG PO TABS: 100.0000 mg | ORAL_TABLET | Freq: Every day | ORAL | Status: DC

## 2019-06-01 MED ORDER — LORAZEPAM 2 MG/ML IJ SOLN: 0.0000 mg | Freq: Two times a day (BID) | INTRAMUSCULAR | Status: DC

## 2019-06-01 MED ORDER — THIAMINE HCL 100 MG/ML IJ SOLN: 100.0000 mg | Freq: Every day | INTRAMUSCULAR | Status: DC

## 2019-06-01 MED ORDER — ALUM & MAG HYDROXIDE-SIMETH 200-200-20 MG/5ML PO SUSP: 30.0000 mL | Freq: Four times a day (QID) | ORAL | Status: DC | PRN

## 2019-06-01 NOTE — BH Assessment (Signed)
[facility] Assessment Progress Note    TTS called to facilitate assessment process.  Jae Dire, RN,  states that patient is highly intoxicated (BAL 417) and is not able to participate in assessment at this time.

## 2019-06-01 NOTE — ED Notes (Signed)
Pt and staff made multiple attempts to get pt mom, or wife to pick up pt from ED. Pt states "they are made at me and wont come". Pt requesting to leave. Pt alert and oriented, steady gait, clear speech and does not appear intoxicated at this time. Pt given bus pass to obtain transportation home to [address] in [place].

## 2019-06-01 NOTE — ED Notes (Signed)
Pt stated he wanted to leave and his mom was going to come pick him up. No signs of DT or withdrawal. Informed EDP. This RN asked pt to sit on bed until family came to pick him up.

## 2019-06-01 NOTE — ED Notes (Signed)
Date and time results received: 06/01/19 1223   Test: Ethanol Critical Value: 417  Name of Provider Notified: Dr. Scotty Court

## 2019-06-01 NOTE — ED Notes (Signed)
Pt states he drinks a 6 pack of beer a day for last couple days. States he drank 2 beers this AM. Pt states he was out walking, clothes are wet and mud noted to them. States police brought him to ER. Pt is tearful. States he had to be admitted to hospital about 6 months ago for alcohol withdrawal. A&O, speaking in complete sentences. Denies SI or HI.

## 2019-06-01 NOTE — ED Triage Notes (Signed)
Pt found on side of road intoxicated. Pt brought in by PD for alcohol intoxication and detox. Pt reports drinking 6 pack beer daily. Denies drug or cigarette use. Denies SI/HI. Wants "AA" denies need for interpreter.

## 2019-06-01 NOTE — ED Notes (Signed)
Pt provided phone to let wife know he is at the ER.

## 2019-06-01 NOTE — ED Provider Notes (Signed)
Patient up to the nursing desk with steady gait no slurred speech states that he does not want to wait around to speak to TTS for rehab.  States he is dealt with this in the past would like to be discharged for outpatient treatment options.  Does not meet criteria for IVC.  He is clinically sober.  No signs of withdrawal.  He does have a safe ride home he is calling his mother for a safe ride home.   Willy Eddy, MD 06/01/19 1535

## 2019-06-01 NOTE — ED Notes (Signed)
VOL  

## 2019-06-01 NOTE — ED Notes (Signed)
Pt ambulatory with steady gait to bathroom. Informed of need for urine sample.

## 2019-06-01 NOTE — ED Provider Notes (Signed)
[facility] Emergency Department Provider Note  ____________________________________________  Time seen: Approximately 1:54 PM  I have reviewed the triage vital signs and the nursing notes.   HISTORY  Chief Complaint Alcohol Intoxication    HPI Brett Washington is a 39 y.o. male with a history of alcohol abuse and alcohol withdrawal seizures who is brought to the ED by police due to being found intoxicated on the side of the road.  Denies any drug use.  Denies any acute complaints such as chest pain shortness of breath trauma SI or HI.  Requests alcohol detox assessment.  Last drink was this morning.      Past Medical History:  Diagnosis Date  . Alcohol abuse   . Heart murmur   . Seizures [facility])      Patient Active Problem List   Diagnosis Date Noted  . LOC (loss of consciousness) (HCC) 01/11/2017  . Alcohol use disorder, severe, dependence (HCC) 11/06/2016  . Aspiration pneumonia due to vomit (HCC)   . Alcohol withdrawal seizure (HCC) 10/31/2016  . Thrombocytopenia (HCC) 10/31/2016  . Transaminitis 10/31/2016  . Hypokalemia 10/31/2016     Past Surgical History:  Procedure Laterality Date  . HAND SURGERY       Prior to Admission medications   Not on File     Allergies Patient has no known allergies.   Family History  Family history unknown: Yes    Social History Social History   Tobacco Use  . Smoking status: Never Smoker  . Smokeless tobacco: Never Used  Substance Use Topics  . Alcohol use: Yes    Alcohol/week: 56.0 standard drinks    Types: 56 Cans of beer per week  . Drug use: No    Review of Systems  Constitutional:   No fever or chills.  ENT:   No sore throat. No rhinorrhea. Cardiovascular:   No chest pain or syncope. Respiratory:   No dyspnea or cough. Gastrointestinal:   Negative for abdominal pain, vomiting and diarrhea.  Musculoskeletal:   Negative for focal pain or swelling All other systems reviewed and are  negative except as documented above in ROS and HPI.  ____________________________________________   PHYSICAL EXAM:  VITAL SIGNS: ED Triage Vitals [06/01/19 1139]  Enc Vitals Group     BP (!) 119/59     Pulse Rate 88     Resp 16     Temp 98.6 F (37 C)     Temp Source Oral     SpO2 100 %     Weight 170 lb (77.1 kg)     Height 5\' 6"  (1.676 m)     Head Circumference      Peak Flow      Pain Score 0     Pain Loc      Pain Edu?      Excl. in GC?     Vital signs reviewed, nursing assessments reviewed.   Constitutional:   Alert and oriented. Non-toxic appearance. Eyes:   Conjunctivae are normal. EOMI. PERRL. ENT      Head:   Normocephalic and atraumatic.      Nose:   Wearing a mask.      Mouth/Throat:   Wearing a mask.      Neck:   No meningismus. Full ROM. Hematological/Lymphatic/Immunilogical:   No cervical lymphadenopathy. Cardiovascular:   RRR. Symmetric bilateral radial and DP pulses.  No murmurs. Cap refill less than 2 seconds. Respiratory:   Normal respiratory effort without tachypnea/retractions. Breath sounds  are clear and equal bilaterally. No wheezes/rales/rhonchi. Gastrointestinal:   Soft and nontender. Non distended. There is no CVA tenderness.  No rebound, rigidity, or guarding. Musculoskeletal:   Normal range of motion in all extremities. No joint effusions.  No lower extremity tenderness.  No edema. Neurologic:   Normal speech and language.  Motor grossly intact. No acute focal neurologic deficits are appreciated.  Skin:    Skin is warm, dry and intact. No rash noted.  No petechiae, purpura, or bullae.  ____________________________________________    LABS (pertinent positives/negatives) (all labs ordered are listed, but only abnormal results are displayed) Labs Reviewed  COMPREHENSIVE METABOLIC PANEL - Abnormal; Notable for the following components:      Result Value   BUN <5 (*)    Creatinine, Ser 0.52 (*)    Total Protein 8.5 (*)    AST 81 (*)     Alkaline Phosphatase 131 (*)    All other components within normal limits  ETHANOL - Abnormal; Notable for the following components:   Alcohol, Ethyl (B) 417 (*)    All other components within normal limits  CBC - Abnormal; Notable for the following components:   RBC 4.19 (*)    All other components within normal limits  URINE DRUG SCREEN, QUALITATIVE ([facility] ONLY)   ____________________________________________   EKG    ____________________________________________    RADIOLOGY  No results found.  ____________________________________________   PROCEDURES Procedures  ____________________________________________    CLINICAL IMPRESSION / ASSESSMENT AND PLAN / ED COURSE  Medications ordered in the ED: Medications - No data to display  Pertinent labs & imaging results that were available during my care of the patient were reviewed by me and considered in my medical decision making (see chart for details).  Brett Washington was evaluated in Emergency Department on 06/01/2019 for the symptoms described in the history of present illness. He was evaluated in the context of the global COVID-19 pandemic, which necessitated consideration that the patient might be at risk for infection with the SARS-CoV-2 virus that causes COVID-19. Institutional protocols and algorithms that pertain to the evaluation of patients at risk for COVID-19 are in a state of rapid change based on information released by regulatory bodies including the CDC and federal and state organizations. These policies and algorithms were followed during the patient's care in the ED.   Presents with alcohol intoxication.  No signs of significant trauma.  Vital signs are normal.  Check labs for signs of acidosis or significant dehydration.  Clinical Course as of May 31 1352  Tue Jun 01, 2019  1227 Ambulatory with steady gait. Etoh level > 400. Will c/s TTS for possible alc abuse tx placement.   [PS]    Clinical Course User  Index [PS] Carrie Mew, MD    ----------------------------------------- 1:56 PM on 06/01/2019 -----------------------------------------  Labs unremarkable.  We will follow-up TTS evaluation.   ____________________________________________   FINAL CLINICAL IMPRESSION(S) / ED DIAGNOSES    Final diagnoses:  Alcoholic intoxication without complication [facility])     ED Discharge Orders    None      Portions of this note were generated with dragon dictation software. Dictation errors may occur despite best attempts at proofreading.   Carrie Mew, MD 06/01/19 1356

## 2019-06-02 ENCOUNTER — Emergency Department: Payer: Self-pay

## 2019-06-02 ENCOUNTER — Other Ambulatory Visit: Payer: Self-pay

## 2019-06-02 ENCOUNTER — Inpatient Hospital Stay
Admission: EM | Admit: 2019-06-02 | Discharge: 2019-06-04 | DRG: 922 | Disposition: A | Discharge status: Home / Self Care | Payer: Self-pay | Attending: Internal Medicine | Admitting: Internal Medicine

## 2019-06-02 DIAGNOSIS — E860 Dehydration: Secondary | ICD-10-CM | POA: Diagnosis present

## 2019-06-02 DIAGNOSIS — T68XXXA Hypothermia, initial encounter: Principal | ICD-10-CM | POA: Diagnosis present

## 2019-06-02 DIAGNOSIS — S60512A Abrasion of left hand, initial encounter: Secondary | ICD-10-CM | POA: Diagnosis present

## 2019-06-02 DIAGNOSIS — Y908 Blood alcohol level of 240 mg/100 ml or more: Secondary | ICD-10-CM | POA: Diagnosis present

## 2019-06-02 DIAGNOSIS — D6959 Other secondary thrombocytopenia: Secondary | ICD-10-CM | POA: Diagnosis present

## 2019-06-02 DIAGNOSIS — F10921 Alcohol use, unspecified with intoxication delirium: Secondary | ICD-10-CM

## 2019-06-02 DIAGNOSIS — U071 COVID-19: Secondary | ICD-10-CM | POA: Diagnosis present

## 2019-06-02 DIAGNOSIS — I959 Hypotension, unspecified: Secondary | ICD-10-CM | POA: Diagnosis present

## 2019-06-02 DIAGNOSIS — M6282 Rhabdomyolysis: Secondary | ICD-10-CM | POA: Diagnosis present

## 2019-06-02 DIAGNOSIS — E872 Acidosis: Secondary | ICD-10-CM | POA: Diagnosis present

## 2019-06-02 DIAGNOSIS — X31XXXA Exposure to excessive natural cold, initial encounter: Secondary | ICD-10-CM

## 2019-06-02 DIAGNOSIS — F1020 Alcohol dependence, uncomplicated: Secondary | ICD-10-CM

## 2019-06-02 DIAGNOSIS — D696 Thrombocytopenia, unspecified: Secondary | ICD-10-CM | POA: Diagnosis present

## 2019-06-02 DIAGNOSIS — R7401 Elevation of levels of liver transaminase levels: Secondary | ICD-10-CM | POA: Diagnosis present

## 2019-06-02 DIAGNOSIS — S60511A Abrasion of right hand, initial encounter: Secondary | ICD-10-CM | POA: Diagnosis present

## 2019-06-02 DIAGNOSIS — F10221 Alcohol dependence with intoxication delirium: Secondary | ICD-10-CM | POA: Diagnosis present

## 2019-06-02 DIAGNOSIS — I4581 Long QT syndrome: Secondary | ICD-10-CM | POA: Diagnosis present

## 2019-06-02 DIAGNOSIS — R569 Unspecified convulsions: Secondary | ICD-10-CM | POA: Diagnosis present

## 2019-06-02 LAB — CBC
HCT: 34.8 % — ABNORMAL LOW (ref 39.0–52.0)
Hemoglobin: 11.6 g/dL — ABNORMAL LOW (ref 13.0–17.0)
MCH: 31.8 pg (ref 26.0–34.0)
MCHC: 33.3 g/dL (ref 30.0–36.0)
MCV: 95.3 fL (ref 80.0–100.0)
Platelets: 181 10*3/uL (ref 150–400)
RBC: 3.65 MIL/uL — ABNORMAL LOW (ref 4.22–5.81)
RDW: 12.6 % (ref 11.5–15.5)
WBC: 10.7 10*3/uL — ABNORMAL HIGH (ref 4.0–10.5)
nRBC: 0 % (ref 0.0–0.2)

## 2019-06-02 LAB — COMPREHENSIVE METABOLIC PANEL
ALT: 39 U/L (ref 0–44)
AST: 121 U/L — ABNORMAL HIGH (ref 15–41)
Albumin: 3.9 g/dL (ref 3.5–5.0)
Alkaline Phosphatase: 134 U/L — ABNORMAL HIGH (ref 38–126)
Anion gap: 19 — ABNORMAL HIGH (ref 5–15)
BUN: 8 mg/dL (ref 6–20)
CO2: 18 mmol/L — ABNORMAL LOW (ref 22–32)
Calcium: 8.8 mg/dL — ABNORMAL LOW (ref 8.9–10.3)
Chloride: 105 mmol/L (ref 98–111)
Creatinine, Ser: 0.39 mg/dL — ABNORMAL LOW (ref 0.61–1.24)
GFR calc Af Amer: >60 mL/min (ref >60)
GFR calc non Af Amer: >60 mL/min (ref >60)
Glucose, Bld: 119 mg/dL — ABNORMAL HIGH (ref 70–99)
Potassium: 3.6 mmol/L (ref 3.5–5.1)
Sodium: 142 mmol/L (ref 135–145)
Total Bilirubin: 1.1 mg/dL (ref 0.3–1.2)
Total Protein: 8.6 g/dL — ABNORMAL HIGH (ref 6.5–8.1)

## 2019-06-02 LAB — CBC WITH DIFFERENTIAL/PLATELET
Abs Immature Granulocytes: 0.04 10*3/uL (ref 0.00–0.07)
Basophils Absolute: 0 10*3/uL (ref 0.0–0.1)
Basophils Relative: 0 %
Eosinophils Absolute: 0 10*3/uL (ref 0.0–0.5)
Eosinophils Relative: 0 %
HCT: 41.5 % (ref 39.0–52.0)
Hemoglobin: 13.9 g/dL (ref 13.0–17.0)
Immature Granulocytes: 0 %
Lymphocytes Relative: 15 %
Lymphs Abs: 1.5 10*3/uL (ref 0.7–4.0)
MCH: 32.3 pg (ref 26.0–34.0)
MCHC: 33.5 g/dL (ref 30.0–36.0)
MCV: 96.3 fL (ref 80.0–100.0)
Monocytes Absolute: 0.4 10*3/uL (ref 0.1–1.0)
Monocytes Relative: 4 %
Neutro Abs: 7.7 10*3/uL (ref 1.7–7.7)
Neutrophils Relative %: 81 %
Platelets: 252 10*3/uL (ref 150–400)
RBC: 4.31 MIL/uL (ref 4.22–5.81)
RDW: 12.4 % (ref 11.5–15.5)
WBC: 9.6 10*3/uL (ref 4.0–10.5)
nRBC: 0 % (ref 0.0–0.2)

## 2019-06-02 LAB — URINE DRUG SCREEN, QUALITATIVE (ARMC ONLY)
Amphetamines, Ur Screen: NOT DETECTED
Barbiturates, Ur Screen: NOT DETECTED
Benzodiazepine, Ur Scrn: NOT DETECTED
Cannabinoid 50 Ng, Ur ~~LOC~~: NOT DETECTED
Cocaine Metabolite,Ur ~~LOC~~: NOT DETECTED
MDMA (Ecstasy)Ur Screen: NOT DETECTED
Methadone Scn, Ur: NOT DETECTED
Opiate, Ur Screen: NOT DETECTED
Phencyclidine (PCP) Ur S: NOT DETECTED
Tricyclic, Ur Screen: NOT DETECTED

## 2019-06-02 LAB — PROTIME-INR
INR: 1.3 — ABNORMAL HIGH (ref 0.8–1.2)
Prothrombin Time: 15.7 seconds — ABNORMAL HIGH (ref 11.4–15.2)

## 2019-06-02 LAB — MRSA PCR SCREENING: MRSA by PCR: NEGATIVE

## 2019-06-02 LAB — RESPIRATORY PANEL BY RT PCR (FLU A&B, COVID)
Influenza A by PCR: NEGATIVE
Influenza B by PCR: NEGATIVE
SARS Coronavirus 2 by RT PCR: POSITIVE — AB

## 2019-06-02 LAB — URINALYSIS, COMPLETE (UACMP) WITH MICROSCOPIC
Bacteria, UA: NONE SEEN
Bilirubin Urine: NEGATIVE
Glucose, UA: NEGATIVE mg/dL
Hgb urine dipstick: NEGATIVE
Ketones, ur: 5 mg/dL — AB
Leukocytes,Ua: NEGATIVE
Nitrite: NEGATIVE
Protein, ur: NEGATIVE mg/dL
Specific Gravity, Urine: 1.006 (ref 1.005–1.030)
pH: 6 (ref 5.0–8.0)

## 2019-06-02 LAB — MAGNESIUM
Magnesium: 2.5 mg/dL — ABNORMAL HIGH (ref 1.7–2.4)
Magnesium: 1.8 mg/dL (ref 1.7–2.4)

## 2019-06-02 LAB — ETHANOL: Alcohol, Ethyl (B): 344 mg/dL (ref <10)

## 2019-06-02 LAB — CK: Total CK: 760 U/L — ABNORMAL HIGH (ref 49–397)

## 2019-06-02 LAB — GLUCOSE, CAPILLARY: Glucose-Capillary: 76 mg/dL (ref 70–99)

## 2019-06-02 LAB — HIV ANTIBODY (ROUTINE TESTING W REFLEX): HIV Screen 4th Generation wRfx: NONREACTIVE

## 2019-06-02 LAB — FERRITIN: Ferritin: 46 ng/mL (ref 24–336)

## 2019-06-02 LAB — FIBRIN DERIVATIVES D-DIMER (ARMC ONLY): Fibrin derivatives D-dimer (ARMC): 2557.67 ng/mL (FEU) — ABNORMAL HIGH (ref 0.00–499.00)

## 2019-06-02 LAB — PHOSPHORUS: Phosphorus: 3.9 mg/dL (ref 2.5–4.6)

## 2019-06-02 MED ORDER — LORAZEPAM 2 MG/ML IJ SOLN: 2.0000 mg | INTRAMUSCULAR | Status: DC | PRN

## 2019-06-02 MED ORDER — ADULT MULTIVITAMIN W/MINERALS CH
1.0000 | ORAL_TABLET | Freq: Every day | ORAL | Status: DC
  Administered 2019-06-03 – 2019-06-04 (×2): 1 via ORAL
  Filled 2019-06-02 (×2): qty 1

## 2019-06-02 MED ORDER — THIAMINE HCL 100 MG/ML IJ SOLN
100.0000 mg | Freq: Every day | INTRAMUSCULAR | Status: DC
  Administered 2019-06-03: 09:00:00 100 mg via INTRAVENOUS
  Filled 2019-06-02: qty 2

## 2019-06-02 MED ORDER — CHLORHEXIDINE GLUCONATE CLOTH 2 % EX PADS
6.0000 | MEDICATED_PAD | Freq: Every day | CUTANEOUS | Status: DC
  Administered 2019-06-03 – 2019-06-04 (×2): 6 via TOPICAL

## 2019-06-02 MED ORDER — THIAMINE HCL 100 MG PO TABS
100.0000 mg | ORAL_TABLET | Freq: Every day | ORAL | Status: DC
  Administered 2019-06-04: 100 mg via ORAL
  Filled 2019-06-02 (×2): qty 1

## 2019-06-02 MED ORDER — FOLIC ACID 1 MG PO TABS
1.0000 mg | ORAL_TABLET | Freq: Every day | ORAL | Status: DC
  Administered 2019-06-03 – 2019-06-04 (×2): 1 mg via ORAL
  Filled 2019-06-02 (×2): qty 1

## 2019-06-02 MED ORDER — SODIUM CHLORIDE 0.9 % IV SOLN: INTRAVENOUS | Status: AC

## 2019-06-02 MED ORDER — LORAZEPAM 2 MG/ML IJ SOLN: 2.0000 mg | Freq: Once | INTRAMUSCULAR | Status: AC

## 2019-06-02 MED ORDER — ENOXAPARIN SODIUM 40 MG/0.4ML ~~LOC~~ SOLN
40.0000 mg | SUBCUTANEOUS | Status: DC
  Administered 2019-06-02 – 2019-06-03 (×2): 40 mg via SUBCUTANEOUS
  Filled 2019-06-02 (×2): qty 0.4

## 2019-06-02 MED ORDER — LORAZEPAM 2 MG/ML IJ SOLN: 1.0000 mg | INTRAMUSCULAR | Status: DC | PRN

## 2019-06-02 MED ORDER — LACTATED RINGERS IV BOLUS: 1000.0000 mL | Freq: Once | INTRAVENOUS | Status: DC

## 2019-06-02 MED ORDER — ACETAMINOPHEN 650 MG RE SUPP: 650.0000 mg | Freq: Four times a day (QID) | RECTAL | Status: DC | PRN

## 2019-06-02 MED ORDER — ACETAMINOPHEN 325 MG PO TABS: 650.0000 mg | ORAL_TABLET | Freq: Four times a day (QID) | ORAL | Status: DC | PRN

## 2019-06-02 MED ORDER — SODIUM CHLORIDE 0.9 % IV BOLUS
1000.0000 mL | Freq: Once | INTRAVENOUS | Status: AC
  Administered 2019-06-02: 10:00:00 1000 mL via INTRAVENOUS

## 2019-06-02 MED ORDER — LORAZEPAM 1 MG PO TABS: 1.0000 mg | ORAL_TABLET | ORAL | Status: DC | PRN

## 2019-06-02 MED ORDER — SODIUM CHLORIDE 0.9 % IV BOLUS
1000.0000 mL | Freq: Once | INTRAVENOUS | Status: AC
  Administered 2019-06-02: 11:00:00 1000 mL via INTRAVENOUS

## 2019-06-02 MED ORDER — LORAZEPAM 2 MG/ML IJ SOLN
INTRAMUSCULAR | Status: AC
  Administered 2019-06-02: 2 mg via INTRAVENOUS
  Filled 2019-06-02: qty 1

## 2019-06-02 MED ORDER — THIAMINE HCL 100 MG/ML IJ SOLN
Freq: Once | INTRAVENOUS | Status: AC
  Filled 2019-06-02: qty 1000

## 2019-06-02 NOTE — ED Notes (Signed)
Pt spouse contacted at this time to provide update.

## 2019-06-02 NOTE — ED Notes (Signed)
Bair hugger turned off due to temp being 99 but blanket left covering pt in case it needs to be used in future.

## 2019-06-02 NOTE — ED Triage Notes (Signed)
Pt arrived via ems from outside of [facility]. Unsure how long pt was outside. Ems states pt was shivering really bad. Blood on left hand looks like pt may have fallen. Alert on arrival. Skin cold on arrival and still shivering. Pt delayed when attempting to follow commands. MD present for assessment.  Ems vitals 152/114 99% room  92 HR

## 2019-06-02 NOTE — ED Provider Notes (Signed)
[facility] Emergency Department Provider Note  ____________________________________________   First MD Initiated Contact with Patient 06/02/19 0848     (approximate)  I have reviewed the triage vital signs and the nursing notes.   HISTORY  Chief Complaint Alcohol Intoxication    HPI Brett Washington is a 39 y.o. male with history of chronic alcohol abuse, just seen here yesterday for the same, here with altered mental status.  The patient was reportedly found down, outside.  He was confused.  It is unknown how long he was down.  He had superficial abrasions to the hands.  On my assessment, the patient is moderately confused.  He states that he did drink since he left, but then states he just passed out.  He is unsure how he ended up outside or how long he was down.  He is unable to provide much additional history other than this.  He does feel like he had a seizure per report, though is unable to further elaborate.  Level 5 caveat invoked as remainder of history, ROS, and physical exam limited due to patient's mental status.         Past Medical History:  Diagnosis Date  . Alcohol abuse   . Heart murmur   . Seizures [facility])     Patient Active Problem List   Diagnosis Date Noted  . Hypothermia 06/02/2019  . LOC (loss of consciousness) (HCC) 01/11/2017  . Alcohol use disorder, severe, dependence (HCC) 11/06/2016  . Aspiration pneumonia due to vomit (HCC)   . Alcohol withdrawal seizure (HCC) 10/31/2016  . Thrombocytopenia (HCC) 10/31/2016  . Transaminitis 10/31/2016  . Hypokalemia 10/31/2016    Past Surgical History:  Procedure Laterality Date  . HAND SURGERY      Prior to Admission medications   Not on File    Allergies Patient has no known allergies.  Family History  Family history unknown: Yes    Social History Social History   Tobacco Use  . Smoking status: Never Smoker  . Smokeless tobacco: Never Used  Substance Use Topics  .  Alcohol use: Yes    Alcohol/week: 56.0 standard drinks    Types: 56 Cans of beer per week  . Drug use: No    Review of Systems  Review of Systems  Unable to perform ROS: Mental status change     ____________________________________________  PHYSICAL EXAM:      VITAL SIGNS: ED Triage Vitals  Enc Vitals Group     BP      Pulse      Resp      Temp      Temp src      SpO2      Weight      Height      Head Circumference      Peak Flow      Pain Score      Pain Loc      Pain Edu?      Excl. in GC?      Physical Exam Vitals and nursing note reviewed.  Constitutional:      General: He is not in acute distress.    Appearance: He is well-developed. He is ill-appearing.     Comments: Disheveled, cold to the touch, confused, ill-appearing  HENT:     Head: Normocephalic and atraumatic.     Comments: Markedly dry mucous membranes    Mouth/Throat:     Mouth: Mucous membranes are dry.  Eyes:  Conjunctiva/sclera: Conjunctivae normal.  Cardiovascular:     Rate and Rhythm: Regular rhythm. Tachycardia present.     Heart sounds: Normal heart sounds. No murmur. No friction rub.  Pulmonary:     Effort: Pulmonary effort is normal. No respiratory distress.     Breath sounds: Normal breath sounds. No wheezing or rales.  Abdominal:     General: There is no distension.     Palpations: Abdomen is soft.     Tenderness: There is no abdominal tenderness.  Musculoskeletal:     Cervical back: Neck supple.  Skin:    General: Skin is warm.     Capillary Refill: Capillary refill takes less than 2 seconds.  Neurological:     Mental Status: He is alert.     Motor: No abnormal muscle tone.     Comments: Disoriented, oriented to person and place but not time.  Coarse, severe tremor.  No obvious cranial nerve deficits.       ____________________________________________   LABS (all labs ordered are listed, but only abnormal results are displayed)  Labs Reviewed  COMPREHENSIVE  METABOLIC PANEL - Abnormal; Notable for the following components:      Result Value   CO2 18 (*)    Glucose, Bld 119 (*)    Creatinine, Ser 0.39 (*)    Calcium 8.8 (*)    Total Protein 8.6 (*)    AST 121 (*)    Alkaline Phosphatase 134 (*)    Anion gap 19 (*)    All other components within normal limits  CK - Abnormal; Notable for the following components:   Total CK 760 (*)    All other components within normal limits  URINALYSIS, COMPLETE (UACMP) WITH MICROSCOPIC - Abnormal; Notable for the following components:   Color, Urine YELLOW (*)    APPearance CLEAR (*)    Ketones, ur 5 (*)    All other components within normal limits  MAGNESIUM - Abnormal; Notable for the following components:   Magnesium 2.5 (*)    All other components within normal limits  ETHANOL - Abnormal; Notable for the following components:   Alcohol, Ethyl (B) 344 (*)    All other components within normal limits  CBC WITH DIFFERENTIAL/PLATELET  URINE DRUG SCREEN, QUALITATIVE ([facility] ONLY)    ____________________________________________  EKG: Significantly limited by artifact from shaking, but appears NSR rate 97. QRS 146, QTc 545.  ________________________________________  RADIOLOGY All imaging, including plain films, CT scans, and ultrasounds, independently reviewed by me, and interpretations confirmed via formal radiology reads.  ED MD interpretation:   CT Head: NAICA CXR: Clear  Official radiology report(s): CT Head Wo Contrast  Result Date: 06/02/2019 CLINICAL DATA:  Unwitnessed fall with altered mental status EXAM: CT HEAD WITHOUT CONTRAST TECHNIQUE: Contiguous axial images were obtained from the base of the skull through the vertex without intravenous contrast. COMPARISON:  April 29, 2019 FINDINGS: Brain: Mild atrophy for age is stable. There is no intracranial mass, hemorrhage, extra-axial fluid collection, or midline shift. The brain parenchyma appears unremarkable. No acute infarct is  demonstrable. Vascular: No hyperdense vessel.  No evident vascular calcification. Skull: The bony calvarium appears intact. Sinuses/Orbits: There remains mild mucosal thickening in several ethmoid air cells. Other visualized paranasal sinuses are clear. Orbits appear symmetric bilaterally. Other: Visualized mastoid air cells are clear. IMPRESSION: 1. Stable mild atrophy. Brain parenchyma appears unremarkable. No demonstrable mass or hemorrhage. 2.  Stable mild mucosal thickening in several ethmoid air cells. Electronically Signed   By: Gwyndolyn Saxon  Margarita Grizzle III M.D.   On: 06/02/2019 10:32   DG Chest Portable 1 View  Result Date: 06/02/2019 CLINICAL DATA:  Pain following fall EXAM: PORTABLE CHEST 1 VIEW COMPARISON:  November 05, 2016 FINDINGS: Lungs are clear. Heart is slightly enlarged with pulmonary vascularity normal. No adenopathy. No bone lesions. No pneumothorax. IMPRESSION: Slight cardiomegaly.  Lungs clear.  No adenopathy. Electronically Signed   By: Bretta Bang III M.D.   On: 06/02/2019 09:58    ____________________________________________  PROCEDURES   Procedure(s) performed (including Critical Care):  Procedures  ____________________________________________  INITIAL IMPRESSION / MDM / ASSESSMENT AND PLAN / ED COURSE  As part of my medical decision making, I reviewed the following data within the electronic MEDICAL RECORD NUMBER Nursing notes reviewed and incorporated, Old chart reviewed, Notes from prior ED visits, and [place] Controlled Substance Database       *Brett Washington was evaluated in Emergency Department on 06/02/2019 for the symptoms described in the history of present illness. He was evaluated in the context of the global COVID-19 pandemic, which necessitated consideration that the patient might be at risk for infection with the SARS-CoV-2 virus that causes COVID-19. Institutional protocols and algorithms that pertain to the evaluation of patients at risk for COVID-19 are in a state  of rapid change based on information released by regulatory bodies including the CDC and federal and state organizations. These policies and algorithms were followed during the patient's care in the ED.  Some ED evaluations and interventions may be delayed as a result of limited staffing during the pandemic.*     Medical Decision Making:  39 yo M here with altered mental status.  On arrival, patient severely hypothermic and altered.  He was placed on Bair hugger and warmed fluids.  No hypotension or evidence of hemodynamic instability.  2 L normal saline given for intravascular resuscitation, followed by banana bag with thiamine given his chronic alcohol dependence.  Lab work is overall reassuring.  CBC unremarkable.  He has a mild anion gap metabolic acidosis which I suspect is alcoholic ketoacidosis.  Bicarb 18 likely due to dehydration.  CK minimally elevated.  CT head obtained and is negative.  Chest x-ray without aspiration.  Admit to medicine.  ____________________________________________  FINAL CLINICAL IMPRESSION(S) / ED DIAGNOSES  Final diagnoses:  Hypothermia, initial encounter  Alcohol intoxication with delirium (HCC)     MEDICATIONS GIVEN DURING THIS VISIT:  Medications  LORazepam (ATIVAN) injection 2 mg (2 mg Intravenous Given 06/02/19 0914)  sodium chloride 0.9 % bolus 1,000 mL (0 mLs Intravenous Stopped 06/02/19 1050)  sodium chloride 0.9 % 1,000 mL with thiamine 100 mg, folic acid 1 mg, multivitamins adult 10 mL infusion ( Intravenous New Bag/Given 06/02/19 1134)  sodium chloride 0.9 % bolus 1,000 mL (0 mLs Intravenous Stopped 06/02/19 1144)     ED Discharge Orders    None       Note:  This document was prepared using Dragon voice recognition software and may include unintentional dictation errors.   Shaune Pollack, MD 06/02/19 1306

## 2019-06-02 NOTE — ED Notes (Signed)
Pt states "its coming" referring to seizure. Arms started contracting

## 2019-06-02 NOTE — ED Notes (Signed)
Bair hugger placed on pt at this time due to temp of 87.7 rectal

## 2019-06-02 NOTE — H&P (Addendum)
TRH H&P   Patient Demographics:    Brett Washington, is a 39 y.o. male  MRN: [id number]   DOB - [birth year]  Admit Date - 06/02/2019  Outpatient Primary MD for the patient is [facility]  Referring MD: Dr. Erma Heritage  Outpatient Specialists: None  Patient coming from: Home  Chief Complaint  Patient presents with  . Alcohol Intoxication      HPI:    Brett Washington  is a 39 y.o. male, with chronic alcohol abuse, recurrent ED visits was seen here yesterday with alcohol intoxication.  He had significantly high alcohol level.  Offered speaking with social work for rehab option but refused to stay and wished for outpatient treatment options.  He was brought to the ED after being found down outside, confused.  It is unclear how long he was down for.  Noted for superficial abrasion in his hand.  Patient informed the ED that he did drink since he left but passed out for unknown duration.  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 In the ED patient was significantly hypothermic with a rectal temperature of 87 F. Had low blood pressure with systolic in the 70s.  Blood work showed elevated AST and ALT in the 100s with anion gap of 19, CK of 760.  Magnesium of 2.5 and alcohol level of 344.  UA and urine drug screen was unremarkable. Hypothermia protocol initiated and patient applied Bair hugger and 2 L Ringer's lactate given.  Temperature started to improve.  Patient however was still sedated.  Hospitalist consulted for admission to stepdown unit.  Head CT and chest x-ray were unremarkable for any acute findings.  Patient was tested for COVID-19 by PCR yesterday afternoon (surprisingly the result was not reported until this afternoon and came back as tested positive.      Review of systems:    In addition to the HPI  above, \ As outlined in HPI.  ROS limited due to patient's encephalopathy.   With Past History of the following :    Past Medical History:  Diagnosis Date  . Alcohol abuse   . Heart murmur   . Seizures (HCC)       Past Surgical History:  Procedure Laterality Date  . HAND SURGERY        Social History:     Social History   Tobacco Use  . Smoking status: Never Smoker  . Smokeless tobacco: Never Used  Substance Use Topics  . Alcohol use: Yes    Alcohol/week: 56.0 standard drinks    Types: 56 Cans of beer per week     Lives -alone  Mobility -  Independent   Family History :     Family History  Family history unknown: Yes   Unable to obtain any significant family history.   Home Medications:  Prior to Admission medications   Not on File     Allergies:    No Known Allergies   Physical Exam:   Vitals  Blood pressure 113/64, pulse (!) 118, temperature 99 F (37.2 C), resp. rate 20, height 5\' 9"  (1.753 m), weight 77.1 kg, SpO2 96 %.   General: Middle-aged male lying in bed, somnolent, arousable to command but noncommunicative HEENT: Pupils reactive bilaterally, EOMI, no pallor, dry mucosa, supple neck, no cervical lymphadenopathy Chest: Clear to auscultation bilaterally CVs: Normal S1-S2, no murmurs rub or gallop GI: Soft, nondistended, nontender, bowel sounds present Musculoskeletal: Warm, no edema CNS: Somnolent, arousable to command, poorly communicative, no tremors, moves all extremities    Data Review:    CBC Recent Labs  Lab 06/01/19 1141 06/02/19 0922  WBC 5.9 9.6  HGB 13.4 13.9  HCT 40.9 41.5  PLT 233 252  MCV 97.6 96.3  MCH 32.0 32.3  MCHC 32.8 33.5  RDW 12.6 12.4  LYMPHSABS  --  1.5  MONOABS  --  0.4  EOSABS  --  0.0  BASOSABS  --  0.0   ------------------------------------------------------------------------------------------------------------------  Chemistries  Recent Labs  Lab 06/01/19 1141 06/02/19 0922  NA  145 142  K 3.8 3.6  CL 108 105  CO2 25 18*  GLUCOSE 92 119*  BUN <5* 8  CREATININE 0.52* 0.39*  CALCIUM 9.0 8.8*  MG  --  2.5*  AST 81* 121*  ALT 32 39  ALKPHOS 131* 134*  BILITOT 1.0 1.1   ------------------------------------------------------------------------------------------------------------------ estimated creatinine clearance is 126.4 mL/min (A) (by C-G formula based on SCr of 0.39 mg/dL (L)). ------------------------------------------------------------------------------------------------------------------ No results for input(s): TSH, T4TOTAL, T3FREE, THYROIDAB in the last 72 hours.  Invalid input(s): FREET3  Coagulation profile No results for input(s): INR, PROTIME in the last 168 hours. ------------------------------------------------------------------------------------------------------------------- No results for input(s): DDIMER in the last 72 hours. -------------------------------------------------------------------------------------------------------------------  Cardiac Enzymes No results for input(s): CKMB, TROPONINI, MYOGLOBIN in the last 168 hours.  Invalid input(s): CK ------------------------------------------------------------------------------------------------------------------ No results found for: BNP   ---------------------------------------------------------------------------------------------------------------  Urinalysis    Component Value Date/Time   COLORURINE YELLOW (A) 06/02/2019 0922   APPEARANCEUR CLEAR (A) 06/02/2019 0922   APPEARANCEUR Clear 11/11/2013 1122   LABSPEC 1.006 06/02/2019 0922   LABSPEC 1.001 11/11/2013 1122   PHURINE 6.0 06/02/2019 0922   GLUCOSEU NEGATIVE 06/02/2019 0922   GLUCOSEU Negative 11/11/2013 1122   HGBUR NEGATIVE 06/02/2019 0922   BILIRUBINUR NEGATIVE 06/02/2019 0922   BILIRUBINUR Negative 11/11/2013 1122   KETONESUR 5 (A) 06/02/2019 0922   PROTEINUR NEGATIVE 06/02/2019 0922   NITRITE NEGATIVE  06/02/2019 0922   LEUKOCYTESUR NEGATIVE 06/02/2019 0922   LEUKOCYTESUR Negative 11/11/2013 1122    ----------------------------------------------------------------------------------------------------------------   Imaging Results:    CT Head Wo Contrast  Result Date: 06/02/2019 CLINICAL DATA:  Unwitnessed fall with altered mental status EXAM: CT HEAD WITHOUT CONTRAST TECHNIQUE: Contiguous axial images were obtained from the base of the skull through the vertex without intravenous contrast. COMPARISON:  April 29, 2019 FINDINGS: Brain: Mild atrophy for age is stable. There is no intracranial mass, hemorrhage, extra-axial fluid collection, or midline shift. The brain parenchyma appears unremarkable. No acute infarct is demonstrable. Vascular: No hyperdense vessel.  No evident vascular calcification. Skull: The bony calvarium appears intact. Sinuses/Orbits: There remains mild mucosal thickening in several ethmoid air cells. Other visualized paranasal sinuses are clear. Orbits appear symmetric bilaterally. Other: Visualized mastoid air cells are clear. IMPRESSION: 1. Stable mild atrophy. Brain parenchyma appears unremarkable. No demonstrable mass  or hemorrhage. 2.  Stable mild mucosal thickening in several ethmoid air cells. Electronically Signed   By: Lowella Grip III M.D.   On: 06/02/2019 10:32   DG Chest Portable 1 View  Result Date: 06/02/2019 CLINICAL DATA:  Pain following fall EXAM: PORTABLE CHEST 1 VIEW COMPARISON:  November 05, 2016 FINDINGS: Lungs are clear. Heart is slightly enlarged with pulmonary vascularity normal. No adenopathy. No bone lesions. No pneumothorax. IMPRESSION: Slight cardiomegaly.  Lungs clear.  No adenopathy. Electronically Signed   By: Lowella Grip III M.D.   On: 06/02/2019 09:58    My personal review of EKG: Artifact rhythm with prolonged QTC of 545.  Assessment & Plan:   Principal problem Severe sepsis with hypothermia, presumed noninfectious Likely due to  cold environment.  Admit to stepdown.  Hypothermia protocol initiated and temperature improving to normal with bear hugger applied and IV hydration.  Banana bag initiated in the ED. Continue neurochecks.  Patient slowly waking up during this afternoon.  Will monitor for now.   Active Problems: Alcohol intoxication with delirium ([place]) Ongoing severe alcohol use with transaminitis and chronic thrombocytopenia.  High risk for withdrawal seizures and DTs.  Monitor on CIWA.  IV hydration.  Seizure precautions.  Added thiamine, folate and multivitamin. Social work consult.  COVID-19 infection Was tested yesterday and reported only this afternoon.  No respiratory symptoms.  Check fibrin derivative D-dimer, CRP and ferritin.  Maintain airborne and contact precautions. Maintaining sats on room air.  Given his acute encephalopathy and severe alcohol intoxication without respiratory symptoms he will be admitted here for now and if has any respiratory symptoms will determine transfer to [facility] during the hospital stay.  Acute metabolic encephalopathy with acute metabolic acidosis Secondary to alcohol intoxication and alcoholic ketoacidosis.  Mental status slowly improving.  Continue neurochecks with seizure precaution.  IV hydration and monitor electrolytes including K and MG.  Acute rhabdomyolysis CK in 700s.  Cannot rule out alcohol withdrawal seizures.  Monitor with IV hydration.  Prolonged QTC Monitor K and mag and replenished.  Monitor on telemetry.  Transaminitis Secondary to alcohol use.  Monitor for now.  Thrombocytopenia Chronic with alcohol use     DVT Prophylaxis subcu Lovenox  AM Labs Ordered, also please review Full Orders  Family Communication: None  Code Status full code  Likely DC to home  Condition GUARDED    Consults called: None  Admission status: Inpatient It is my opinion that patient is presenting with severe hypothermia and hypotension, with metabolic acidosis,  acute metabolic encephalopathy and transaminitis.  He is at high risk for worsening sepsis, alcohol withdrawal with delirium tremens and seizures and worsened metabolic acidosis and encephalopathy.  Patient needs aggressive IV hydration, IV benzodiazepines, close monitoring in the stepdown unit for his hypothermia and encephalopathy with high risk for clinical decompensation and complication associated with alcohol withdrawal and associated COVID-19 infection.  For this he needs to be monitored in the inpatient setting for at least >2 midnight.  Time spent in minutes : 70   Geraldy Akridge M.D on 06/02/2019 at 4:45 PM  Between 7am to 7pm - Pager - [phone]. After 7pm go to [email or web address] - password [facility]  [facility] - Office  [phone]

## 2019-06-03 DIAGNOSIS — U071 COVID-19: Secondary | ICD-10-CM

## 2019-06-03 DIAGNOSIS — T68XXXA Hypothermia, initial encounter: Principal | ICD-10-CM

## 2019-06-03 DIAGNOSIS — F10921 Alcohol use, unspecified with intoxication delirium: Secondary | ICD-10-CM

## 2019-06-03 DIAGNOSIS — F1020 Alcohol dependence, uncomplicated: Secondary | ICD-10-CM

## 2019-06-03 LAB — COMPREHENSIVE METABOLIC PANEL
ALT: 31 U/L (ref 0–44)
AST: 86 U/L — ABNORMAL HIGH (ref 15–41)
Albumin: 2.8 g/dL — ABNORMAL LOW (ref 3.5–5.0)
Alkaline Phosphatase: 108 U/L (ref 38–126)
Anion gap: 8 (ref 5–15)
BUN: 6 mg/dL (ref 6–20)
CO2: 24 mmol/L (ref 22–32)
Calcium: 8.2 mg/dL — ABNORMAL LOW (ref 8.9–10.3)
Chloride: 105 mmol/L (ref 98–111)
Creatinine, Ser: 0.44 mg/dL — ABNORMAL LOW (ref 0.61–1.24)
GFR calc Af Amer: >60 mL/min (ref >60)
GFR calc non Af Amer: >60 mL/min (ref >60)
Glucose, Bld: 79 mg/dL (ref 70–99)
Potassium: 3.3 mmol/L — ABNORMAL LOW (ref 3.5–5.1)
Sodium: 137 mmol/L (ref 135–145)
Total Bilirubin: 1.1 mg/dL (ref 0.3–1.2)
Total Protein: 5.9 g/dL — ABNORMAL LOW (ref 6.5–8.1)

## 2019-06-03 LAB — C-REACTIVE PROTEIN
CRP: <0.5 mg/dL (ref <1.0)
CRP: 0.8 mg/dL (ref <1.0)

## 2019-06-03 LAB — FERRITIN: Ferritin: 41 ng/mL (ref 24–336)

## 2019-06-03 LAB — FIBRIN DERIVATIVES D-DIMER (ARMC ONLY): Fibrin derivatives D-dimer (ARMC): 2655.94 ng/mL (FEU) — ABNORMAL HIGH (ref 0.00–499.00)

## 2019-06-03 MED ORDER — TRAZODONE HCL 50 MG PO TABS
50.0000 mg | ORAL_TABLET | Freq: Every evening | ORAL | Status: DC | PRN
  Administered 2019-06-03: 22:00:00 50 mg via ORAL
  Filled 2019-06-03: qty 1

## 2019-06-03 MED ORDER — POTASSIUM CHLORIDE CRYS ER 20 MEQ PO TBCR
40.0000 meq | EXTENDED_RELEASE_TABLET | Freq: Once | ORAL | Status: AC
  Administered 2019-06-03: 40 meq via ORAL
  Filled 2019-06-03: qty 2

## 2019-06-03 NOTE — Progress Notes (Signed)
1        [place] at [facility]   PATIENT NAME: Brett Washington    MR#:  [id number]  DATE OF BIRTH:  17-Mar-1981  SUBJECTIVE:  CHIEF COMPLAINT:   Chief Complaint  Patient presents with  . Alcohol Intoxication  Tremulous, shaky.  Wants to eat, no other complaints REVIEW OF SYSTEMS:  Review of Systems  Constitutional: Negative for diaphoresis, fever, malaise/fatigue and weight loss.  HENT: Negative for ear discharge, ear pain, hearing loss, nosebleeds, sore throat and tinnitus.   Eyes: Negative for blurred vision and pain.  Respiratory: Negative for cough, hemoptysis, shortness of breath and wheezing.   Cardiovascular: Negative for chest pain, palpitations, orthopnea and leg swelling.  Gastrointestinal: Negative for abdominal pain, blood in stool, constipation, diarrhea, heartburn, nausea and vomiting.  Genitourinary: Negative for dysuria, frequency and urgency.  Musculoskeletal: Negative for back pain and myalgias.  Skin: Negative for itching and rash.  Neurological: Positive for tremors. Negative for dizziness, tingling, focal weakness, seizures, weakness and headaches.  Psychiatric/Behavioral: Negative for depression. The patient is not nervous/anxious.     DRUG ALLERGIES:  No Known Allergies VITALS:  Blood pressure 116/83, pulse 88, temperature 99 F (37.2 C), resp. rate 16, height 5\' 6"  (1.676 m), weight 79.8 kg, SpO2 98 %. PHYSICAL EXAMINATION:  Physical Exam HENT:     Head: Normocephalic and atraumatic.  Eyes:     Conjunctiva/sclera: Conjunctivae normal.     Pupils: Pupils are equal, round, and reactive to light.  Neck:     Thyroid: No thyromegaly.     Trachea: No tracheal deviation.  Cardiovascular:     Rate and Rhythm: Normal rate and regular rhythm.     Heart sounds: Normal heart sounds.  Pulmonary:     Effort: Pulmonary effort is normal. No respiratory distress.     Breath sounds: Normal breath sounds. No wheezing.  Chest:     Chest wall: No  tenderness.  Abdominal:     General: Bowel sounds are normal. There is no distension.     Palpations: Abdomen is soft.     Tenderness: There is no abdominal tenderness.  Musculoskeletal:        General: Normal range of motion.     Cervical back: Normal range of motion and neck supple.  Skin:    General: Skin is warm and dry.     Findings: No rash.  Neurological:     Mental Status: He is alert and oriented to person, place, and time.     Cranial Nerves: No cranial nerve deficit.     Motor: Tremor present.    LABORATORY PANEL:  Male CBC Recent Labs  Lab 06/02/19 1710  WBC 10.7*  HGB 11.6*  HCT 34.8*  PLT 181   ------------------------------------------------------------------------------------------------------------------ Chemistries  Recent Labs  Lab 06/02/19 0922 06/02/19 1710 06/03/19 0334  NA   < >  --  137  K   < >  --  3.3*  CL   < >  --  105  CO2   < >  --  24  GLUCOSE   < >  --  79  BUN   < >  --  6  CREATININE   < >  --  0.44*  CALCIUM   < >  --  8.2*  MG  --  1.8  --   AST   < >  --  86*  ALT   < >  --  31  ALKPHOS   < >  --  108  BILITOT   < >  --  1.1   < > = values in this interval not displayed.   RADIOLOGY:  No results found. ASSESSMENT AND PLAN:  Brett Washington  is a 39 y.o. male, with chronic alcohol abuse, recurrent ED visits was seen here yesterday with alcohol intoxication  Severe sepsis with hypothermia -present on admission, noninfectious etiology Likely due to cold environment. Temperature normalized.  Sepsis resolved  Alcohol intoxication with delirium ([place]) -Was still tremulous and jittery this morning when I saw him Ongoing severe alcohol use with transaminitis and chronic thrombocytopenia.  High risk for withdrawal seizures and DTs.  Monitor on CIWA.  IV hydration.  Seizure precautions.  Continue thiamine, folate and multivitamin. Social work consult.  COVID-19 infection Resulted positive on 06/02/2019. No respiratory symptoms.  Maintain airborne and contact precautions. Maintaining sats on room air.   Acute metabolic encephalopathy with acute metabolic acidosis Secondary to alcohol intoxication and alcoholic ketoacidosis.  Mental status back to baseline  Acute rhabdomyolysis CK in 700s.  Cannot rule out alcohol withdrawal seizures.  Monitor with IV hydration. Recheck CK in the morning  Prolonged QTC Monitor K and mag and replenished.  Monitor on telemetry.  Transaminitis Secondary to alcohol use.  Monitor for now.  Thrombocytopenia Chronic with alcohol use  Patient can be transferred to any MedSurg floor   DVT prophylaxis: Lovenox subcu Family Communication: NO "discussed with patient" Disposition Plan:  Came from home Discharge back home once clinically improved Barriers to DC -waiting for clinical improvement  All the records are reviewed and case discussed with Care Management/Social Worker. Management plans discussed with the patient, nursing and they are in agreement.  CODE STATUS: Full Code  TOTAL TIME TAKING CARE OF THIS PATIENT: 35 minutes.   More than 50% of the time was spent in counseling/coordination of care: YES  POSSIBLE D/C IN 1-2 DAYS, DEPENDING ON CLINICAL CONDITION.   Max Sane M.D on 06/03/2019 at 4:22 PM  [facility]   CC: Primary care physician; [facility] [place]  Note: This dictation was prepared with Diplomatic Services operational officer dictation along with smaller phrase technology. Any transcriptional errors that result from this process are unintentional.

## 2019-06-03 NOTE — Progress Notes (Addendum)
CH performed phone visit w/pt. per COVID precautions.  Pt. reported he came to [facility] after passing out and being brought her after he was found; he does not know why he passed out, he shared.  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  Pt. requested CH to find his mother's phone number so he could call her from his rm.  CH offered prayer for pt. before end of phone call.  Mother's # not in chart; CH contacted pt.'s spouse Byrd Hesselbach) and sister Ricki Rodriguez) and obtained mother's number; family attempted to visit yesterday but were denied access to pt. per COVID policy.  CH gave mother's contact info to RN so family could be updated; RN will pass number along to son.   No further needs expressed at this time.       06/03/19 1145  Clinical Encounter Type  Visited With Patient;Family;Health care provider  Visit Type Initial;Psychological support;Spiritual support;Social support;Critical Care  Consult/Referral To Nurse  Spiritual Encounters  Spiritual Needs Prayer  Stress Factors  Patient Stress Factors Family relationships;Major life changes  Family Stress Factors Lack of knowledge;Health changes;Loss of control

## 2019-06-04 LAB — CBC
HCT: 33.2 % — ABNORMAL LOW (ref 39.0–52.0)
Hemoglobin: 11.4 g/dL — ABNORMAL LOW (ref 13.0–17.0)
MCH: 32.4 pg (ref 26.0–34.0)
MCHC: 34.3 g/dL (ref 30.0–36.0)
MCV: 94.3 fL (ref 80.0–100.0)
Platelets: 128 10*3/uL — ABNORMAL LOW (ref 150–400)
RBC: 3.52 MIL/uL — ABNORMAL LOW (ref 4.22–5.81)
RDW: 12.2 % (ref 11.5–15.5)
WBC: 4.1 10*3/uL (ref 4.0–10.5)
nRBC: 0 % (ref 0.0–0.2)

## 2019-06-04 LAB — BASIC METABOLIC PANEL
Anion gap: 7 (ref 5–15)
BUN: <5 mg/dL — ABNORMAL LOW (ref 6–20)
CO2: 25 mmol/L (ref 22–32)
Calcium: 8.7 mg/dL — ABNORMAL LOW (ref 8.9–10.3)
Chloride: 105 mmol/L (ref 98–111)
Creatinine, Ser: 0.33 mg/dL — ABNORMAL LOW (ref 0.61–1.24)
GFR calc Af Amer: >60 mL/min (ref >60)
GFR calc non Af Amer: >60 mL/min (ref >60)
Glucose, Bld: 85 mg/dL (ref 70–99)
Potassium: 3.2 mmol/L — ABNORMAL LOW (ref 3.5–5.1)
Sodium: 137 mmol/L (ref 135–145)

## 2019-06-04 LAB — FIBRIN DERIVATIVES D-DIMER (ARMC ONLY): Fibrin derivatives D-dimer (ARMC): 1649.06 ng/mL (FEU) — ABNORMAL HIGH (ref 0.00–499.00)

## 2019-06-04 LAB — FERRITIN: Ferritin: 55 ng/mL (ref 24–336)

## 2019-06-04 LAB — GLUCOSE, CAPILLARY: Glucose-Capillary: 85 mg/dL (ref 70–99)

## 2019-06-04 LAB — C-REACTIVE PROTEIN: CRP: 0.8 mg/dL (ref <1.0)

## 2019-06-04 LAB — CK: Total CK: 641 U/L — ABNORMAL HIGH (ref 49–397)

## 2019-06-04 MED ORDER — THIAMINE HCL 100 MG PO TABS: 100.0000 mg | ORAL_TABLET | Freq: Every day | ORAL | 0 refills | Status: DC

## 2019-06-04 MED ORDER — FOLIC ACID 1 MG PO TABS: 1.0000 mg | ORAL_TABLET | Freq: Every day | ORAL | 0 refills | Status: DC

## 2019-06-04 MED ORDER — POTASSIUM CHLORIDE CRYS ER 20 MEQ PO TBCR
40.0000 meq | EXTENDED_RELEASE_TABLET | Freq: Once | ORAL | Status: AC
  Administered 2019-06-04: 40 meq via ORAL
  Filled 2019-06-04: qty 2

## 2019-06-04 NOTE — Plan of Care (Signed)
  Problem: Education: Goal: Knowledge of risk factors and measures for prevention of condition will improve Outcome: Progressing   Problem: Coping: Goal: Psychosocial and spiritual needs will be supported Outcome: Progressing   Problem: Respiratory: Goal: Will maintain a patent airway Outcome: Progressing Goal: Complications related to the disease process, condition or treatment will be avoided or minimized Outcome: Progressing   

## 2019-06-04 NOTE — Progress Notes (Signed)
Foley removed and pt. Educated on importance of voiding before discharge. States understanding.

## 2019-06-04 NOTE — Progress Notes (Signed)
Pt. Has voided and discharge education completed. No additional needs noted.

## 2019-06-04 NOTE — TOC Initial Note (Signed)
Transition of Care [facility]) - Initial/Assessment Note    Patient Details  Name: Brett Washington MRN: [id number] Date of Birth: 1980-08-05  Transition of Care [facility] CM/SW Contact:    Trenton Founds, RN Phone Number: 06/04/2019, 10:38 AM  Clinical Narrative:  RNCM assessed patient by phone due to +Covid diagnosis. Consult placed due to need for substance abuse resources. Called into room and spoke with patient and explained role. Patient was agreeable to answer some questions. Patient reports he lives at home with his wife and children. He reports that he knows he needs help with his drinking and is agreeable to [facility] bringing him up some resources.  RNCM delivered resource list and book to nurse to give to patient.                  Expected Discharge Plan: Home/Self Care Barriers to Discharge: No Barriers Identified   Patient Goals and CMS Choice        Expected Discharge Plan and Services Expected Discharge Plan: Home/Self Care       Living arrangements for the past 2 months: Single Family Home Expected Discharge Date: 06/04/19                                    Prior Living Arrangements/Services Living arrangements for the past 2 months: Single Family Home Lives with:: Spouse, Minor Children   Do you feel safe going back to the place where you live?: Yes            Criminal Activity/Legal Involvement Pertinent to Current Situation/Hospitalization: No - Comment as needed  Activities of Daily Living Home Assistive Devices/Equipment: None ADL Screening (condition at time of admission) Patient's cognitive ability adequate to safely complete daily activities?: Yes Is the patient deaf or have difficulty hearing?: No Does the patient have difficulty seeing, even when wearing glasses/contacts?: No Does the patient have difficulty concentrating, remembering, or making decisions?: No Patient able to express need for assistance with ADLs?: Yes Does the patient have  difficulty dressing or bathing?: No Independently performs ADLs?: Yes (appropriate for developmental age) Does the patient have difficulty walking or climbing stairs?: No Weakness of Legs: None Weakness of Arms/Hands: None  Permission Sought/Granted                  Emotional Assessment   Attitude/Demeanor/Rapport: Engaged   Orientation: : Oriented to Self, Oriented to Place, Oriented to  Time, Oriented to Situation      Admission diagnosis:  Hypothermia [T68.XXXA] Hypothermia, initial encounter [T68.XXXA] Alcohol intoxication with delirium (HCC) [F10.921] Hypothermia due to cold environment [T68.XXXA] Patient Active Problem List   Diagnosis Date Noted  . Hypothermia 06/02/2019  . Hypothermia due to cold environment 06/02/2019  . Alcohol intoxication with delirium (HCC) 06/02/2019  . COVID-19 virus infection 06/02/2019  . LOC (loss of consciousness) (HCC) 01/11/2017  . Alcohol use disorder, severe, dependence (HCC) 11/06/2016  . Aspiration pneumonia due to vomit (HCC)   . Alcohol withdrawal seizure (HCC) 10/31/2016  . Thrombocytopenia (HCC) 10/31/2016  . Transaminitis 10/31/2016  . Hypokalemia 10/31/2016   PCP:  [facility]:   [facility] [address] ([address]), [place] - [address] [place]) [place] [address] Phone: [phone] Fax: [phone]     Social Determinants of Health (SDOH) Interventions    Readmission Risk Interventions No flowsheet data found.

## 2019-06-05 NOTE — Discharge Summary (Signed)
[facility] NAME: Brett Washington    MR#:  [id number]  DATE OF BIRTH:  [birth year]  DATE OF ADMISSION:  06/02/2019   ADMITTING PHYSICIAN: Max Sane, MD  DATE OF DISCHARGE: 06/04/2019 10:29 AM  PRIMARY CARE PHYSICIAN: [facility] [place]   ADMISSION DIAGNOSIS:  Hypothermia [T68.XXXA] Hypothermia, initial encounter [T68.XXXA] Alcohol intoxication with delirium ([place]) [F10.921] Hypothermia due to cold environment [T68.XXXA] DISCHARGE DIAGNOSIS:  Active Problems:   Thrombocytopenia (HCC)   Transaminitis   Alcohol use disorder, severe, dependence ([place])   Hypothermia   Hypothermia due to cold environment   Alcohol intoxication with delirium ([place])   COVID-19 virus infection  SECONDARY DIAGNOSIS:   Past Medical History:  Diagnosis Date  . Alcohol abuse   . Heart murmur   . Seizures [facility])    HOSPITAL COURSE:  JoseBautistais a38 y.o.male,with chronic alcohol abuse, recurrent ED visits admitted with alcohol intoxication  Hypothermia -present on admission, noninfectious etiology. Sepsis ruled out Likely due to cold environment. Temperature normalized.   Alcohol intoxication with delirium ([place]) -Managed with CIWA protocol  COVID-19 infection Resulted positive on 06/02/2019.No respiratory symptoms. Maintaining sats on room air.   Acute metabolic encephalopathy with acute metabolic acidosis Secondary to alcohol intoxication and alcoholic ketoacidosis. Mental status back to baseline  Acute rhabdomyolysis Resolved with hydration  Prolonged QTC Stable  Transaminitis Secondary to alcohol use.  Thrombocytopenia Chronic and stable -due to alcohol use DISCHARGE CONDITIONS:  Stable CONSULTS OBTAINED:   DRUG ALLERGIES:  No Known Allergies DISCHARGE MEDICATIONS:   Allergies as of 06/04/2019   No Known Allergies     Medication List    TAKE these medications   folic acid 1 MG tablet Commonly  known as: FOLVITE Take 1 tablet (1 mg total) by mouth daily.   thiamine 100 MG tablet Take 1 tablet (100 mg total) by mouth daily.      DISCHARGE INSTRUCTIONS:   DIET:  Cardiac diet DISCHARGE CONDITION:  Stable ACTIVITY:  Activity as tolerated OXYGEN:  Home Oxygen: No.  Oxygen Delivery: room air DISCHARGE LOCATION:  home   If you experience worsening of your admission symptoms, develop shortness of breath, life threatening emergency, suicidal or homicidal thoughts you must seek medical attention immediately by calling 911 or calling your MD immediately  if symptoms less severe.  You Must read complete instructions/literature along with all the possible adverse reactions/side effects for all the Medicines you take and that have been prescribed to you. Take any new Medicines after you have completely understood and accpet all the possible adverse reactions/side effects.   Please note  You were cared for by a hospitalist during your hospital stay. If you have any questions about your discharge medications or the care you received while you were in the hospital after you are discharged, you can call the unit and asked to speak with the hospitalist on call if the hospitalist that took care of you is not available. Once you are discharged, your primary care physician will handle any further medical issues. Please note that NO REFILLS for any discharge medications will be authorized once you are discharged, as it is imperative that you return to your primary care physician (or establish a relationship with a primary care physician if you do not have one) for your aftercare needs so that they can reassess your need for medications and monitor your lab values.    On the day of Discharge:  VITAL SIGNS:  Blood pressure 115/66, pulse 84, temperature 98.2 F (36.8 C), temperature source Oral, resp. rate 17, height 5\' 6"  (1.676 m), weight 79.8 kg, SpO2 100 %. PHYSICAL EXAMINATION:  GENERAL:   39 y.o.-year-old patient lying in the bed with no acute distress.  EYES: Pupils equal, round, reactive to light and accommodation. No scleral icterus. Extraocular muscles intact.  HEENT: Head atraumatic, normocephalic. Oropharynx and nasopharynx clear.  NECK:  Supple, no jugular venous distention. No thyroid enlargement, no tenderness.  LUNGS: Normal breath sounds bilaterally, no wheezing, rales,rhonchi or crepitation. No use of accessory muscles of respiration.  CARDIOVASCULAR: S1, S2 normal. No murmurs, rubs, or gallops.  ABDOMEN: Soft, non-tender, non-distended. Bowel sounds present. No organomegaly or mass.  EXTREMITIES: No pedal edema, cyanosis, or clubbing.  NEUROLOGIC: Cranial nerves II through XII are intact. Muscle strength 5/5 in all extremities. Sensation intact. Gait not checked.  PSYCHIATRIC: The patient is alert and oriented x 3.  SKIN: No obvious rash, lesion, or ulcer.  DATA REVIEW:   CBC Recent Labs  Lab 06/04/19 0323  WBC 4.1  HGB 11.4*  HCT 33.2*  PLT 128*    Chemistries  Recent Labs  Lab 06/02/19 0922 06/02/19 1710 06/03/19 0334 06/03/19 0334 06/04/19 0323  NA   < >  --  137   < > 137  K   < >  --  3.3*   < > 3.2*  CL   < >  --  105   < > 105  CO2   < >  --  24   < > 25  GLUCOSE   < >  --  79   < > 85  BUN   < >  --  6   < > <5*  CREATININE   < >  --  0.44*   < > 0.33*  CALCIUM   < >  --  8.2*   < > 8.7*  MG  --  1.8  --   --   --   AST   < >  --  86*  --   --   ALT   < >  --  31  --   --   ALKPHOS   < >  --  108  --   --   BILITOT   < >  --  1.1  --   --    < > = values in this interval not displayed.     Outpatient follow-up Follow-up Information    06/06/19, NP. Call on 06/08/2019.   Specialty: Nurse Practitioner Why: @10 :00 AM via Tele video   Contact information: [address] [place] [phone]            Management plans discussed with the patient, family and they are in agreement.  CODE  STATUS: Prior   TOTAL TIME TAKING CARE OF THIS PATIENT: 45 minutes.    [id number] M.D on 06/05/2019 at 3:22 PM  [facility]   CC: Primary care physician; [facility]   Note: This dictation was prepared with 06/07/2019 dictation along with smaller phrase technology. Any transcriptional errors that result from this process are unintentional.

## 2019-06-14 ENCOUNTER — Other Ambulatory Visit: Payer: Self-pay | Admitting: Internal Medicine

## 2019-06-14 DIAGNOSIS — F1020 Alcohol dependence, uncomplicated: Secondary | ICD-10-CM

## 2019-06-30 ENCOUNTER — Other Ambulatory Visit: Payer: Self-pay | Admitting: Internal Medicine

## 2019-11-01 ENCOUNTER — Emergency Department: Payer: Self-pay

## 2019-11-01 ENCOUNTER — Inpatient Hospital Stay
Admission: EM | Admit: 2019-11-01 | Discharge: 2019-11-09 | DRG: 432 | Disposition: A | Discharge status: Home / Self Care | Payer: Self-pay | Attending: Internal Medicine | Admitting: Internal Medicine

## 2019-11-01 ENCOUNTER — Other Ambulatory Visit: Payer: Self-pay

## 2019-11-01 DIAGNOSIS — F10231 Alcohol dependence with withdrawal delirium: Secondary | ICD-10-CM | POA: Diagnosis present

## 2019-11-01 DIAGNOSIS — K7682 Hepatic encephalopathy: Secondary | ICD-10-CM

## 2019-11-01 DIAGNOSIS — Z8616 Personal history of COVID-19: Secondary | ICD-10-CM

## 2019-11-01 DIAGNOSIS — K7201 Acute and subacute hepatic failure with coma: Secondary | ICD-10-CM

## 2019-11-01 DIAGNOSIS — Z20822 Contact with and (suspected) exposure to covid-19: Secondary | ICD-10-CM | POA: Diagnosis present

## 2019-11-01 DIAGNOSIS — E876 Hypokalemia: Secondary | ICD-10-CM | POA: Diagnosis present

## 2019-11-01 DIAGNOSIS — R945 Abnormal results of liver function studies: Secondary | ICD-10-CM

## 2019-11-01 DIAGNOSIS — D539 Nutritional anemia, unspecified: Secondary | ICD-10-CM | POA: Diagnosis present

## 2019-11-01 DIAGNOSIS — R17 Unspecified jaundice: Secondary | ICD-10-CM

## 2019-11-01 DIAGNOSIS — Z789 Other specified health status: Secondary | ICD-10-CM

## 2019-11-01 DIAGNOSIS — K7040 Alcoholic hepatic failure without coma: Principal | ICD-10-CM | POA: Diagnosis present

## 2019-11-01 DIAGNOSIS — K7030 Alcoholic cirrhosis of liver without ascites: Secondary | ICD-10-CM | POA: Diagnosis present

## 2019-11-01 DIAGNOSIS — K920 Hematemesis: Secondary | ICD-10-CM | POA: Diagnosis present

## 2019-11-01 DIAGNOSIS — R042 Hemoptysis: Secondary | ICD-10-CM | POA: Diagnosis present

## 2019-11-01 DIAGNOSIS — F1010 Alcohol abuse, uncomplicated: Secondary | ICD-10-CM | POA: Diagnosis present

## 2019-11-01 DIAGNOSIS — Z7189 Other specified counseling: Secondary | ICD-10-CM

## 2019-11-01 DIAGNOSIS — E538 Deficiency of other specified B group vitamins: Secondary | ICD-10-CM | POA: Diagnosis present

## 2019-11-01 DIAGNOSIS — K7010 Alcoholic hepatitis without ascites: Secondary | ICD-10-CM | POA: Diagnosis present

## 2019-11-01 DIAGNOSIS — I959 Hypotension, unspecified: Secondary | ICD-10-CM | POA: Diagnosis present

## 2019-11-01 DIAGNOSIS — D696 Thrombocytopenia, unspecified: Secondary | ICD-10-CM

## 2019-11-01 DIAGNOSIS — R569 Unspecified convulsions: Secondary | ICD-10-CM | POA: Diagnosis present

## 2019-11-01 DIAGNOSIS — K3189 Other diseases of stomach and duodenum: Secondary | ICD-10-CM | POA: Diagnosis present

## 2019-11-01 DIAGNOSIS — K2211 Ulcer of esophagus with bleeding: Secondary | ICD-10-CM | POA: Diagnosis present

## 2019-11-01 DIAGNOSIS — K2920 Alcoholic gastritis without bleeding: Secondary | ICD-10-CM | POA: Diagnosis present

## 2019-11-01 DIAGNOSIS — I8510 Secondary esophageal varices without bleeding: Secondary | ICD-10-CM | POA: Diagnosis present

## 2019-11-01 DIAGNOSIS — R32 Unspecified urinary incontinence: Secondary | ICD-10-CM | POA: Diagnosis not present

## 2019-11-01 DIAGNOSIS — R7989 Other specified abnormal findings of blood chemistry: Secondary | ICD-10-CM | POA: Diagnosis present

## 2019-11-01 DIAGNOSIS — K449 Diaphragmatic hernia without obstruction or gangrene: Secondary | ICD-10-CM | POA: Diagnosis present

## 2019-11-01 DIAGNOSIS — K760 Fatty (change of) liver, not elsewhere classified: Secondary | ICD-10-CM | POA: Diagnosis present

## 2019-11-01 DIAGNOSIS — Z515 Encounter for palliative care: Secondary | ICD-10-CM | POA: Diagnosis not present

## 2019-11-01 DIAGNOSIS — E872 Acidosis: Secondary | ICD-10-CM | POA: Diagnosis present

## 2019-11-01 DIAGNOSIS — K766 Portal hypertension: Secondary | ICD-10-CM | POA: Diagnosis present

## 2019-11-01 DIAGNOSIS — Z560 Unemployment, unspecified: Secondary | ICD-10-CM

## 2019-11-01 DIAGNOSIS — D72829 Elevated white blood cell count, unspecified: Secondary | ICD-10-CM | POA: Diagnosis present

## 2019-11-01 LAB — TYPE AND SCREEN
ABO/RH(D): O POS
Antibody Screen: NEGATIVE

## 2019-11-01 LAB — PROTIME-INR
INR: 1.6 — ABNORMAL HIGH (ref 0.8–1.2)
Prothrombin Time: 18.4 seconds — ABNORMAL HIGH (ref 11.4–15.2)

## 2019-11-01 LAB — PHOSPHORUS: Phosphorus: UNDETERMINED mg/dL (ref 2.5–4.6)

## 2019-11-01 LAB — CBC
HCT: 36.2 % — ABNORMAL LOW (ref 39.0–52.0)
HCT: 35.7 % — ABNORMAL LOW (ref 39.0–52.0)
HCT: 36.8 % — ABNORMAL LOW (ref 39.0–52.0)
Hemoglobin: 13 g/dL (ref 13.0–17.0)
Hemoglobin: 12.4 g/dL — ABNORMAL LOW (ref 13.0–17.0)
Hemoglobin: 12.6 g/dL — ABNORMAL LOW (ref 13.0–17.0)
MCH: 34.9 pg — ABNORMAL HIGH (ref 26.0–34.0)
MCH: 35.2 pg — ABNORMAL HIGH (ref 26.0–34.0)
MCH: 35.1 pg — ABNORMAL HIGH (ref 26.0–34.0)
MCHC: 35.9 g/dL (ref 30.0–36.0)
MCHC: 34.7 g/dL (ref 30.0–36.0)
MCHC: 34.2 g/dL (ref 30.0–36.0)
MCV: 97.3 fL (ref 80.0–100.0)
MCV: 101.4 fL — ABNORMAL HIGH (ref 80.0–100.0)
MCV: 102.5 fL — ABNORMAL HIGH (ref 80.0–100.0)
Platelets: 114 10*3/uL — ABNORMAL LOW (ref 150–400)
Platelets: 102 10*3/uL — ABNORMAL LOW (ref 150–400)
Platelets: 95 10*3/uL — ABNORMAL LOW (ref 150–400)
RBC: 3.72 MIL/uL — ABNORMAL LOW (ref 4.22–5.81)
RBC: 3.52 MIL/uL — ABNORMAL LOW (ref 4.22–5.81)
RBC: 3.59 MIL/uL — ABNORMAL LOW (ref 4.22–5.81)
RDW: 16.5 % — ABNORMAL HIGH (ref 11.5–15.5)
RDW: 16.8 % — ABNORMAL HIGH (ref 11.5–15.5)
RDW: 16.8 % — ABNORMAL HIGH (ref 11.5–15.5)
WBC: 14.4 10*3/uL — ABNORMAL HIGH (ref 4.0–10.5)
WBC: 13.5 10*3/uL — ABNORMAL HIGH (ref 4.0–10.5)
WBC: 10.6 10*3/uL — ABNORMAL HIGH (ref 4.0–10.5)
nRBC: 0 % (ref 0.0–0.2)
nRBC: 0 % (ref 0.0–0.2)
nRBC: 0 % (ref 0.0–0.2)

## 2019-11-01 LAB — URINALYSIS, COMPLETE (UACMP) WITH MICROSCOPIC
Bacteria, UA: NONE SEEN
Glucose, UA: NEGATIVE mg/dL
Hgb urine dipstick: NEGATIVE
Ketones, ur: 5 mg/dL — AB
Leukocytes,Ua: NEGATIVE
Nitrite: NEGATIVE
Protein, ur: NEGATIVE mg/dL
Specific Gravity, Urine: 1.045 — ABNORMAL HIGH (ref 1.005–1.030)
Squamous Epithelial / HPF: NONE SEEN (ref 0–5)
pH: 6 (ref 5.0–8.0)

## 2019-11-01 LAB — FOLATE: Folate: 5.9 ng/mL — ABNORMAL LOW (ref >5.9)

## 2019-11-01 LAB — COMPREHENSIVE METABOLIC PANEL
ALT: 32 U/L (ref 0–44)
AST: 164 U/L — ABNORMAL HIGH (ref 15–41)
Albumin: 2.2 g/dL — ABNORMAL LOW (ref 3.5–5.0)
Alkaline Phosphatase: 256 U/L — ABNORMAL HIGH (ref 38–126)
Anion gap: 11 (ref 5–15)
BUN: <5 mg/dL — ABNORMAL LOW (ref 6–20)
CO2: 25 mmol/L (ref 22–32)
Calcium: 7.9 mg/dL — ABNORMAL LOW (ref 8.9–10.3)
Chloride: 100 mmol/L (ref 98–111)
Creatinine, Ser: 0.37 mg/dL — ABNORMAL LOW (ref 0.61–1.24)
GFR calc Af Amer: >60 mL/min (ref >60)
GFR calc non Af Amer: >60 mL/min (ref >60)
Glucose, Bld: 122 mg/dL — ABNORMAL HIGH (ref 70–99)
Potassium: 2.5 mmol/L — CL (ref 3.5–5.1)
Sodium: 136 mmol/L (ref 135–145)
Total Bilirubin: 21.1 mg/dL (ref 0.3–1.2)
Total Protein: 7.6 g/dL (ref 6.5–8.1)

## 2019-11-01 LAB — SARS CORONAVIRUS 2 BY RT PCR (HOSPITAL ORDER, PERFORMED IN ~~LOC~~ HOSPITAL LAB): SARS Coronavirus 2: NEGATIVE

## 2019-11-01 LAB — APTT: aPTT: 56 seconds — ABNORMAL HIGH (ref 24–36)

## 2019-11-01 LAB — LACTIC ACID, PLASMA
Lactic Acid, Venous: 2.9 mmol/L (ref 0.5–1.9)
Lactic Acid, Venous: 2.2 mmol/L (ref 0.5–1.9)
Lactic Acid, Venous: 2.8 mmol/L (ref 0.5–1.9)

## 2019-11-01 LAB — AMMONIA: Ammonia: 117 umol/L — ABNORMAL HIGH (ref 9–35)

## 2019-11-01 LAB — PROCALCITONIN: Procalcitonin: 0.25 ng/mL

## 2019-11-01 LAB — MAGNESIUM: Magnesium: 2 mg/dL (ref 1.7–2.4)

## 2019-11-01 MED ORDER — PANTOPRAZOLE SODIUM 40 MG IV SOLR: 40.0000 mg | Freq: Two times a day (BID) | INTRAVENOUS | Status: DC

## 2019-11-01 MED ORDER — SODIUM CHLORIDE 0.9 % IV SOLN
50.0000 ug/h | INTRAVENOUS | Status: DC
  Administered 2019-11-01 – 2019-11-02 (×3): 50 ug/h via INTRAVENOUS
  Filled 2019-11-01 (×6): qty 1

## 2019-11-01 MED ORDER — THIAMINE HCL 100 MG/ML IJ SOLN: 100.0000 mg | Freq: Every day | INTRAMUSCULAR | Status: DC

## 2019-11-01 MED ORDER — PHYTONADIONE 5 MG PO TABS
5.0000 mg | ORAL_TABLET | Freq: Every day | ORAL | Status: AC
  Administered 2019-11-02 – 2019-11-03 (×2): 5 mg via ORAL
  Filled 2019-11-01 (×2): qty 1

## 2019-11-01 MED ORDER — IOHEXOL 300 MG/ML  SOLN
100.0000 mL | Freq: Once | INTRAMUSCULAR | Status: AC | PRN
  Administered 2019-11-01: 100 mL via INTRAVENOUS

## 2019-11-01 MED ORDER — ONDANSETRON HCL 4 MG/2ML IJ SOLN
4.0000 mg | Freq: Once | INTRAMUSCULAR | Status: AC
  Administered 2019-11-01: 4 mg via INTRAVENOUS
  Filled 2019-11-01: qty 2

## 2019-11-01 MED ORDER — SODIUM CHLORIDE 0.9 % IV BOLUS: 1000.0000 mL | Freq: Once | INTRAVENOUS | Status: DC

## 2019-11-01 MED ORDER — LORAZEPAM 2 MG/ML IJ SOLN
0.0000 mg | Freq: Four times a day (QID) | INTRAMUSCULAR | Status: AC
  Administered 2019-11-01: 2 mg via INTRAVENOUS
  Administered 2019-11-02: 1 mg via INTRAVENOUS
  Administered 2019-11-03: 2 mg via INTRAVENOUS
  Filled 2019-11-01 (×3): qty 1

## 2019-11-01 MED ORDER — LACTULOSE 10 GM/15ML PO SOLN
30.0000 g | Freq: Once | ORAL | Status: AC
  Administered 2019-11-01: 30 g via ORAL
  Filled 2019-11-01: qty 60

## 2019-11-01 MED ORDER — SODIUM CHLORIDE 0.9 % IV SOLN
INTRAVENOUS | Status: DC | PRN
  Administered 2019-11-02: 250 mL via INTRAVENOUS

## 2019-11-01 MED ORDER — PHYTONADIONE 1 MG/0.5 ML ORAL SOLUTION
1.0000 mg | Freq: Once | ORAL | Status: AC
  Administered 2019-11-01: 1 mg via ORAL
  Filled 2019-11-01: qty 0.5

## 2019-11-01 MED ORDER — VITAMIN K1 10 MG/ML IJ SOLN: 5.0000 mg | Freq: Every day | INTRAMUSCULAR | Status: DC

## 2019-11-01 MED ORDER — LORAZEPAM 2 MG/ML IJ SOLN
INTRAMUSCULAR | Status: AC
  Administered 2019-11-01: 1 mg via INTRAVENOUS
  Filled 2019-11-01: qty 1

## 2019-11-01 MED ORDER — SODIUM CHLORIDE 0.9% FLUSH
3.0000 mL | INTRAVENOUS | Status: DC | PRN
  Administered 2019-11-02 (×2): 3 mL via INTRAVENOUS

## 2019-11-01 MED ORDER — LORAZEPAM 2 MG PO TABS
0.0000 mg | ORAL_TABLET | Freq: Two times a day (BID) | ORAL | Status: DC
  Administered 2019-11-03: 4 mg via ORAL
  Filled 2019-11-01: qty 2

## 2019-11-01 MED ORDER — ONDANSETRON HCL 4 MG/2ML IJ SOLN
4.0000 mg | Freq: Four times a day (QID) | INTRAMUSCULAR | Status: DC | PRN
  Administered 2019-11-01 – 2019-11-09 (×3): 4 mg via INTRAVENOUS
  Filled 2019-11-01 (×4): qty 2

## 2019-11-01 MED ORDER — THIAMINE HCL 100 MG PO TABS
100.0000 mg | ORAL_TABLET | Freq: Every day | ORAL | Status: DC
  Administered 2019-11-02: 100 mg via ORAL
  Filled 2019-11-01: qty 1

## 2019-11-01 MED ORDER — LEVETIRACETAM IN NACL 1000 MG/100ML IV SOLN
1000.0000 mg | Freq: Two times a day (BID) | INTRAVENOUS | Status: DC
  Administered 2019-11-01 – 2019-11-08 (×14): 1000 mg via INTRAVENOUS
  Filled 2019-11-01 (×16): qty 100

## 2019-11-01 MED ORDER — LORAZEPAM 2 MG/ML IJ SOLN
1.0000 mg | INTRAMUSCULAR | Status: DC | PRN
  Administered 2019-11-04: 1 mg via INTRAVENOUS
  Filled 2019-11-01: qty 1

## 2019-11-01 MED ORDER — ONDANSETRON HCL 4 MG PO TABS: 4.0000 mg | ORAL_TABLET | Freq: Four times a day (QID) | ORAL | Status: DC | PRN

## 2019-11-01 MED ORDER — BOOST / RESOURCE BREEZE PO LIQD CUSTOM: 1.0000 | Freq: Three times a day (TID) | ORAL | Status: DC

## 2019-11-01 MED ORDER — SODIUM CHLORIDE 0.9 % IV SOLN
1.0000 g | INTRAVENOUS | Status: DC
  Administered 2019-11-02: 1 g via INTRAVENOUS
  Filled 2019-11-01: qty 10

## 2019-11-01 MED ORDER — LORAZEPAM 2 MG/ML IJ SOLN
0.0000 mg | Freq: Two times a day (BID) | INTRAMUSCULAR | Status: DC
  Administered 2019-11-03: 2 mg via INTRAVENOUS
  Filled 2019-11-01: qty 1

## 2019-11-01 MED ORDER — LACTULOSE 10 GM/15ML PO SOLN
30.0000 g | Freq: Three times a day (TID) | ORAL | Status: DC
  Administered 2019-11-01 – 2019-11-03 (×7): 30 g via ORAL
  Filled 2019-11-01 (×7): qty 60

## 2019-11-01 MED ORDER — SODIUM CHLORIDE 0.9 % IV BOLUS
2000.0000 mL | Freq: Once | INTRAVENOUS | Status: AC
  Administered 2019-11-01: 2000 mL via INTRAVENOUS

## 2019-11-01 MED ORDER — POTASSIUM CHLORIDE 10 MEQ/100ML IV SOLN
10.0000 meq | INTRAVENOUS | Status: AC
  Administered 2019-11-01 (×2): 10 meq via INTRAVENOUS
  Filled 2019-11-01 (×2): qty 100

## 2019-11-01 MED ORDER — POTASSIUM CHLORIDE 20 MEQ/15ML (10%) PO SOLN
40.0000 meq | ORAL | Status: AC
  Administered 2019-11-01 (×2): 40 meq via ORAL
  Filled 2019-11-01 (×3): qty 30

## 2019-11-01 MED ORDER — SODIUM CHLORIDE 0.9 % IV SOLN: INTRAVENOUS | Status: DC

## 2019-11-01 MED ORDER — RIFAXIMIN 550 MG PO TABS
550.0000 mg | ORAL_TABLET | Freq: Two times a day (BID) | ORAL | Status: DC
  Administered 2019-11-01 – 2019-11-09 (×15): 550 mg via ORAL
  Filled 2019-11-01 (×18): qty 1

## 2019-11-01 MED ORDER — SODIUM CHLORIDE 0.9 % IV BOLUS
500.0000 mL | Freq: Once | INTRAVENOUS | Status: AC
  Administered 2019-11-01: 500 mL via INTRAVENOUS

## 2019-11-01 MED ORDER — MAGNESIUM SULFATE IN D5W 1-5 GM/100ML-% IV SOLN
1.0000 g | Freq: Once | INTRAVENOUS | Status: AC
  Administered 2019-11-01: 1 g via INTRAVENOUS
  Filled 2019-11-01 (×2): qty 100

## 2019-11-01 MED ORDER — LORAZEPAM 2 MG PO TABS: 0.0000 mg | ORAL_TABLET | Freq: Four times a day (QID) | ORAL | Status: AC

## 2019-11-01 MED ORDER — SODIUM CHLORIDE 0.9 % IV SOLN
80.0000 mg | Freq: Once | INTRAVENOUS | Status: AC
  Administered 2019-11-01: 80 mg via INTRAVENOUS
  Filled 2019-11-01: qty 80

## 2019-11-01 MED ORDER — RIFAXIMIN 200 MG PO TABS
200.0000 mg | ORAL_TABLET | Freq: Once | ORAL | Status: AC
  Administered 2019-11-01: 200 mg via ORAL
  Filled 2019-11-01: qty 1

## 2019-11-01 MED ORDER — SODIUM CHLORIDE 0.9 % IV SOLN
8.0000 mg/h | INTRAVENOUS | Status: DC
  Administered 2019-11-01 – 2019-11-03 (×5): 8 mg/h via INTRAVENOUS
  Filled 2019-11-01 (×5): qty 80

## 2019-11-01 MED ORDER — LORAZEPAM 2 MG/ML IJ SOLN: 1.0000 mg | Freq: Once | INTRAMUSCULAR | Status: AC

## 2019-11-01 MED ORDER — SODIUM CHLORIDE 0.9 % IV SOLN
1.0000 g | Freq: Once | INTRAVENOUS | Status: AC
  Administered 2019-11-01: 1 g via INTRAVENOUS
  Filled 2019-11-01: qty 10

## 2019-11-01 NOTE — ED Notes (Signed)
Dr. Roxan Hockey informed of critical labs

## 2019-11-01 NOTE — ED Notes (Signed)
Pt from lobby via wheelchair. Pt appears drowsy from meds given in triage and was a 2 person assist from chair to bed. Pt is a poor historian at this time due to drowsiness. Pt in NAD, skin warm and dry. Skin and eyes are yellow.

## 2019-11-01 NOTE — Consult Note (Signed)
Brett Darby, MD [address]  [place]  [place], [place] [address]  Main: [phone]  Fax: [phone] Pager: [phone]   Consultation  Referring Provider:     No ref. provider found Primary Care Physician:  [facility] [place] Primary Gastroenterologist: Althia Forts       Reason for Consultation:     Elevated LFTs, hematemesis  Date of Admission:  11/01/2019 Date of Consultation:  11/01/2019         HPI:   Brett Washington is a 39 y.o. male history of alcohol abuse, frequent admissions for alcohol intoxication.  Patient presented with 2 episodes of seizures at home, confusion for last several days, poor p.o. intake and heavy alcohol use until 3 days ago, hematemesis.  In the ER, he was mildly hypotensive with tachycardia, hemoglobin 13, mild leukocytosis, significantly elevated LFTs ALP 256, AST 164, ALT 32, total bilirubin 21.1, normal renal function, severe hypokalemia, elevated lactic acid, macrocytic anemia, hemoglobin 12.4, mild leukocytosis 13.5, thrombocytopenia platelets 102.  Patient underwent chest x-ray which was normal, limited right upper quadrant ultrasound which revealed increased echogenicity of the liver, patent portal vein, CT head without contrast with no acute intracranial abnormality.  CT abdomen and pelvis with contrast revealed hepatic steatosis with hepatomegaly, no cholelithiasis or biliary dilation.  No evidence of pancreatitis, has moderate splenomegaly, consistent with portal hypertension  Patient is kept n.p.o., started on pantoprazole drip, octreotide drip, lactulose, ceftriaxone for SBP prophylaxis, antiepileptics, CIWA protocol, IV fluids, thiamine plus multivitamin and folate  Patient reports that he has been consuming beer 6-8 beers per day for last 5 years.  He does not get along well with his wife.  He has been living with his parents now.  He used to work at [employer].  He lost his job.  GI is consulted for further  evaluation  NSAIDs: None  Antiplts/Anticoagulants/Anti thrombotics: None  GI Procedures: None  Past Medical History:  Diagnosis Date  . Alcohol abuse   . Heart murmur   . Seizures ([place])     Past Surgical History:  Procedure Laterality Date  . HAND SURGERY      Prior to Admission medications   Medication Sig Start Date End Date Taking? Authorizing Provider  folic acid (FOLVITE) 1 MG tablet Take 1 tablet (1 mg total) by mouth daily. Patient not taking: Reported on 11/01/2019 06/04/19   Max Sane, MD  thiamine 100 MG tablet Take 1 tablet (100 mg total) by mouth daily. Patient not taking: Reported on 11/01/2019 06/04/19   Max Sane, MD   Current Facility-Administered Medications:  .  0.9 %  sodium chloride infusion, , Intravenous, Continuous, Ivor Costa, MD .  Derrill Memo ON 11/02/2019] cefTRIAXone (ROCEPHIN) 1 g in sodium chloride 0.9 % 100 mL IVPB, 1 g, Intravenous, Q24H, Niu, Xilin, MD .  feeding supplement (BOOST / RESOURCE BREEZE) liquid 1 Container, 1 Container, Oral, TID BM, Sharilyn Geisinger, Tally Due, MD .  lactulose ([place]) 10 GM/15ML solution 30 g, 30 g, Oral, TID, Jamaurie Bernier, Tally Due, MD .  levETIRAcetam (KEPPRA) IVPB 1000 mg/100 mL premix, 1,000 mg, Intravenous, Q12H, Ivor Costa, MD, Stopped at 11/01/19 1839 .  LORazepam (ATIVAN) injection 0-4 mg, 0-4 mg, Intravenous, Q6H **OR** LORazepam (ATIVAN) tablet 0-4 mg, 0-4 mg, Oral, Q6H, Merlyn Lot, MD .  Derrill Memo ON 11/03/2019] LORazepam (ATIVAN) injection 0-4 mg, 0-4 mg, Intravenous, Q12H **OR** [START ON 11/03/2019] LORazepam (ATIVAN) tablet 0-4 mg, 0-4 mg, Oral, Q12H, Merlyn Lot, MD .  LORazepam (  ATIVAN) injection 1 mg, 1 mg, Intravenous, Q2H PRN, Lorretta Harp, MD .  octreotide (SANDOSTATIN) 500 mcg in sodium chloride 0.9 % 250 mL (2 mcg/mL) infusion, 50 mcg/hr, Intravenous, Continuous, Willy Eddy, MD, Last Rate: 25 mL/hr at 11/01/19 1332, 50 mcg/hr at 11/01/19 1332 .  ondansetron (ZOFRAN) tablet 4 mg, 4 mg, Oral, Q6H  PRN **OR** ondansetron (ZOFRAN) injection 4 mg, 4 mg, Intravenous, Q6H PRN, Lorretta Harp, MD .  pantoprazole (PROTONIX) 80 mg in sodium chloride 0.9 % 100 mL (0.8 mg/mL) infusion, 8 mg/hr, Intravenous, Continuous, Willy Eddy, MD, Last Rate: 10 mL/hr at 11/01/19 1916, 8 mg/hr at 11/01/19 1916 .  [START ON 11/04/2019] pantoprazole (PROTONIX) injection 40 mg, 40 mg, Intravenous, Q12H, Willy Eddy, MD .  phytonadione (VITAMIN K) tablet 5 mg, 5 mg, Oral, Daily, Haitham Dolinsky, Loel Dubonnet, MD .  potassium chloride 20 MEQ/15ML (10%) solution 40 mEq, 40 mEq, Oral, Q4H, Lorretta Harp, MD, 40 mEq at 11/01/19 1555 .  rifaximin (XIFAXAN) tablet 550 mg, 550 mg, Oral, BID, Syrena Burges, Loel Dubonnet, MD .  thiamine tablet 100 mg, 100 mg, Oral, Daily **OR** thiamine (B-1) injection 100 mg, 100 mg, Intravenous, Daily, Willy Eddy, MD  Current Outpatient Medications:  .  folic acid (FOLVITE) 1 MG tablet, Take 1 tablet (1 mg total) by mouth daily. (Patient not taking: Reported on 11/01/2019), Disp: 30 tablet, Rfl: 0 .  thiamine 100 MG tablet, Take 1 tablet (100 mg total) by mouth daily. (Patient not taking: Reported on 11/01/2019), Disp: 30 tablet, Rfl: 0   Family History  Problem Relation Age of Onset  . Diabetes Mellitus II Mother      Social History   Tobacco Use  . Smoking status: Never Smoker  . Smokeless tobacco: Never Used  Vaping Use  . Vaping Use: Never used  Substance Use Topics  . Alcohol use: Yes    Alcohol/week: 56.0 standard drinks    Types: 56 Cans of beer per week  . Drug use: No    Allergies as of 11/01/2019  . (No Known Allergies)    Review of Systems:    All systems reviewed and negative except where noted in HPI.   Physical Exam:  Vital signs in last 24 hours: Temp:  [99 F (37.2 C)] 99 F (37.2 C) (07/19 1011) Pulse Rate:  [89-110] 92 (07/19 2003) Resp:  [15-27] 17 (07/19 2003) BP: (98-117)/(57-74) 99/64 (07/19 2003) SpO2:  [92 %-99 %] 92 % (07/19 2003) Weight:  [80  kg] 80 kg (07/19 1011)   General:   Not in distress, disheveled, coherent Head:  Normocephalic and atraumatic. Eyes: Deep icterus.   Conjunctiva Mody. PERRLA, severe eye crust. Ears:  Normal auditory acuity. Neck:  Supple; no masses or thyroidomegaly Lungs: Respirations even and unlabored. Lungs clear to auscultation bilaterally.   No wheezes, crackles, or rhonchi.  Heart:  Regular rate and rhythm;  Without murmur, clicks, rubs or gallops Abdomen:  Soft, nondistended, nontender. Normal bowel sounds. No appreciable masses or hepatomegaly.  No rebound or guarding.  Rectal:  Not performed. Msk:  Symmetrical without gross deformities.  Extremities:  Without edema, cyanosis or clubbing. Neurologic:  Alert and oriented x3;  grossly normal neurologically. Skin:  Intact without significant lesions or rashes. Psych:  Alert and cooperative. Normal affect.  LAB RESULTS: CBC Latest Ref Rng & Units 11/01/2019 11/01/2019 06/04/2019  WBC 4.0 - 10.5 K/uL 13.5(H) 14.4(H) 4.1  Hemoglobin 13.0 - 17.0 g/dL 12.4(L) 13.0 11.4(L)  Hematocrit 39 - 52 % 35.7(L) 36.2(L) 33.2(L)  Platelets 150 - 400 K/uL 102(L) 114(L) 128(L)    BMET BMP Latest Ref Rng & Units 11/01/2019 06/04/2019 06/03/2019  Glucose 70 - 99 mg/dL 122(H) 85 79  BUN 6 - 20 mg/dL <5(L) <5(L) 6  Creatinine 0.61 - 1.24 mg/dL 0.37(L) 0.33(L) 0.44(L)  Sodium 135 - 145 mmol/L 136 137 137  Potassium 3.5 - 5.1 mmol/L 2.5(LL) 3.2(L) 3.3(L)  Chloride 98 - 111 mmol/L 100 105 105  CO2 22 - 32 mmol/L '25 25 24  '$ Calcium 8.9 - 10.3 mg/dL 7.9(L) 8.7(L) 8.2(L)    LFT Hepatic Function Latest Ref Rng & Units 11/01/2019 06/03/2019 06/02/2019  Total Protein 6.5 - 8.1 g/dL 7.6 5.9(L) 8.6(H)  Albumin 3.5 - 5.0 g/dL 2.2(L) 2.8(L) 3.9  AST 15 - 41 U/L 164(H) 86(H) 121(H)  ALT 0 - 44 U/L 32 31 39  Alk Phosphatase 38 - 126 U/L 256(H) 108 134(H)  Total Bilirubin 0.3 - 1.2 mg/dL 21.1(HH) 1.1 1.1  Bilirubin, Direct 0.0 - 0.2 mg/dL - - -     STUDIES: CT HEAD WO  CONTRAST  Result Date: 11/01/2019 CLINICAL DATA:  Syncopal episodes EXAM: CT HEAD WITHOUT CONTRAST TECHNIQUE: Contiguous axial images were obtained from the base of the skull through the vertex without intravenous contrast. COMPARISON:  06/02/2019 FINDINGS: Brain: There is no acute intracranial hemorrhage, mass effect, or edema. Gray-white differentiation is preserved. There is no extra-axial fluid collection. There is unchanged slight prominence of the ventricles and sulci compatible with minor parenchymal volume loss greater than expected for age. Vascular: No hyperdense vessel or unexpected calcification. Skull: Calvarium is unremarkable. Sinuses/Orbits: No acute finding. Other: None. IMPRESSION: No acute intracranial abnormality. Electronically Signed   By: Macy Mis M.D.   On: 11/01/2019 14:47   CT ABDOMEN PELVIS W CONTRAST  Result Date: 11/01/2019 CLINICAL DATA:  Nausea, vomiting. EXAM: CT ABDOMEN AND PELVIS WITH CONTRAST TECHNIQUE: Multidetector CT imaging of the abdomen and pelvis was performed using the standard protocol following bolus administration of intravenous contrast. CONTRAST:  128m OMNIPAQUE IOHEXOL 300 MG/ML  SOLN COMPARISON:  None. FINDINGS: Lower chest: No acute abnormality. Hepatobiliary: No gallstones or biliary dilatation is noted. Hepatic steatosis is noted. Hepatomegaly is noted. Pancreas: Unremarkable. No pancreatic ductal dilatation or surrounding inflammatory changes. Spleen: Moderate splenomegaly is noted. Adrenals/Urinary Tract: Adrenal glands are unremarkable. Kidneys are normal, without renal calculi, focal lesion, or hydronephrosis. Bladder is unremarkable. Stomach/Bowel: Stomach is within normal limits. Appendix appears normal. No evidence of bowel wall thickening, distention, or inflammatory changes. Vascular/Lymphatic: The aorta is unremarkable. No adenopathy is noted. There also appears to be a large collateral vein extending from the confluence of the splenic and  and superior mesenteric veins to the left renal vein. The portal vein does appear to be patent, although these findings are consistent with portal hypertension. Reproductive: Prostate is unremarkable. Other: No abdominal wall hernia or abnormality. No abdominopelvic ascites. Musculoskeletal: No acute or significant osseous findings. IMPRESSION: 1. Hepatic steatosis with hepatomegaly. 2. Moderate splenomegaly is noted with large collateral vein extending from the confluence of the splenic and superior mesenteric veins to the left renal vein. The portal vein does appear to be patent, although these findings are consistent with portal hypertension. Electronically Signed   By: JMarijo ConceptionM.D.   On: 11/01/2019 14:49   DG Chest Portable 1 View  Result Date: 11/01/2019 CLINICAL DATA:  Hemoptysis EXAM: PORTABLE CHEST 1 VIEW COMPARISON:  06/02/2019 FINDINGS: The heart size and mediastinal contours are within normal limits. Both lungs are  clear. The visualized skeletal structures are unremarkable. IMPRESSION: No acute abnormality of the lungs in AP portable projection. Electronically Signed   By: Eddie Candle M.D.   On: 11/01/2019 11:30   US ABDOMEN LIMITED RUQ  Result Date: 11/01/2019 CLINICAL DATA:  Elevated bilirubin EXAM: ULTRASOUND ABDOMEN LIMITED RIGHT UPPER QUADRANT COMPARISON:  None. FINDINGS: Gallbladder: Sludge is present. No gallstones or wall thickening visualized. No sonographic Murphy sign noted by sonographer. Common bile duct: Diameter: 4 mm, normal Liver: No focal lesion identified. Parenchymal hyperechogenicity and heterogeneity. Portal vein is patent on color Doppler imaging with abnormal retrograde direction of blood flow away from the liver. Other: None. IMPRESSION: Gallbladder sludge. Increased liver echogenicity with heterogeneous appearance, which may reflect chronic liver disease or steatosis. The portal vein is patent but with reversal of flow away from the liver. Electronically Signed    By: Macy Mis M.D.   On: 11/01/2019 12:34      Impression / Plan:   Abid Bolla is a 39 y.o. male with alcohol abuse, several admissions with alcohol intoxication presented with alcoholic hepatitis with acute alcoholic liver failure and hematemesis  Hematemesis Peptic ulcer disease or alcoholic gastritis or erosive esophagitis or esophageal varices given evidence of portal hypertension Agree with pantoprazole drip, octreotide drips Continue ceftriaxone for SBP prophylaxis Monitor CBC closely, maintain platelets above 50, hemoglobin above 7 Tentative plan for upper endoscopy tomorrow Maintain n.p.o. after midnight Okay with clear liquids as patient requested  Alcoholic hepatitis with acute alcoholic liver failure Patient does have cirrhosis based on imaging and laboratory data Maddrey's discriminant function is 35.8, If score greater than 32 indicates severe disease with 30-day mortality rate approaching 50% If blood cultures are negative for next 24 hours, recommend methylprednisolone 32 mg daily for 28 days then taper over 2 weeks.  Calculate Lille score on day 5 in order to continue methylprednisolone.  Procalcitonin is negative, Recommend urine analysis  Recommend vitamin K 5 mg p.o. daily for 3 days Continue lactulose 30 mg 3 times daily, titrate to bowel movements 2-3 times per day to prevent AKI Recommend rifaximin 550 mg 2 times daily Monitor daily LFTs, PT/INR, renal function and electrolytes Strongly advised to patient about abstinence from alcohol use  Overall prognosis is guarded   Thank you for involving me in the care of this patient.  GI will follow along with you    LOS: 0 days   Sherri Sear, MD  11/01/2019, 8:32 PM   Note: This dictation was prepared with Dragon dictation along with smaller phrase technology. Any transcriptional errors that result from this process are unintentional.

## 2019-11-01 NOTE — Progress Notes (Signed)
CODE SEPSIS - PHARMACY COMMUNICATION  **Broad Spectrum Antibiotics should be administered within 1 hour of Sepsis diagnosis**  Time Code Sepsis Called/Page Received: 1556 per consult  Antibiotics Ordered: ceftriaxone    Time of 1st antibiotic administration: 1207  Additional action taken by pharmacy:   If necessary, Name of Provider/Nurse Contacted:     Marty Heck ,PharmD Clinical Pharmacist  11/01/2019  3:59 PM

## 2019-11-01 NOTE — ED Notes (Signed)
Pt denies having to urinate at this time.

## 2019-11-01 NOTE — ED Notes (Signed)
Per lab blue top tube that is in lab cannot be used because it was drawn too long ago, resent blue top

## 2019-11-01 NOTE — ED Notes (Signed)
Lab at bedside

## 2019-11-01 NOTE — ED Provider Notes (Signed)
[facility] Emergency Department Provider Note    First MD Initiated Contact with Patient 11/01/19 1054     (approximate)  I have reviewed the triage vital signs and the nursing notes.   HISTORY  Chief Complaint Hemoptysis and Dizziness    HPI Brett Washington is a 39 y.o. male history of alcohol abuse presents to the ER for reported lightheadedness as well as hemoptysis and vomiting.  Patient very poor historian does appear to be having signs of acute withdrawals and in triage was given Ativan due to tremulousness.  Now patient unable to provide much additional history.  Unable to clarify whether this is truly hemoptysis or hematemesis.  He does appear acutely jaundiced.    Past Medical History:  Diagnosis Date  . Alcohol abuse   . Heart murmur   . Seizures (HCC)    Family History  Family history unknown: Yes   Past Surgical History:  Procedure Laterality Date  . HAND SURGERY     Patient Active Problem List   Diagnosis Date Noted  . Hypothermia 06/02/2019  . Hypothermia due to cold environment 06/02/2019  . Alcohol intoxication with delirium (HCC) 06/02/2019  . COVID-19 virus infection 06/02/2019  . LOC (loss of consciousness) (HCC) 01/11/2017  . Alcohol use disorder, severe, dependence (HCC) 11/06/2016  . Aspiration pneumonia due to vomit (HCC)   . Alcohol withdrawal seizure (HCC) 10/31/2016  . Thrombocytopenia (HCC) 10/31/2016  . Transaminitis 10/31/2016  . Hypokalemia 10/31/2016      Prior to Admission medications   Medication Sig Start Date End Date Taking? Authorizing Provider  folic acid (FOLVITE) 1 MG tablet Take 1 tablet (1 mg total) by mouth daily. Patient not taking: Reported on 11/01/2019 06/04/19   Delfino Lovett, MD  thiamine 100 MG tablet Take 1 tablet (100 mg total) by mouth daily. Patient not taking: Reported on 11/01/2019 06/04/19   Delfino Lovett, MD    Allergies Patient has no known allergies.    Social History Social  History   Tobacco Use  . Smoking status: Never Smoker  . Smokeless tobacco: Never Used  Vaping Use  . Vaping Use: Never used  Substance Use Topics  . Alcohol use: Yes    Alcohol/week: 56.0 standard drinks    Types: 56 Cans of beer per week  . Drug use: No    Review of Systems Patient denies headaches, rhinorrhea, blurry vision, numbness, shortness of breath, chest pain, edema, cough, abdominal pain, nausea, vomiting, diarrhea, dysuria, fevers, rashes or hallucinations unless otherwise stated above in HPI. ____________________________________________   PHYSICAL EXAM:  VITAL SIGNS: Vitals:   11/01/19 1011 11/01/19 1335  BP: 117/68 102/64  Pulse: (!) 110 89  Resp: 20   Temp: 99 F (37.2 C)   SpO2: 98%     Constitutional: drowsy, protecting airway, unable to provide additional history Eyes: scleral icterus Head: Atraumatic. Nose: No congestion/rhinnorhea. Mouth/Throat: Mucous membranes are moist.   Neck: No stridor. Painless ROM.  Cardiovascular: Normal rate, regular rhythm. Grossly normal heart sounds.  Good peripheral circulation. Respiratory: Normal respiratory effort.  No retractions. Lungs CTAB. Gastrointestinal: Soft, +distention. No abdominal bruits. No CVA tenderness. Genitourinary: deferred Musculoskeletal: No lower extremity tenderness nor edema.  No joint effusions. Neurologic:   No gross focal neurologic deficits are appreciated. No facial droop Skin:  Skin is warm, dry and intact. No rash noted. Psychiatric: unable to assess  ____________________________________________   LABS (all labs ordered are listed, but only abnormal results are displayed)  Results for  orders placed or performed during the hospital encounter of 11/01/19 (from the past 24 hour(s))  CBC     Status: Abnormal   Collection Time: 11/01/19 10:24 AM  Result Value Ref Range   WBC 14.4 (H) 4.0 - 10.5 K/uL   RBC 3.72 (L) 4.22 - 5.81 MIL/uL   Hemoglobin 13.0 13.0 - 17.0 g/dL   HCT 67.3  (L) 39 - 52 %   MCV 97.3 80.0 - 100.0 fL   MCH 34.9 (H) 26.0 - 34.0 pg   MCHC 35.9 30.0 - 36.0 g/dL   RDW 41.9 (H) 37.9 - 02.4 %   Platelets 114 (L) 150 - 400 K/uL   nRBC 0.0 0.0 - 0.2 %  Comprehensive metabolic panel     Status: Abnormal   Collection Time: 11/01/19 10:24 AM  Result Value Ref Range   Sodium 136 135 - 145 mmol/L   Potassium 2.5 (LL) 3.5 - 5.1 mmol/L   Chloride 100 98 - 111 mmol/L   CO2 25 22 - 32 mmol/L   Glucose, Bld 122 (H) 70 - 99 mg/dL   BUN <5 (L) 6 - 20 mg/dL   Creatinine, Ser 0.97 (L) 0.61 - 1.24 mg/dL   Calcium 7.9 (L) 8.9 - 10.3 mg/dL   Total Protein 7.6 6.5 - 8.1 g/dL   Albumin 2.2 (L) 3.5 - 5.0 g/dL   AST 353 (H) 15 - 41 U/L   ALT 32 0 - 44 U/L   Alkaline Phosphatase 256 (H) 38 - 126 U/L   Total Bilirubin 21.1 (HH) 0.3 - 1.2 mg/dL   GFR calc non Af Amer >60 >60 mL/min   GFR calc Af Amer >60 >60 mL/min   Anion gap 11 5 - 15  Protime-INR     Status: Abnormal   Collection Time: 11/01/19 10:24 AM  Result Value Ref Range   Prothrombin Time 18.4 (H) 11.4 - 15.2 seconds   INR 1.6 (H) 0.8 - 1.2  Type and screen [facility]     Status: None   Collection Time: 11/01/19 10:25 AM  Result Value Ref Range   ABO/RH(D) O POS    Antibody Screen NEG    Sample Expiration      11/04/2019,2359 Performed at [facility], [address] [place], [place] [address]   SARS Coronavirus 2 by RT PCR (hospital order, performed in [facility] hospital lab) Nasopharyngeal Nasopharyngeal Swab     Status: None   Collection Time: 11/01/19 11:45 AM   Specimen: Nasopharyngeal Swab  Result Value Ref Range   SARS Coronavirus 2 NEGATIVE NEGATIVE  Ammonia     Status: Abnormal   Collection Time: 11/01/19 11:45 AM  Result Value Ref Range   Ammonia 117 (H) 9 - 35 umol/L   ____________________________________________  EKG My review and personal interpretation at Time: 10:16   Indication: ams  Rate: 110  Rhythm: sinus Axis: normal Other: poor r  wave progression, no stemi, nonspecific st abn ____________________________________________  RADIOLOGY  I personally reviewed all radiographic images ordered to evaluate for the above acute complaints and reviewed radiology reports and findings.  These findings were personally discussed with the patient.  Please see medical record for radiology report.  ____________________________________________   PROCEDURES  Procedure(s) performed:  .Critical Care Performed by: Willy Eddy, MD Authorized by: Willy Eddy, MD   Critical care provider statement:    Critical care time (minutes):  45   Critical care was necessary to treat or prevent imminent or life-threatening deterioration  of the following conditions:  Hepatic failure   Critical care was time spent personally by me on the following activities:  Discussions with consultants, evaluation of patient's response to treatment, examination of patient, ordering and performing treatments and interventions, ordering and review of laboratory studies, ordering and review of radiographic studies, pulse oximetry, re-evaluation of patient's condition, obtaining history from patient or surrogate and review of old charts      Critical Care performed: yes ____________________________________________   INITIAL IMPRESSION / ASSESSMENT AND PLAN / ED COURSE  Pertinent labs & imaging results that were available during my care of the patient were reviewed by me and considered in my medical decision making (see chart for details).   DDX: Dehydration, sepsis, pna, uti, hypoglycemia, cva, drug effect, withdrawal, encephalitis   Brett Washington is a 39 y.o. who presents to the ED with symptoms as described above.  Patient is very ill-appearing protecting his airway drowsy appears encephalopathic.  Was initially noticed possible alcohol withdrawal as his last drink of alcohol was 3 days ago however patient profoundly jaundiced appearing I am  concerned for acute liver failure.  Blood work for the above differential.  Will order imaging.  Clinical Course as of Oct 31 1501  Mon Nov 01, 2019  1251 Right upper quadrant ultrasound suggestive of cirrhosis. Significantly elevated ammonia   [PR]    Clinical Course User Index [PR] Willy Eddy, MD   ----------------------------------------- 3:02 PM on 11/01/2019 -----------------------------------------  Patient now more alert now that the Ativan given in triage is worn off.  Does have evidence of hepatic encephalopathy with significantly elevated ammonia with elevation of bilirubin as well as INR.  I consulted with GI, Dr. Allegra Lai, who does recommend lactulose, rifaximin as well as vitamin K.  Agrees with plan for admission to this facility.  Agrees with plan for Protonix octreotide IV as well as IV Rocephin.  The patient was evaluated in Emergency Department today for the symptoms described in the history of present illness. He/she was evaluated in the context of the global COVID-19 pandemic, which necessitated consideration that the patient might be at risk for infection with the SARS-CoV-2 virus that causes COVID-19. Institutional protocols and algorithms that pertain to the evaluation of patients at risk for COVID-19 are in a state of rapid change based on information released by regulatory bodies including the CDC and federal and state organizations. These policies and algorithms were followed during the patient's care in the ED.  As part of my medical decision making, I reviewed the following data within the electronic MEDICAL RECORD NUMBER Nursing notes reviewed and incorporated, Labs reviewed, notes from prior ED visits and [place] Controlled Substance Database   ____________________________________________   FINAL CLINICAL IMPRESSION(S) / ED DIAGNOSES  Final diagnoses:  Elevated bilirubin  Hematemesis with nausea  Hepatic encephalopathy (HCC)      NEW MEDICATIONS STARTED  DURING THIS VISIT:  New Prescriptions   No medications on file     Note:  This document was prepared using Dragon voice recognition software and may include unintentional dictation errors.    Willy Eddy, MD 11/01/19 [phone]

## 2019-11-01 NOTE — ED Notes (Signed)
Lab contacted to draw remaining labs

## 2019-11-01 NOTE — H&P (Signed)
History and Physical    Brett Washington YJE:[id number] DOB: 1980-11-29 DOA: 11/01/2019  Referring MD/NP/PA:   PCP: [facility]   Patient coming from:  The patient is coming from home.  At baseline, pt is independent for most of ADL.        Chief Complaint: Hematemesis, altered mental status, seizure  HPI: Brett Washington is a 39 y.o. male with medical history significant of alcohol abuse, alcohol withdrawal seizure, thrombocytopenia, COVID-19 infection, who presents with metaphyses, altered mental status, seizure.  Per his wife (I called his wife by phone), patient has been heavily drinking recently.  He has decreased oral intake.  He has been confused in the past several days.  Patient has had at least 2 episodes of seizure at home per his wife. He moves all extremities.  No facial droop or slurred speech noted. He has nausea and vomiting. He has had several episodes of hematemesis.  His wife did not notice diarrhea.  Not sure if he has abdominal pain.  He does not have active respiratory distress, cough or shortness of breath.  Not sure if patient has chest pain.  He did not complain of symptoms of UTI.  ED Course: pt was found to have hemoglobin 13, WBC 14.4, negative COVID-19 PCR, ammonia level 117, pending FOBT, potassium 2.5, renal function okay, abnormal liver function (ALP 256, AST 164, ALT 32, total bilirubin 21.1), temperature 99, blood pressure 102/64, heart rate 110, RR 20, oxygen saturation 98% on room air.  Chest x-ray negative.  CT head negative for acute intracranial abnormalities.  Patient is admitted to stepdown as inpatient.  Dr. Eula Flax of GI is consulted.  CT-abd/pelvis: 1. Hepatic steatosis with hepatomegaly. 2. Moderate splenomegaly is noted with large collateral vein extending from the confluence of the splenic and superior mesenteric veins to the left renal vein. The portal vein does appear to be patent, although these findings are consistent with  portal Hypertension.  US-RUQ: 1. Gallbladder sludge. 2. Increased liver echogenicity with heterogeneous appearance, which may reflect chronic liver disease or steatosis. The portal vein is patent but with reversal of flow away from the liver  Review of Systems: Could not be reviewed accurately due to altered mental status  Allergy: No Known Allergies  Past Medical History:  Diagnosis Date  . Alcohol abuse   . Heart murmur   . Seizures (HCC)     Past Surgical History:  Procedure Laterality Date  . HAND SURGERY      Social History:  reports that he has never smoked. He has never used smokeless tobacco. He reports current alcohol use of about 56.0 standard drinks of alcohol per week. He reports that he does not use drugs.  Family History:  Family History  Problem Relation Age of Onset  . Diabetes Mellitus II Mother      Prior to Admission medications   Medication Sig Start Date End Date Taking? Authorizing Provider  folic acid (FOLVITE) 1 MG tablet Take 1 tablet (1 mg total) by mouth daily. Patient not taking: Reported on 11/01/2019 06/04/19   Delfino Lovett, MD  thiamine 100 MG tablet Take 1 tablet (100 mg total) by mouth daily. Patient not taking: Reported on 11/01/2019 06/04/19   Delfino Lovett, MD    Physical Exam: Vitals:   11/01/19 1011 11/01/19 1335  BP: 117/68 102/64  Pulse: (!) 110 89  Resp: 20   Temp: 99 F (37.2 C)   TempSrc: Oral   SpO2: 98%   Weight:  80 kg   Height: 5\' 8"  (1.727 m)    General: Not in acute distress HEENT:       Eyes: PERRL, EOMI, has scleral icterus.       ENT: No discharge from the ears and nose, no pharynx injection, no tonsillar enlargement.        Neck: No JVD, no bruit, no mass felt. Heme: No neck lymph node enlargement. Cardiac: S1/S2, RRR, 3/6 systolic murmurs, No gallops or rubs. Respiratory: No rales, wheezing, rhonchi or rubs. GI: Soft, nondistended, nontender, no organomegaly, BS present. GU: No hematuria Ext: No pitting leg  edema bilaterally. 1+DP/PT pulse bilaterally. Musculoskeletal: No joint deformities, No joint redness or warmth, no limitation of ROM in spin. Skin: No rashes.  Neuro: confused, but still oriented to place, Intermittently orientated to time, cranial nerves II-XII grossly intact, moves all extremities  Psych: Patient is not psychotic, no suicidal or hemocidal ideation.  Labs on Admission: I have personally reviewed following labs and imaging studies  CBC: Recent Labs  Lab 11/01/19 1024  WBC 14.4*  HGB 13.0  HCT 36.2*  MCV 97.3  PLT 114*   Basic Metabolic Panel: Recent Labs  Lab 11/01/19 1024 11/01/19 1601  NA 136  --   K 2.5*  --   CL 100  --   CO2 25  --   GLUCOSE 122*  --   BUN <5*  --   CREATININE 0.37*  --   CALCIUM 7.9*  --   PHOS  --  UNABLE TO REPORT DUE TO ICTERUS. QSD   GFR: Estimated Creatinine Clearance: 122.3 mL/min (A) (by C-G formula based on SCr of 0.37 mg/dL (L)). Liver Function Tests: Recent Labs  Lab 11/01/19 1024  AST 164*  ALT 32  ALKPHOS 256*  BILITOT 21.1*  PROT 7.6  ALBUMIN 2.2*   No results for input(s): LIPASE, AMYLASE in the last 168 hours. Recent Labs  Lab 11/01/19 1145  AMMONIA 117*   Coagulation Profile: Recent Labs  Lab 11/01/19 1024  INR 1.6*   Cardiac Enzymes: No results for input(s): CKTOTAL, CKMB, CKMBINDEX, TROPONINI in the last 168 hours. BNP (last 3 results) No results for input(s): PROBNP in the last 8760 hours. HbA1C: No results for input(s): HGBA1C in the last 72 hours. CBG: No results for input(s): GLUCAP in the last 168 hours. Lipid Profile: No results for input(s): CHOL, HDL, LDLCALC, TRIG, CHOLHDL, LDLDIRECT in the last 72 hours. Thyroid Function Tests: No results for input(s): TSH, T4TOTAL, FREET4, T3FREE, THYROIDAB in the last 72 hours. Anemia Panel: No results for input(s): VITAMINB12, FOLATE, FERRITIN, TIBC, IRON, RETICCTPCT in the last 72 hours. Urine analysis:    Component Value Date/Time    COLORURINE YELLOW (A) 06/02/2019 0922   APPEARANCEUR CLEAR (A) 06/02/2019 0922   APPEARANCEUR Clear 11/11/2013 1122   LABSPEC 1.006 06/02/2019 0922   LABSPEC 1.001 11/11/2013 1122   PHURINE 6.0 06/02/2019 0922   GLUCOSEU NEGATIVE 06/02/2019 0922   GLUCOSEU Negative 11/11/2013 1122   HGBUR NEGATIVE 06/02/2019 0922   BILIRUBINUR NEGATIVE 06/02/2019 0922   BILIRUBINUR Negative 11/11/2013 1122   KETONESUR 5 (A) 06/02/2019 0922   PROTEINUR NEGATIVE 06/02/2019 0922   NITRITE NEGATIVE 06/02/2019 0922   LEUKOCYTESUR NEGATIVE 06/02/2019 0922   LEUKOCYTESUR Negative 11/11/2013 1122   Sepsis Labs: @LABRCNTIP (procalcitonin:4,lacticidven:4) ) Recent Results (from the past 240 hour(s))  SARS Coronavirus 2 by RT PCR (hospital order, performed in [facility] hospital lab) Nasopharyngeal Nasopharyngeal Swab     Status: None   Collection  Time: 11/01/19 11:45 AM   Specimen: Nasopharyngeal Swab  Result Value Ref Range Status   SARS Coronavirus 2 NEGATIVE NEGATIVE Final    Comment: (NOTE) SARS-CoV-2 target nucleic acids are NOT DETECTED.  The SARS-CoV-2 RNA is generally detectable in upper and lower respiratory specimens during the acute phase of infection. The lowest concentration of SARS-CoV-2 viral copies this assay can detect is 250 copies / mL. A negative result does not preclude SARS-CoV-2 infection and should not be used as the sole basis for treatment or other patient management decisions.  A negative result may occur with improper specimen collection / handling, submission of specimen other than nasopharyngeal swab, presence of viral mutation(s) within the areas targeted by this assay, and inadequate number of viral copies (<250 copies / mL). A negative result must be combined with clinical observations, patient history, and epidemiological information.  Fact Sheet for Patients:   BoilerBrush.com.cy  Fact Sheet for Healthcare  Providers: https://pope.com/  This test is not yet approved or  cleared by the [place] FDA and has been authorized for detection and/or diagnosis of SARS-CoV-2 by FDA under an Emergency Use Authorization (EUA).  This EUA will remain in effect (meaning this test can be used) for the duration of the COVID-19 declaration under Section 564(b)(1) of the Act, 21 U.S.C. section 360bbb-3(b)(1), unless the authorization is terminated or revoked sooner.  Performed at [facility], [address] [place], [place] [address]      Radiological Exams on Admission: CT HEAD WO CONTRAST  Result Date: 11/01/2019 CLINICAL DATA:  Syncopal episodes EXAM: CT HEAD WITHOUT CONTRAST TECHNIQUE: Contiguous axial images were obtained from the base of the skull through the vertex without intravenous contrast. COMPARISON:  06/02/2019 FINDINGS: Brain: There is no acute intracranial hemorrhage, mass effect, or edema. Gray-white differentiation is preserved. There is no extra-axial fluid collection. There is unchanged slight prominence of the ventricles and sulci compatible with minor parenchymal volume loss greater than expected for age. Vascular: No hyperdense vessel or unexpected calcification. Skull: Calvarium is unremarkable. Sinuses/Orbits: No acute finding. Other: None. IMPRESSION: No acute intracranial abnormality. Electronically Signed   By: Guadlupe Spanish M.D.   On: 11/01/2019 14:47   CT ABDOMEN PELVIS W CONTRAST  Result Date: 11/01/2019 CLINICAL DATA:  Nausea, vomiting. EXAM: CT ABDOMEN AND PELVIS WITH CONTRAST TECHNIQUE: Multidetector CT imaging of the abdomen and pelvis was performed using the standard protocol following bolus administration of intravenous contrast. CONTRAST:  OMNIPAQUE IOHEXOL 300 MG/ML  SOLN COMPARISON:  None. FINDINGS: Lower chest: No acute abnormality. Hepatobiliary: No gallstones or biliary dilatation is noted. Hepatic steatosis is noted.  Hepatomegaly is noted. Pancreas: Unremarkable. No pancreatic ductal dilatation or surrounding inflammatory changes. Spleen: Moderate splenomegaly is noted. Adrenals/Urinary Tract: Adrenal glands are unremarkable. Kidneys are normal, without renal calculi, focal lesion, or hydronephrosis. Bladder is unremarkable. Stomach/Bowel: Stomach is within normal limits. Appendix appears normal. No evidence of bowel wall thickening, distention, or inflammatory changes. Vascular/Lymphatic: The aorta is unremarkable. No adenopathy is noted. There also appears to be a large collateral vein extending from the confluence of the splenic and and superior mesenteric veins to the left renal vein. The portal vein does appear to be patent, although these findings are consistent with portal hypertension. Reproductive: Prostate is unremarkable. Other: No abdominal wall hernia or abnormality. No abdominopelvic ascites. Musculoskeletal: No acute or significant osseous findings. IMPRESSION: 1. Hepatic steatosis with hepatomegaly. 2. Moderate splenomegaly is noted with large collateral vein extending from the confluence of the splenic  and superior mesenteric veins to the left renal vein. The portal vein does appear to be patent, although these findings are consistent with portal hypertension. Electronically Signed   By: Lupita Raider M.D.   On: 11/01/2019 14:49   DG Chest Portable 1 View  Result Date: 11/01/2019 CLINICAL DATA:  Hemoptysis EXAM: PORTABLE CHEST 1 VIEW COMPARISON:  06/02/2019 FINDINGS: The heart size and mediastinal contours are within normal limits. Both lungs are clear. The visualized skeletal structures are unremarkable. IMPRESSION: No acute abnormality of the lungs in AP portable projection. Electronically Signed   By: Lauralyn Primes M.D.   On: 11/01/2019 11:30   US ABDOMEN LIMITED RUQ  Result Date: 11/01/2019 CLINICAL DATA:  Elevated bilirubin EXAM: ULTRASOUND ABDOMEN LIMITED RIGHT UPPER QUADRANT COMPARISON:  None.  FINDINGS: Gallbladder: Sludge is present. No gallstones or wall thickening visualized. No sonographic Murphy sign noted by sonographer. Common bile duct: Diameter: 4 mm, normal Liver: No focal lesion identified. Parenchymal hyperechogenicity and heterogeneity. Portal vein is patent on color Doppler imaging with abnormal retrograde direction of blood flow away from the liver. Other: None. IMPRESSION: Gallbladder sludge. Increased liver echogenicity with heterogeneous appearance, which may reflect chronic liver disease or steatosis. The portal vein is patent but with reversal of flow away from the liver. Electronically Signed   By: Guadlupe Spanish M.D.   On: 11/01/2019 12:34     EKG: Independently reviewed.  Sinus rhythm, QTC 481, tachycardia, LAD, poor R wave progression  Assessment/Plan Principal Problem:   Hematemesis Active Problems:   Thrombocytopenia (HCC)   Hypokalemia   Acute hepatic encephalopathy   Leukocytosis   Abnormal LFTs   Seizure (HCC)   Alcohol abuse   Hematemesis: Hemoglobin 13.0.  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 Dr. Allegra Lai for GI is consulted.   - will admitted to SDU as inpatient - GI consulted by Ed, will follow up recommendations - IVF: 500 ml of NS bolus, then at 125 mL/hr - Start IV pantoprazole gtt - start Octreotide gtt - 1 mg of Vk is given in ED - Zofran IV for nausea - Avoid NSAIDs and SQ heparin - Maintain IV access (2 large bore IVs if possible). - Monitor closely and follow q6h cbc, transfuse as necessary, if Hgb<7.0 - LaB: INR, PTT and type screen  Acute hepatic encephalopathy: ammonia level 117.  CT head is negative for acute intracranial abnormalities, his altered mental status most likely due to hepatic encephalopathy. -Frequent neuro check -Lactulose 30 g 3 times daily  Leukocytosis: WBC 14.4.  Temperature 99.  No source of infection identified, but in the setting  of hepatic encephalopathy and GI bleeding, will start antibiotics empirically -Patient received 1 dose of rifaximin in ED -Start IV Rocephin -Follow-up of blood culture -Follow-up urinalysis -Check procalcitonin and lactic acid level  Seizure: Likely due to alcohol withdrawal seizure -Seizure precaution -When necessary Ativan for seizure -Start Keppra 1000 mg twice daily  Thrombocytopenia (HCC): Most likely due to alcohol abuse -f/u by CBC  Hypokalemia: K= 2.5  on admission. - Repleted with 10 mEq of IV KCl and 40 mEq x 2 orally - Check Mg level and phosphorus level - Give 1 g of magnesium sulfate now  Abnormal LFTs: INR 1.6. ALP 256, AST 164, ALT 32, total bilirubin 21.1, indicating acute liver failure. -Avoid using liver toxic medications, such as Tylenol -Follow-up GIs recommendation  Alcohol abuse: -CiWA protocol     DVT ppx: SCD  Code Status: Full code Family Communication: Yes, patient's wife by phone Disposition Plan:  Anticipate discharge back to previous environment Consults called:  Dr.Vanga Admission status:  SDU/inpation       Status is: Inpatient  Remains inpatient appropriate because:Inpatient level of care appropriate due to severity of illness   Dispo: The patient is from: Home              Anticipated d/c is to: Home              Anticipated d/c date is: 2 days              Patient currently is not medically stable to d/c.         Date of Service 11/01/2019    Lorretta HarpXilin Darcey Cardy [facility]   If 7PM-7AM, please contact night-coverage [email or web address] 11/01/2019, 5:07 PM

## 2019-11-01 NOTE — ED Notes (Signed)
IV team at bedside 

## 2019-11-01 NOTE — ED Notes (Signed)
U/s at bedside

## 2019-11-01 NOTE — ED Notes (Signed)
Per pharmacy octreotide and magnesium are compatible

## 2019-11-01 NOTE — ED Notes (Signed)
Attempted IV access x 2 without success.

## 2019-11-01 NOTE — ED Notes (Signed)
Lab contacted regarding pending PT-INR, per lab they will run it now

## 2019-11-01 NOTE — ED Notes (Signed)
Pt otf for imaging 

## 2019-11-01 NOTE — ED Triage Notes (Addendum)
Patient reports dizziness, multiple syncopal episodes that have been going on "for a while". States he has been coughing up bright red blood. Reports decreased appetite. Patient states he was recently told he had liver problems but unsure as to what.   Patient states his last drink was 4 days ago. Visible tremors noted. Patients eye and skin appear jaundiced.

## 2019-11-02 ENCOUNTER — Inpatient Hospital Stay: Payer: Self-pay | Admitting: Anesthesiology

## 2019-11-02 ENCOUNTER — Encounter
Admission: EM | Disposition: A | Discharge status: Home / Self Care | Payer: Self-pay | Source: Home / Self Care | Attending: Internal Medicine

## 2019-11-02 ENCOUNTER — Encounter: Payer: Self-pay | Admitting: Internal Medicine

## 2019-11-02 DIAGNOSIS — K766 Portal hypertension: Secondary | ICD-10-CM

## 2019-11-02 DIAGNOSIS — K3189 Other diseases of stomach and duodenum: Secondary | ICD-10-CM

## 2019-11-02 HISTORY — PX: ESOPHAGOGASTRODUODENOSCOPY (EGD) WITH PROPOFOL: SHX5813

## 2019-11-02 LAB — COMPREHENSIVE METABOLIC PANEL
ALT: 28 U/L (ref 0–44)
AST: 147 U/L — ABNORMAL HIGH (ref 15–41)
Albumin: 2 g/dL — ABNORMAL LOW (ref 3.5–5.0)
Alkaline Phosphatase: 219 U/L — ABNORMAL HIGH (ref 38–126)
Anion gap: 6 (ref 5–15)
BUN: <5 mg/dL — ABNORMAL LOW (ref 6–20)
CO2: 25 mmol/L (ref 22–32)
Calcium: 7.3 mg/dL — ABNORMAL LOW (ref 8.9–10.3)
Chloride: 109 mmol/L (ref 98–111)
Creatinine, Ser: <0.3 mg/dL — ABNORMAL LOW (ref 0.61–1.24)
Glucose, Bld: 95 mg/dL (ref 70–99)
Potassium: 3.2 mmol/L — ABNORMAL LOW (ref 3.5–5.1)
Sodium: 140 mmol/L (ref 135–145)
Total Bilirubin: 19.6 mg/dL (ref 0.3–1.2)
Total Protein: 6.7 g/dL (ref 6.5–8.1)

## 2019-11-02 LAB — CBC
HCT: 31.1 % — ABNORMAL LOW (ref 39.0–52.0)
HCT: 30.8 % — ABNORMAL LOW (ref 39.0–52.0)
HCT: 33.2 % — ABNORMAL LOW (ref 39.0–52.0)
Hemoglobin: 10.9 g/dL — ABNORMAL LOW (ref 13.0–17.0)
Hemoglobin: 10.9 g/dL — ABNORMAL LOW (ref 13.0–17.0)
Hemoglobin: 11.5 g/dL — ABNORMAL LOW (ref 13.0–17.0)
MCH: 35.7 pg — ABNORMAL HIGH (ref 26.0–34.0)
MCH: 35.7 pg — ABNORMAL HIGH (ref 26.0–34.0)
MCH: 35.3 pg — ABNORMAL HIGH (ref 26.0–34.0)
MCHC: 35 g/dL (ref 30.0–36.0)
MCHC: 35.4 g/dL (ref 30.0–36.0)
MCHC: 34.6 g/dL (ref 30.0–36.0)
MCV: 102 fL — ABNORMAL HIGH (ref 80.0–100.0)
MCV: 101 fL — ABNORMAL HIGH (ref 80.0–100.0)
MCV: 101.8 fL — ABNORMAL HIGH (ref 80.0–100.0)
Platelets: 85 10*3/uL — ABNORMAL LOW (ref 150–400)
Platelets: 87 10*3/uL — ABNORMAL LOW (ref 150–400)
Platelets: 84 10*3/uL — ABNORMAL LOW (ref 150–400)
RBC: 3.05 MIL/uL — ABNORMAL LOW (ref 4.22–5.81)
RBC: 3.05 MIL/uL — ABNORMAL LOW (ref 4.22–5.81)
RBC: 3.26 MIL/uL — ABNORMAL LOW (ref 4.22–5.81)
RDW: 17.2 % — ABNORMAL HIGH (ref 11.5–15.5)
RDW: 17 % — ABNORMAL HIGH (ref 11.5–15.5)
RDW: 16.5 % — ABNORMAL HIGH (ref 11.5–15.5)
WBC: 11.3 10*3/uL — ABNORMAL HIGH (ref 4.0–10.5)
WBC: 11.6 10*3/uL — ABNORMAL HIGH (ref 4.0–10.5)
WBC: 10.1 10*3/uL (ref 4.0–10.5)
nRBC: 0 % (ref 0.0–0.2)
nRBC: 0 % (ref 0.0–0.2)
nRBC: 0 % (ref 0.0–0.2)

## 2019-11-02 LAB — LACTIC ACID, PLASMA
Lactic Acid, Venous: 2.3 mmol/L (ref 0.5–1.9)
Lactic Acid, Venous: 2.2 mmol/L (ref 0.5–1.9)
Lactic Acid, Venous: 2.3 mmol/L (ref 0.5–1.9)
Lactic Acid, Venous: 2.5 mmol/L (ref 0.5–1.9)
Lactic Acid, Venous: 1.8 mmol/L (ref 0.5–1.9)
Lactic Acid, Venous: 1.5 mmol/L (ref 0.5–1.9)

## 2019-11-02 LAB — MAGNESIUM: Magnesium: 2 mg/dL (ref 1.7–2.4)

## 2019-11-02 LAB — PHOSPHORUS: Phosphorus: UNDETERMINED mg/dL (ref 2.5–4.6)

## 2019-11-02 SURGERY — ESOPHAGOGASTRODUODENOSCOPY (EGD) WITH PROPOFOL
Anesthesia: General

## 2019-11-02 MED ORDER — FOLIC ACID 1 MG PO TABS
1.0000 mg | ORAL_TABLET | Freq: Every day | ORAL | Status: DC
  Administered 2019-11-03: 1 mg via ORAL
  Filled 2019-11-02 (×2): qty 1

## 2019-11-02 MED ORDER — ENSURE ENLIVE PO LIQD
237.0000 mL | Freq: Three times a day (TID) | ORAL | Status: DC
  Administered 2019-11-02 – 2019-11-09 (×10): 237 mL via ORAL

## 2019-11-02 MED ORDER — LIDOCAINE HCL (CARDIAC) PF 100 MG/5ML IV SOSY
PREFILLED_SYRINGE | INTRAVENOUS | Status: DC | PRN
  Administered 2019-11-02: 100 mg via INTRAVENOUS

## 2019-11-02 MED ORDER — ADULT MULTIVITAMIN W/MINERALS CH
1.0000 | ORAL_TABLET | Freq: Every day | ORAL | Status: DC
  Administered 2019-11-03 – 2019-11-09 (×7): 1 via ORAL
  Filled 2019-11-02 (×8): qty 1

## 2019-11-02 MED ORDER — METHYLPREDNISOLONE SODIUM SUCC 40 MG IJ SOLR
32.0000 mg | INTRAMUSCULAR | Status: DC
  Administered 2019-11-02 – 2019-11-04 (×3): 32 mg via INTRAVENOUS
  Filled 2019-11-02 (×3): qty 1

## 2019-11-02 MED ORDER — SODIUM CHLORIDE 0.9 % IV SOLN: INTRAVENOUS | Status: DC

## 2019-11-02 MED ORDER — PROPOFOL 500 MG/50ML IV EMUL
INTRAVENOUS | Status: DC | PRN
  Administered 2019-11-02: 88 ug/kg/min via INTRAVENOUS

## 2019-11-02 MED ORDER — THIAMINE HCL 100 MG/ML IJ SOLN
500.0000 mg | INTRAVENOUS | Status: AC
  Administered 2019-11-02 – 2019-11-04 (×3): 500 mg via INTRAVENOUS
  Filled 2019-11-02 (×3): qty 5

## 2019-11-02 MED ORDER — POTASSIUM CHLORIDE 10 MEQ/100ML IV SOLN
10.0000 meq | INTRAVENOUS | Status: AC
  Administered 2019-11-02 (×4): 10 meq via INTRAVENOUS
  Filled 2019-11-02 (×4): qty 100

## 2019-11-02 NOTE — Anesthesia Preprocedure Evaluation (Addendum)
Anesthesia Evaluation  Patient identified by MRN, date of birth, ID band Patient awake    Reviewed: Allergy & Precautions, H&P , NPO status , Patient's Chart, lab work & pertinent test results  Airway Mallampati: III  TM Distance: >3 FB     Dental  (+) Poor Dentition   Pulmonary neg pulmonary ROS, neg COPD, Not current smoker,    breath sounds clear to auscultation       Cardiovascular (-) angina+ Valvular Problems/Murmurs  Rhythm:regular + Systolic murmurs    Neuro/Psych Seizures - (alcohol withdrawal),  negative psych ROS   GI/Hepatic (+) Cirrhosis     substance abuse  alcohol use, Alcoholic hepatitis with acute liver failure. Presented with hematemesis Evidence of portal hypertension on CT   Endo/Other  negative endocrine ROS  Renal/GU negative Renal ROS  negative genitourinary   Musculoskeletal   Abdominal   Peds  Hematology  (+) Blood dyscrasia, anemia , thrombocytopenia   Anesthesia Other Findings No vomiting today  Past Medical History: No date: Alcohol abuse No date: Heart murmur No date: Seizures (HCC)  Past Surgical History: No date: HAND SURGERY  BMI    Body Mass Index: 26.18 kg/m      Reproductive/Obstetrics negative OB ROS                          Anesthesia Physical Anesthesia Plan  ASA: IV  Anesthesia Plan: General   Post-op Pain Management:    Induction:   PONV Risk Score and Plan: Propofol infusion and TIVA  Airway Management Planned: Nasal Cannula  Additional Equipment:   Intra-op Plan:   Post-operative Plan:   Informed Consent: I have reviewed the patients History and Physical, chart, labs and discussed the procedure including the risks, benefits and alternatives for the proposed anesthesia with the patient or authorized representative who has indicated his/her understanding and acceptance.     Dental Advisory Given  Plan Discussed with:  Anesthesiologist, CRNA and Surgeon  Anesthesia Plan Comments:         Anesthesia Quick Evaluation

## 2019-11-02 NOTE — Transfer of Care (Signed)
Immediate Anesthesia Transfer of Care Note  Patient: Khaleem Burchill  Procedure(s) Performed: ESOPHAGOGASTRODUODENOSCOPY (EGD) WITH PROPOFOL (N/A )  Patient Location: PACU and Endoscopy Unit  Anesthesia Type:General  Level of Consciousness: sedated  Airway & Oxygen Therapy: Patient Spontanous Breathing and Patient connected to nasal cannula oxygen  Post-op Assessment: Report given to RN and Post -op Vital signs reviewed and stable  Post vital signs: Reviewed and stable  Last Vitals:  Vitals Value Taken Time  BP 107/50 11/02/19 1316  Temp    Pulse 97 11/02/19 1317  Resp 23 11/02/19 1317  SpO2 95 % 11/02/19 1317  Vitals shown include unvalidated device data.  Last Pain:  Vitals:   11/02/19 1218  TempSrc: Temporal  PainSc: 0-No pain         Complications: No complications documented.

## 2019-11-02 NOTE — Anesthesia Postprocedure Evaluation (Signed)
Anesthesia Post Note  Patient: Brett Washington  Procedure(s) Performed: ESOPHAGOGASTRODUODENOSCOPY (EGD) WITH PROPOFOL (N/A )  Patient location during evaluation: PACU Anesthesia Type: General Level of consciousness: awake and alert Pain management: pain level controlled Vital Signs Assessment: post-procedure vital signs reviewed and stable Respiratory status: spontaneous breathing, nonlabored ventilation and respiratory function stable Cardiovascular status: blood pressure returned to baseline and stable Postop Assessment: no apparent nausea or vomiting Anesthetic complications: no   No complications documented.   Last Vitals:  Vitals:   11/02/19 1326 11/02/19 1349  BP: (!) 100/48 105/72  Pulse:  88  Resp:  18  Temp:  (!) 36.4 C  SpO2:  100%    Last Pain:  Vitals:   11/02/19 1349  TempSrc: Temporal  PainSc: 0-No pain                 Aurelio Brash Fredia Chittenden

## 2019-11-02 NOTE — Progress Notes (Signed)
PROGRESS NOTE    Brett Washington  BOF:[id number] DOB: October 29, 1980 DOA: 11/01/2019 PCP: [facility]    Assessment & Plan:   Principal Problem:   Hematemesis Active Problems:   Thrombocytopenia (HCC)   Hypokalemia   Acute hepatic encephalopathy   Leukocytosis   Abnormal LFTs   Seizure (HCC)   Alcohol abuse  Hematemesis: secondary to PUD vs alcoholic gastritis vs esophagitis vs esophageal varices. Continue on IVFs. Continue on IV protonix & IV octreotide. GI following and recs apprec  Alcoholic hepatitis: w/ portal HTN as per CT scan. If blood cxs NGTD x 24 hrs, will start methylprednisolone daily for 28 days & then taper over 2 weeks as per GI   Alcohol abuse: alcohol cessation counseling. Continue on ceftriaxone for SBP prophylaxis. Continue on IV high dose thiamine. Continue on CIWA protocol   Acute hepatic encephalopathy: ammonia is elevated. CT head neg for any acute intracranial abnormalities. Continue on lactulose, xifaxan  Hypokalemia: KCl repleted. Will continue to monitor  Transaminitis: secondary to alcohol abuse. ALT is WNL, AST is elevated. Will continue to monitor   Elevated lactic acid: continue on IVFs. Repeat lactic acid ordered   Macrocytic anemia: secondary to alcohol abuse. No need for a transfusion at this time  Thrombocytopenia: secondary to alcohol abuse. Will continue to monitor  Leukocytosis: likely reactive. Will continue to monitor   Hyperbilirubinemia: secondary to alcohol abuse. Will continue to monitor   DVT prophylaxis: SCDs Code Status: full  Family Communication:  Disposition Plan: depends on PT/OT rec   Consultants:   GI    Procedures:    Antimicrobials: ceftriaxone   Subjective: Pt c/o weakness  Objective: Vitals:   11/01/19 2359 11/02/19 0521 11/02/19 0609 11/02/19 0720  BP: (!) 91/51 117/63  102/60  Pulse: 99 (!) 108  91  Resp: 17 18  16   Temp: 99.2 F (37.3 C) 98.9 F (37.2 C)  98.3 F  (36.8 C)  TempSrc: Oral Oral    SpO2: 99% 100%  99%  Weight:   78.1 kg   Height:        Intake/Output Summary (Last 24 hours) at 11/02/2019 0809 Last data filed at 11/02/2019 0716 Gross per 24 hour  Intake 2036.68 ml  Output 600 ml  Net 1436.68 ml   Filed Weights   11/01/19 1011 11/02/19 0609  Weight: 80 kg 78.1 kg    Examination:  General exam: Appears calm and comfortable  Respiratory system: Clear to auscultation. No rales, wheezes Cardiovascular system: S1 & S2+. No  rubs, gallops or clicks.  Gastrointestinal system: Abdomen is nondistended, soft and nontender.  Normal bowel sounds heard. Central nervous system: lethargic. Moves all 4 extremities Psychiatry: Judgement and insight appear abnormal. Flat mood and affect    Data Reviewed: I have personally reviewed following labs and imaging studies  CBC: Recent Labs  Lab 11/01/19 1024 11/01/19 1702 11/01/19 2208  WBC 14.4* 13.5* 10.6*  HGB 13.0 12.4* 12.6*  HCT 36.2* 35.7* 36.8*  MCV 97.3 101.4* 102.5*  PLT 114* 102* 95*   Basic Metabolic Panel: Recent Labs  Lab 11/01/19 1024 11/01/19 1601 11/01/19 1702 11/02/19 0111  NA 136  --   --  140  K 2.5*  --   --  3.2*  CL 100  --   --  109  CO2 25  --   --  25  GLUCOSE 122*  --   --  95  BUN <5*  --   --  <5*  CREATININE 0.37*  --   --  <0.30*  CALCIUM 7.9*  --   --  7.3*  MG  --   --  2.0 2.0  PHOS  --  UNABLE TO REPORT DUE TO ICTERUS. QSD  --  UNABLE TO REPORT DUE TO ICTERUS / JAG   GFR: CrCl cannot be calculated (This lab value cannot be used to calculate CrCl because it is not a number: <0.30). Liver Function Tests: Recent Labs  Lab 11/01/19 1024 11/02/19 0111  AST 164* 147*  ALT 32 28  ALKPHOS 256* 219*  BILITOT 21.1* 19.6*  PROT 7.6 6.7  ALBUMIN 2.2* 2.0*   No results for input(s): LIPASE, AMYLASE in the last 168 hours. Recent Labs  Lab 11/01/19 1145  AMMONIA 117*   Coagulation Profile: Recent Labs  Lab 11/01/19 1024  INR 1.6*    Cardiac Enzymes: No results for input(s): CKTOTAL, CKMB, CKMBINDEX, TROPONINI in the last 168 hours. BNP (last 3 results) No results for input(s): PROBNP in the last 8760 hours. HbA1C: No results for input(s): HGBA1C in the last 72 hours. CBG: No results for input(s): GLUCAP in the last 168 hours. Lipid Profile: No results for input(s): CHOL, HDL, LDLCALC, TRIG, CHOLHDL, LDLDIRECT in the last 72 hours. Thyroid Function Tests: No results for input(s): TSH, T4TOTAL, FREET4, T3FREE, THYROIDAB in the last 72 hours. Anemia Panel: Recent Labs    11/01/19 2208  FOLATE 5.9*   Sepsis Labs: Recent Labs  Lab 11/01/19 1601 11/01/19 1702 11/01/19 2223 11/02/19 0111 11/02/19 0417 11/02/19 0714  PROCALCITON 0.25  --   --   --   --   --   LATICACIDVEN  --    < > 2.8* 2.3* 2.2* 2.3*   < > = values in this interval not displayed.    Recent Results (from the past 240 hour(s))  SARS Coronavirus 2 by RT PCR (hospital order, performed in [facility] hospital lab) Nasopharyngeal Nasopharyngeal Swab     Status: None   Collection Time: 11/01/19 11:45 AM   Specimen: Nasopharyngeal Swab  Result Value Ref Range Status   SARS Coronavirus 2 NEGATIVE NEGATIVE Final    Comment: (NOTE) SARS-CoV-2 target nucleic acids are NOT DETECTED.  The SARS-CoV-2 RNA is generally detectable in upper and lower respiratory specimens during the acute phase of infection. The lowest concentration of SARS-CoV-2 viral copies this assay can detect is 250 copies / mL. A negative result does not preclude SARS-CoV-2 infection and should not be used as the sole basis for treatment or other patient management decisions.  A negative result may occur with improper specimen collection / handling, submission of specimen other than nasopharyngeal swab, presence of viral mutation(s) within the areas targeted by this assay, and inadequate number of viral copies (<250 copies / mL). A negative result must be combined with  clinical observations, patient history, and epidemiological information.  Fact Sheet for Patients:   BoilerBrush.com.cy  Fact Sheet for Healthcare Providers: https://pope.com/  This test is not yet approved or  cleared by the [place] FDA and has been authorized for detection and/or diagnosis of SARS-CoV-2 by FDA under an Emergency Use Authorization (EUA).  This EUA will remain in effect (meaning this test can be used) for the duration of the COVID-19 declaration under Section 564(b)(1) of the Act, 21 U.S.C. section 360bbb-3(b)(1), unless the authorization is terminated or revoked sooner.  Performed at [facility], [address] [place], [place] [address]  Radiology Studies: CT HEAD WO CONTRAST  Result Date: 11/01/2019 CLINICAL DATA:  Syncopal episodes EXAM: CT HEAD WITHOUT CONTRAST TECHNIQUE: Contiguous axial images were obtained from the base of the skull through the vertex without intravenous contrast. COMPARISON:  06/02/2019 FINDINGS: Brain: There is no acute intracranial hemorrhage, mass effect, or edema. Gray-white differentiation is preserved. There is no extra-axial fluid collection. There is unchanged slight prominence of the ventricles and sulci compatible with minor parenchymal volume loss greater than expected for age. Vascular: No hyperdense vessel or unexpected calcification. Skull: Calvarium is unremarkable. Sinuses/Orbits: No acute finding. Other: None. IMPRESSION: No acute intracranial abnormality. Electronically Signed   By: Guadlupe Spanish M.D.   On: 11/01/2019 14:47   CT ABDOMEN PELVIS W CONTRAST  Result Date: 11/01/2019 CLINICAL DATA:  Nausea, vomiting. EXAM: CT ABDOMEN AND PELVIS WITH CONTRAST TECHNIQUE: Multidetector CT imaging of the abdomen and pelvis was performed using the standard protocol following bolus administration of intravenous contrast. CONTRAST:  OMNIPAQUE IOHEXOL 300  MG/ML  SOLN COMPARISON:  None. FINDINGS: Lower chest: No acute abnormality. Hepatobiliary: No gallstones or biliary dilatation is noted. Hepatic steatosis is noted. Hepatomegaly is noted. Pancreas: Unremarkable. No pancreatic ductal dilatation or surrounding inflammatory changes. Spleen: Moderate splenomegaly is noted. Adrenals/Urinary Tract: Adrenal glands are unremarkable. Kidneys are normal, without renal calculi, focal lesion, or hydronephrosis. Bladder is unremarkable. Stomach/Bowel: Stomach is within normal limits. Appendix appears normal. No evidence of bowel wall thickening, distention, or inflammatory changes. Vascular/Lymphatic: The aorta is unremarkable. No adenopathy is noted. There also appears to be a large collateral vein extending from the confluence of the splenic and and superior mesenteric veins to the left renal vein. The portal vein does appear to be patent, although these findings are consistent with portal hypertension. Reproductive: Prostate is unremarkable. Other: No abdominal wall hernia or abnormality. No abdominopelvic ascites. Musculoskeletal: No acute or significant osseous findings. IMPRESSION: 1. Hepatic steatosis with hepatomegaly. 2. Moderate splenomegaly is noted with large collateral vein extending from the confluence of the splenic and superior mesenteric veins to the left renal vein. The portal vein does appear to be patent, although these findings are consistent with portal hypertension. Electronically Signed   By: Lupita Raider M.D.   On: 11/01/2019 14:49   DG Chest Portable 1 View  Result Date: 11/01/2019 CLINICAL DATA:  Hemoptysis EXAM: PORTABLE CHEST 1 VIEW COMPARISON:  06/02/2019 FINDINGS: The heart size and mediastinal contours are within normal limits. Both lungs are clear. The visualized skeletal structures are unremarkable. IMPRESSION: No acute abnormality of the lungs in AP portable projection. Electronically Signed   By: Lauralyn Primes M.D.   On: 11/01/2019 11:30    US ABDOMEN LIMITED RUQ  Result Date: 11/01/2019 CLINICAL DATA:  Elevated bilirubin EXAM: ULTRASOUND ABDOMEN LIMITED RIGHT UPPER QUADRANT COMPARISON:  None. FINDINGS: Gallbladder: Sludge is present. No gallstones or wall thickening visualized. No sonographic Murphy sign noted by sonographer. Common bile duct: Diameter: 4 mm, normal Liver: No focal lesion identified. Parenchymal hyperechogenicity and heterogeneity. Portal vein is patent on color Doppler imaging with abnormal retrograde direction of blood flow away from the liver. Other: None. IMPRESSION: Gallbladder sludge. Increased liver echogenicity with heterogeneous appearance, which may reflect chronic liver disease or steatosis. The portal vein is patent but with reversal of flow away from the liver. Electronically Signed   By: Guadlupe Spanish M.D.   On: 11/01/2019 12:34        Scheduled Meds: . feeding supplement  1 Container Oral TID BM  .  lactulose  30 g Oral TID  . LORazepam  0-4 mg Intravenous Q6H   Or  . LORazepam  0-4 mg Oral Q6H  . [START ON 11/03/2019] LORazepam  0-4 mg Intravenous Q12H   Or  . [START ON 11/03/2019] LORazepam  0-4 mg Oral Q12H  . [START ON 11/04/2019] pantoprazole  40 mg Intravenous Q12H  . phytonadione  5 mg Oral Daily  . rifaximin  550 mg Oral BID  . thiamine  100 mg Oral Daily   Or  . thiamine  100 mg Intravenous Daily   Continuous Infusions: . sodium chloride 100 mL/hr at 11/02/19 0716  . sodium chloride Stopped (11/02/19 [id number])  . cefTRIAXone (ROCEPHIN)  IV    . levETIRAcetam Stopped (11/02/19 [id number])  . octreotide  (SANDOSTATIN)    IV infusion 50 mcg/hr (11/02/19 0716)  . pantoprozole (PROTONIX) infusion 8 mg/hr (11/02/19 0716)     LOS: 1 day    Time spent: 34 mins     Charise KillianJamiese M Nayelis Bonito, MD [facility] Pager [phone]  If 7PM-7AM, please contact night-coverage [email or web address]  11/02/2019, 8:09 AM

## 2019-11-02 NOTE — Progress Notes (Signed)
Initial Nutrition Assessment  DOCUMENTATION CODES:   Not applicable  INTERVENTION:   Ensure Enlive po TID, each supplement provides 350 kcal and 20 grams of protein  MVI, thiamine and folic acid daily in setting of etoh abuse  Recommend 500 mg of thiamine intravenously, infused over 30 minutes, three times daily for two consecutive days and 250 mg intravenously once daily for an additional five days  Pt likely at high refeed risk; recommend monitor K, Mg and P labs daily as oral intake improves.   NUTRITION DIAGNOSIS:   Inadequate oral intake related to acute illness as evidenced by meal completion < 25%.  GOAL:   Patient will meet greater than or equal to 90% of their needs  MONITOR:   PO intake, Supplement acceptance, Labs, Weight trends, Skin, I & O's, Diet advancement  REASON FOR ASSESSMENT:   Malnutrition Screening Tool    ASSESSMENT:   39 y.o. male with medical history significant of alcohol abuse, alcohol withdrawal seizure, thrombocytopenia, COVID-19 infection who presents with metaphyses, altered mental status and seizure.   Pt s/p EGD today; pt found to have portal hypertensive gastropathy, LA Grade A reflux, acute and erosive esophagitis with no bleeding and small hiatal hernia.  Met with pt in room today. Pt sleeping at time of RD visit so did not wake patient. Spoke to RN who reports that pt did not eat much breakfast this morning and he did not drink his Boost supplement. RD suspects pt with poor appetite and oral intake at baseline r/t etoh abuse. Per chart, pt is down 23lbs(12%) over the past year; RD unsure how recently weight loss occurred. Pt advanced to a 2 gram sodium diet after EGD; RD will change Boost over to Ensure. RD will also add vitamins in setting of etoh abuse. Would recommend high dose IV thiamine as pt is at risk for Wernicke's. Pt is also at high refeed risk; recommend monitor electrolytes daily.   Medications reviewed and include: lactulose,  protonix, thiamine, NaCl _0 /hr, KCl   Labs reviewed: K 3.2(L), BUN <5(L), creat <0.30(L), alk phos 219(H), AST 147(H), tbili 19.6(H), Mg 2.0 wnl Wbc- 11.3(H), Hgb 10.9(L), Hct 31.1(L), MCV 102.0(H), MCH 35.7(H)  NUTRITION - FOCUSED PHYSICAL EXAM:    Most Recent Value  Orbital Region No depletion  Upper Arm Region No depletion  Thoracic and Lumbar Region No depletion  Buccal Region No depletion  Temple Region No depletion  Clavicle Bone Region No depletion  Clavicle and Acromion Bone Region No depletion  Scapular Bone Region No depletion  Dorsal Hand No depletion  Patellar Region Unable to assess  Anterior Thigh Region Unable to assess  Posterior Calf Region Unable to assess  Edema (RD Assessment) None  Hair Reviewed  Eyes Reviewed  Mouth Reviewed  Skin Reviewed  Nails Reviewed     Diet Order:   Diet Order            Diet 2 gram sodium Room service appropriate? Yes; Fluid consistency: Thin  Diet effective now                EDUCATION NEEDS:   Not appropriate for education at this time  Skin:  Skin Assessment: Reviewed RN Assessment  Last BM:  pta  Height:   Ht Readings from Last 1 Encounters:  11/01/19 _1  (1.727 m)    Weight:   Wt Readings from Last 1 Encounters:  11/02/19 78.1 kg    Ideal Body Weight:  70 kg  BMI:  Body  mass index is 26.18 kg/m.  Estimated Nutritional Needs:   Kcal:  2000-2300kcal/day  Protein:  100-115g/day  Fluid:  >2.1L/day  Koleen Distance MS, RD, LDN Please refer to [facility] for RD and/or RD on-call/weekend/after hours pager

## 2019-11-02 NOTE — Op Note (Addendum)
[facility] Gastroenterology Patient Name: Brett Washington Procedure Date: 11/02/2019 12:55 PM MRN: [id number] Account #: [id number] Date of Birth: [birth year] Admit Type: Outpatient Age: 39 Room: [facility] ENDO ROOM 1 Gender: Male Note Status: Finalized Procedure:             Upper GI endoscopy Indications:           Hematemesis Providers:             Lin Landsman MD, MD Medicines:             Monitored Anesthesia Care Complications:         No immediate complications. Estimated blood loss: None. Procedure:             Pre-Anesthesia Assessment:                        - Prior to the procedure, a History and Physical was                         performed, and patient medications and allergies were                         reviewed. The patient is competent. The risks and                         benefits of the procedure and the sedation options and                         risks were discussed with the patient. All questions                         were answered and informed consent was obtained.                         Patient identification and proposed procedure were                         verified by the physician, the nurse, the                         anesthesiologist, the anesthetist and the technician                         in the pre-procedure area in the procedure room in the                         endoscopy suite. Mental Status Examination: alert and                         oriented. Airway Examination: normal oropharyngeal                         airway and neck mobility. Respiratory Examination:                         clear to auscultation. CV Examination: normal.                         Prophylactic Antibiotics: The patient does not require  prophylactic antibiotics. Prior Anticoagulants: The                         patient has taken no previous anticoagulant or                         antiplatelet agents. ASA Grade Assessment:  IV - A                         patient with severe systemic disease that is a                         constant threat to life. After reviewing the risks and                         benefits, the patient was deemed in satisfactory                         condition to undergo the procedure. The anesthesia                         plan was to use monitored anesthesia care (MAC).                         Immediately prior to administration of medications,                         the patient was re-assessed for adequacy to receive                         sedatives. The heart rate, respiratory rate, oxygen                         saturations, blood pressure, adequacy of pulmonary                         ventilation, and response to care were monitored                         throughout the procedure. The physical status of the                         patient was re-assessed after the procedure.                        After obtaining informed consent, the endoscope was                         passed under direct vision. Throughout the procedure,                         the patient's blood pressure, pulse, and oxygen                         saturations were monitored continuously. The Endoscope                         was introduced through the mouth, and advanced to the  second part of duodenum. The upper GI endoscopy was                         accomplished without difficulty. The patient tolerated                         the procedure fairly well. Findings:      The duodenal bulb and second portion of the duodenum were normal.      Mild, diffuse portal hypertensive gastropathy was found in the entire       examined stomach.      LA Grade A (one or more mucosal breaks less than 5 mm, not extending       between tops of 2 mucosal folds) esophagitis with no bleeding was found       in the middle third of the esophagus.      A small hiatal hernia was present. Impression:             - Normal duodenal bulb and second portion of the                         duodenum.                        - Portal hypertensive gastropathy.                        - LA Grade A reflux, acute and erosive esophagitis                         with no bleeding.                        - Small hiatal hernia.                        - No specimens collected. Recommendation:        - Return patient to hospital ward for ongoing care.                        - Low sodium diet today.                        - Continue present medications.                        - Stop octreotide drip                        - PPI BID Procedure Code(s):     --- Professional ---                        [phone], Esophagogastroduodenoscopy, flexible,                         transoral; diagnostic, including collection of                         specimen(s) by brushing or washing, when performed                         (separate  procedure) Diagnosis Code(s):     --- Professional ---                        K76.6, Portal hypertension                        K31.89, Other diseases of stomach and duodenum                        K21.00                        K20.80                        K44.9, Diaphragmatic hernia without obstruction or                         gangrene                        K92.0, Hematemesis CPT copyright 2019 [employer]. All rights reserved. The codes documented in this report are preliminary and upon coder review may  be revised to meet current compliance requirements. Dr. Ulyess Mort Lin Landsman MD, MD 11/02/2019 1:13:59 PM This report has been signed electronically. Number of Addenda: 0 Note Initiated On: 11/02/2019 12:55 PM Estimated Blood Loss:  Estimated blood loss: none.      [facility]

## 2019-11-03 ENCOUNTER — Encounter: Payer: Self-pay | Admitting: Gastroenterology

## 2019-11-03 DIAGNOSIS — K7200 Acute and subacute hepatic failure without coma: Secondary | ICD-10-CM

## 2019-11-03 DIAGNOSIS — K7030 Alcoholic cirrhosis of liver without ascites: Secondary | ICD-10-CM

## 2019-11-03 LAB — CBC
HCT: 32.6 % — ABNORMAL LOW (ref 39.0–52.0)
Hemoglobin: 11.1 g/dL — ABNORMAL LOW (ref 13.0–17.0)
MCH: 34.9 pg — ABNORMAL HIGH (ref 26.0–34.0)
MCHC: 34 g/dL (ref 30.0–36.0)
MCV: 102.5 fL — ABNORMAL HIGH (ref 80.0–100.0)
Platelets: 83 10*3/uL — ABNORMAL LOW (ref 150–400)
RBC: 3.18 MIL/uL — ABNORMAL LOW (ref 4.22–5.81)
RDW: 15.9 % — ABNORMAL HIGH (ref 11.5–15.5)
WBC: 10.8 10*3/uL — ABNORMAL HIGH (ref 4.0–10.5)
nRBC: 0 % (ref 0.0–0.2)

## 2019-11-03 LAB — COMPREHENSIVE METABOLIC PANEL
ALT: 23 U/L (ref 0–44)
AST: 93 U/L — ABNORMAL HIGH (ref 15–41)
Albumin: 1.7 g/dL — ABNORMAL LOW (ref 3.5–5.0)
Alkaline Phosphatase: 180 U/L — ABNORMAL HIGH (ref 38–126)
Anion gap: 7 (ref 5–15)
BUN: <5 mg/dL — ABNORMAL LOW (ref 6–20)
CO2: 25 mmol/L (ref 22–32)
Calcium: 7.7 mg/dL — ABNORMAL LOW (ref 8.9–10.3)
Chloride: 106 mmol/L (ref 98–111)
Creatinine, Ser: <0.3 mg/dL — ABNORMAL LOW (ref 0.61–1.24)
Glucose, Bld: 134 mg/dL — ABNORMAL HIGH (ref 70–99)
Potassium: 2.7 mmol/L — CL (ref 3.5–5.1)
Sodium: 138 mmol/L (ref 135–145)
Total Bilirubin: 16.8 mg/dL — ABNORMAL HIGH (ref 0.3–1.2)
Total Protein: 5.9 g/dL — ABNORMAL LOW (ref 6.5–8.1)

## 2019-11-03 LAB — AMMONIA: Ammonia: 109 umol/L — ABNORMAL HIGH (ref 9–35)

## 2019-11-03 LAB — PROTIME-INR
INR: 2 — ABNORMAL HIGH (ref 0.8–1.2)
Prothrombin Time: 21.9 seconds — ABNORMAL HIGH (ref 11.4–15.2)

## 2019-11-03 LAB — VITAMIN B12: Vitamin B-12: 3116 pg/mL — ABNORMAL HIGH (ref 180–914)

## 2019-11-03 MED ORDER — POTASSIUM CHLORIDE CRYS ER 20 MEQ PO TBCR
40.0000 meq | EXTENDED_RELEASE_TABLET | Freq: Two times a day (BID) | ORAL | Status: DC
  Administered 2019-11-03 (×2): 40 meq via ORAL
  Filled 2019-11-03 (×2): qty 2

## 2019-11-03 MED ORDER — PANTOPRAZOLE SODIUM 40 MG PO TBEC
40.0000 mg | DELAYED_RELEASE_TABLET | Freq: Two times a day (BID) | ORAL | Status: DC
  Administered 2019-11-03 – 2019-11-09 (×12): 40 mg via ORAL
  Filled 2019-11-03 (×13): qty 1

## 2019-11-03 MED ORDER — POTASSIUM CHLORIDE 10 MEQ/100ML IV SOLN
10.0000 meq | INTRAVENOUS | Status: AC
  Administered 2019-11-03 (×4): 10 meq via INTRAVENOUS
  Filled 2019-11-03 (×4): qty 100

## 2019-11-03 MED ORDER — FOLIC ACID 1 MG PO TABS
2.0000 mg | ORAL_TABLET | Freq: Every day | ORAL | Status: AC
  Administered 2019-11-05 – 2019-11-06 (×2): 2 mg via ORAL
  Filled 2019-11-03 (×2): qty 2

## 2019-11-03 MED ORDER — GABAPENTIN 600 MG PO TABS
300.0000 mg | ORAL_TABLET | Freq: Every day | ORAL | Status: DC
  Administered 2019-11-06 – 2019-11-09 (×3): 300 mg via ORAL
  Filled 2019-11-03 (×4): qty 1

## 2019-11-03 NOTE — Progress Notes (Signed)
Brett Repress, MD [address]  [address]  [place], [place] [address]  Main: [phone]  Fax: [phone] Pager: [phone]   Subjective: Patient is lying in bed comfortable, sleeping.  He denies any abdominal pain, nausea or vomiting.  He reports having bowel movements.  He underwent EGD yesterday which was fairly unremarkable.   Objective: Vital signs in last 24 hours: Vitals:   11/02/19 1915 11/02/19 2300 11/03/19 0542 11/03/19 0747  BP: 110/77 118/80 106/75 104/69  Pulse: 78 78 72 62  Resp:    19  Temp: 98.6 F (37 C) 98.3 F (36.8 C) 98.3 F (36.8 C) 97.6 F (36.4 C)  TempSrc: Oral Oral Oral Oral  SpO2: 100%  99% 98%  Weight:   76.9 kg   Height:       Weight change: -3.115 kg  Intake/Output Summary (Last 24 hours) at 11/03/2019 1445 Last data filed at 11/03/2019 1400 Gross per 24 hour  Intake 2300.89 ml  Output 2400 ml  Net -99.11 ml     Exam: Heart:: Regular rate and rhythm, S1S2 present or without murmur or extra heart sounds Lungs: normal and clear to auscultation Abdomen: soft, nontender, normal bowel sounds   Lab Results: CBC Latest Ref Rng & Units 11/03/2019 11/02/2019 11/02/2019  WBC 4.0 - 10.5 K/uL 10.8(H) 10.1 11.6(H)  Hemoglobin 13.0 - 17.0 g/dL 11.1(L) 11.5(L) 10.9(L)  Hematocrit 39 - 52 % 32.6(L) 33.2(L) 30.8(L)  Platelets 150 - 400 K/uL 83(L) 84(L) 87(L)   CMP Latest Ref Rng & Units 11/03/2019 11/02/2019 11/01/2019  Glucose 70 - 99 mg/dL 875(I) 95 433(I)  BUN 6 - 20 mg/dL <9(J) <1(O) <8(C)  Creatinine 0.61 - 1.24 mg/dL <1.66(A) <6.30(Z) 6.01(U)  Sodium 135 - 145 mmol/L 138 140 136  Potassium 3.5 - 5.1 mmol/L 2.7(LL) 3.2(L) 2.5(LL)  Chloride 98 - 111 mmol/L 106 109 100  CO2 22 - 32 mmol/L 25 25 25   Calcium 8.9 - 10.3 mg/dL 7.7(L) 7.3(L) 7.9(L)  Total Protein 6.5 - 8.1 g/dL 5.9(L) 6.7 7.6  Total Bilirubin 0.3 - 1.2 mg/dL 16.8(H) 19.6(HH) 21.1(HH)  Alkaline Phos 38 - 126 U/L 180(H) 219(H) 256(H)  AST 15 - 41 U/L 93(H) 147(H)  164(H)  ALT 0 - 44 U/L 23 28 32    Micro Results: Recent Results (from the past 240 hour(s))  SARS Coronavirus 2 by RT PCR (hospital order, performed in [facility] hospital lab) Nasopharyngeal Nasopharyngeal Swab     Status: None   Collection Time: 11/01/19 11:45 AM   Specimen: Nasopharyngeal Swab  Result Value Ref Range Status   SARS Coronavirus 2 NEGATIVE NEGATIVE Final    Comment: (NOTE) SARS-CoV-2 target nucleic acids are NOT DETECTED.  The SARS-CoV-2 RNA is generally detectable in upper and lower respiratory specimens during the acute phase of infection. The lowest concentration of SARS-CoV-2 viral copies this assay can detect is 250 copies / mL. A negative result does not preclude SARS-CoV-2 infection and should not be used as the sole basis for treatment or other patient management decisions.  A negative result may occur with improper specimen collection / handling, submission of specimen other than nasopharyngeal swab, presence of viral mutation(s) within the areas targeted by this assay, and inadequate number of viral copies (<250 copies / mL). A negative result must be combined with clinical observations, patient history, and epidemiological information.  Fact Sheet for Patients:   11/03/19  Fact Sheet for Healthcare Providers: BoilerBrush.com.cy  This test is not yet approved or  cleared by the [place] and has been authorized for detection and/or diagnosis of SARS-CoV-2 by FDA under an Emergency Use Authorization (EUA).  This EUA will remain in effect (meaning this test can be used) for the duration of the COVID-19 declaration under Section 564(b)(1) of the Act, 21 U.S.C. section 360bbb-3(b)(1), unless the authorization is terminated or revoked sooner.  Performed at [facility], [address] [place], [place] [address]   CULTURE, BLOOD (ROUTINE X 2) w Reflex to ID Panel     Status:  None (Preliminary result)   Collection Time: 11/01/19  5:02 PM   Specimen: BLOOD  Result Value Ref Range Status   Specimen Description BLOOD BLOOD RIGHT HAND  Final   Special Requests   Final    BOTTLES DRAWN AEROBIC ONLY Blood Culture adequate volume   Culture   Final    NO GROWTH 2 DAYS Performed at [facility], [address] [place], [place] [address]    Report Status PENDING  Incomplete  CULTURE, BLOOD (ROUTINE X 2) w Reflex to ID Panel     Status: None (Preliminary result)   Collection Time: 11/01/19 10:21 PM   Specimen: Right Antecubital; Blood  Result Value Ref Range Status   Specimen Description RIGHT ANTECUBITAL  Final   Special Requests   Final    BOTTLES DRAWN AEROBIC AND ANAEROBIC Blood Culture adequate volume   Culture   Final    NO GROWTH 2 DAYS Performed at [facility], [address], [place], [place] [address]    Report Status PENDING  Incomplete   Studies/Results: No results found. Medications:  I have reviewed the patient's current medications. Prior to Admission:  Medications Prior to Admission  Medication Sig Dispense Refill Last Dose  . folic acid (FOLVITE) 1 MG tablet Take 1 tablet (1 mg total) by mouth daily. (Patient not taking: Reported on 11/01/2019) 30 tablet 0 Not Taking at Unknown time  . thiamine 100 MG tablet Take 1 tablet (100 mg total) by mouth daily. (Patient not taking: Reported on 11/01/2019) 30 tablet 0 Not Taking at Unknown time   Scheduled: . feeding supplement (ENSURE ENLIVE)  237 mL Oral TID BM  . [START ON 11/04/2019] folic acid  2 mg Oral Daily  . lactulose  30 g Oral TID  . LORazepam  0-4 mg Intravenous Q12H   Or  . LORazepam  0-4 mg Oral Q12H  . methylPREDNISolone (SOLU-MEDROL) injection  32 mg Intravenous Q24H  . multivitamin with minerals  1 tablet Oral Daily  . pantoprazole  40 mg Oral BID  . phytonadione  5 mg Oral Daily  . potassium chloride  40 mEq Oral BID  . rifaximin  550 mg Oral BID    Continuous: . sodium chloride Stopped (11/02/19 0712)  . levETIRAcetam 1,000 mg (11/03/19 0505)  . thiamine injection 500 mg (11/03/19 1314)   UVO:ZDGUYQ chloride, LORazepam, ondansetron **OR** ondansetron (ZOFRAN) IV, sodium chloride flush Anti-infectives (From admission, onward)   Start     Dose/Rate Route Frequency Ordered Stop   11/02/19 1100  cefTRIAXone (ROCEPHIN) 1 g in sodium chloride 0.9 % 100 mL IVPB  Status:  Discontinued        1 g 200 mL/hr over 30 Minutes Intravenous Every 24 hours 11/01/19 1523 11/02/19 1320   11/01/19 2200  rifaximin (XIFAXAN) tablet 550 mg     Discontinue     550 mg Oral 2 times daily 11/01/19 1801     11/01/19 1415  rifaximin (XIFAXAN) tablet 200 mg        200 mg Oral  Once 11/01/19 1413 11/01/19 1552   11/01/19 1130  cefTRIAXone (ROCEPHIN) 1 g in sodium chloride 0.9 % 100 mL IVPB        1 g 200 mL/hr over 30 Minutes Intravenous  Once 11/01/19 1124 11/01/19 1336     Scheduled Meds: . feeding supplement (ENSURE ENLIVE)  237 mL Oral TID BM  . [START ON 11/04/2019] folic acid  2 mg Oral Daily  . lactulose  30 g Oral TID  . LORazepam  0-4 mg Intravenous Q12H   Or  . LORazepam  0-4 mg Oral Q12H  . methylPREDNISolone (SOLU-MEDROL) injection  32 mg Intravenous Q24H  . multivitamin with minerals  1 tablet Oral Daily  . pantoprazole  40 mg Oral BID  . phytonadione  5 mg Oral Daily  . potassium chloride  40 mEq Oral BID  . rifaximin  550 mg Oral BID   Continuous Infusions: . sodium chloride Stopped (11/02/19 0712)  . levETIRAcetam 1,000 mg (11/03/19 0505)  . thiamine injection 500 mg (11/03/19 1314)   PRN Meds:.sodium chloride, LORazepam, ondansetron **OR** ondansetron (ZOFRAN) IV, sodium chloride flush   Assessment: Principal Problem:   Hematemesis Active Problems:   Thrombocytopenia (HCC)   Hypokalemia   Acute hepatic encephalopathy   Leukocytosis   Abnormal LFTs   Seizure (HCC)   Alcohol abuse  Irvine Glorioso is a 39 y.o. male with  alcohol abuse, several admissions with alcohol intoxication presented with alcoholic hepatitis with acute alcoholic liver failure and hematemesis  Plan: Hematemesis: Self-limited secondary to erosive esophagitis S/p EGD on 7/20, no evidence of peptic ulcer disease or esophageal varices Continue Protonix 40 mg p.o. twice daily before meals long-term Discontinue IV fluids  Acute liver failure, alcoholic cirrhosis with alcoholic hepatitis LFTs are improving, T bili downtrending 21.1>>19.6>>16.8 Methylprednisolone has been started on 7/20 for alcoholic hepatitis due to high maddery discriminant function, calculate Lille score on day 5 to assess response to steroids No evidence of infection, blood cultures negative to date, urine negative Maintain low-sodium diet Discontinue IV fluids Monitor coags daily, administer vitamin K 5 mg daily for 3 days Continue multivitamin plus thiamine and folate.  Patient has severe folic acid deficiency, increase folate to 2 mg daily for 3 days then 1 mg daily Continue lactulose 2-3 times daily and rifaximin for hepatic encephalopathy  Alcohol abuse Recommend gabapentin 100 mg 3 times daily   LOS: 2 days   Gerlean Cid 11/03/2019, 2:45 PM

## 2019-11-03 NOTE — Progress Notes (Signed)
PROGRESS NOTE    Brett Washington  JJH:[id number] DOB: [birth year] DOA: 11/01/2019 PCP: [facility]    Brief Narrative:  39 year old gentleman with alcohol abuse, frequent hospitalization related to alcoholism presented to the emergency room with 2 episodes of seizures at home and confusion, hematemesis. In the emergency room, mildly hypotensive and tachycardic responded to IV fluid resuscitation.  Hemoglobin 13. ALP 256/AST 164/ALT 32 and bilirubin of 21.  Hypokalemic, elevated lactic acid and leukocytosis. Patient was started on IV fluids, IV Protonix and octreotide and ceftriaxone, Keppra, CIWA protocol and admitted to the hospital.  He has been drinking about 6-8 beers per day continuous for last 5 years, unable to drink for the last 3 days due to nausea and vomiting resulting in withdrawals.   Assessment & Plan:   Principal Problem:   Hematemesis Active Problems:   Thrombocytopenia (HCC)   Hypokalemia   Acute hepatic encephalopathy   Leukocytosis   Abnormal LFTs   Seizure (HCC)   Alcohol abuse  Hematemesis/upper GI bleeding: Underwent EGD that showed erosive gastritis, no active bleeding.  Hemoglobin has remained stable. Discontinue octreotide, discontinue Protonix infusion.  We will keep on Protonix 40 mg twice daily.  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  Lactic acidosis.  Macrocytic anemia. Presented with [facility] discriminant function of 35, started on IV steroids. No evidence of infection. Currently remains on Methylprednisone, with plan for prolonged therapy and gradual taper Multivitamins with thiamine and folic acid Vitamin K x3 Appreciate GI recommendations  Alcoholism/alcohol withdrawal seizure: All-time seizure precautions.  Fall precautions.  Delirium precautions. Remains on high-dose thiamine and folic acid replacement. Started on Keppra, will continue until clinical improvement.   Will change to oral once he has better clinical improvement. Initially on Rocephin, no evidence of SBP that is discontinued. Remains on rifaximin, lactulose with aim to keep to have 2-3 bowel movements.  Hypokalemia: Severe and persistent.  Replace IV and oral.  Check magnesium and phosphorus in the morning.     DVT prophylaxis: SCDs Start: 11/01/19 1531   Code Status: Full code Family Communication: None at bedside Disposition Plan: Status is: Inpatient  Remains inpatient appropriate because:IV treatments appropriate due to intensity of illness or inability to take PO and Inpatient level of care appropriate due to severity of illness   Dispo: The patient is from: Home              Anticipated d/c is to: Home              Anticipated d/c date is: > 3 days              Patient currently is not medically stable to d/c.         Consultants:   Gastroenterology  Procedures:   None  Antimicrobials:  Anti-infectives (From admission, onward)   Start     Dose/Rate Route Frequency Ordered Stop   11/02/19 1100  cefTRIAXone (ROCEPHIN) 1 g in sodium chloride 0.9 % 100 mL IVPB  Status:  Discontinued        1 g 200 mL/hr over 30 Minutes Intravenous Every 24 hours 11/01/19 1523 11/02/19 1320   11/01/19 2200  rifaximin (XIFAXAN) tablet 550 mg     Discontinue     550 mg Oral 2 times daily 11/01/19 1801     11/01/19 1415  rifaximin (XIFAXAN) tablet 200 mg        200 mg Oral  Once 11/01/19 1413 11/01/19 1552  11/01/19 1130  cefTRIAXone (ROCEPHIN) 1 g in sodium chloride 0.9 % 100 mL IVPB        1 g 200 mL/hr over 30 Minutes Intravenous  Once 11/01/19 1124 11/01/19 1336         Subjective: Patient seen and examined.  He was without any complaints.  Denies any nausea vomiting.  He said he went to bathroom 2 times last night.  Denies any abdominal pain or bloating.  Objective: Vitals:   11/02/19 1915 11/02/19 2300 11/03/19 0542 11/03/19 0747  BP: 110/77 118/80 106/75 104/69    Pulse: 78 78 72 62  Resp:    19  Temp: 98.6 F (37 C) 98.3 F (36.8 C) 98.3 F (36.8 C) 97.6 F (36.4 C)  TempSrc: Oral Oral Oral Oral  SpO2: 100%  99% 98%  Weight:   76.9 kg   Height:        Intake/Output Summary (Last 24 hours) at 11/03/2019 1450 Last data filed at 11/03/2019 1400 Gross per 24 hour  Intake 2300.89 ml  Output 2400 ml  Net -99.11 ml   Filed Weights   11/01/19 1011 11/02/19 0609 11/03/19 0542  Weight: 80 kg 78.1 kg 76.9 kg    Examination:  General exam: Appears calm and comfortable  Icteric, sleepy No tremors Respiratory system: Clear to auscultation. Respiratory effort normal. No added sounds  Cardiovascular system: S1 & S2 heard, RRR.  Gastrointestinal system: Abdomen is nondistended, soft and nontender. No organomegaly or masses felt. Normal bowel sounds heard. Central nervous system: Alert and oriented. No focal neurological deficits. Extremities: Symmetric 5 x 5 power. Skin: No rashes, lesions or ulcers Psychiatry: Judgement and insight appear normal. Mood & affect flat and withdrawn    Data Reviewed: I have personally reviewed following labs and imaging studies  CBC: Recent Labs  Lab 11/01/19 2208 11/02/19 0714 11/02/19 1534 11/02/19 2306 11/03/19 0712  WBC 10.6* 11.3* 11.6* 10.1 10.8*  HGB 12.6* 10.9* 10.9* 11.5* 11.1*  HCT 36.8* 31.1* 30.8* 33.2* 32.6*  MCV 102.5* 102.0* 101.0* 101.8* 102.5*  PLT 95* 85* 87* 84* 83*   Basic Metabolic Panel: Recent Labs  Lab 11/01/19 1024 11/01/19 1601 11/01/19 1702 11/02/19 0111 11/03/19 0712  NA 136  --   --  140 138  K 2.5*  --   --  3.2* 2.7*  CL 100  --   --  109 106  CO2 25  --   --  25 25  GLUCOSE 122*  --   --  95 134*  BUN <5*  --   --  <5* <5*  CREATININE 0.37*  --   --  <0.30* <0.30*  CALCIUM 7.9*  --   --  7.3* 7.7*  MG  --   --  2.0 2.0  --   PHOS  --  UNABLE TO REPORT DUE TO ICTERUS. QSD  --  UNABLE TO REPORT DUE TO ICTERUS / JAG  --    GFR: CrCl cannot be calculated  (This lab value cannot be used to calculate CrCl because it is not a number: <0.30). Liver Function Tests: Recent Labs  Lab 11/01/19 1024 11/02/19 0111 11/03/19 0712  AST 164* 147* 93*  ALT 32 28 23  ALKPHOS 256* 219* 180*  BILITOT 21.1* 19.6* 16.8*  PROT 7.6 6.7 5.9*  ALBUMIN 2.2* 2.0* 1.7*   No results for input(s): LIPASE, AMYLASE in the last 168 hours. Recent Labs  Lab 11/01/19 1145 11/03/19 0712  AMMONIA 117* 109*   Coagulation  Profile: Recent Labs  Lab 11/01/19 1024  INR 1.6*   Cardiac Enzymes: No results for input(s): CKTOTAL, CKMB, CKMBINDEX, TROPONINI in the last 168 hours. BNP (last 3 results) No results for input(s): PROBNP in the last 8760 hours. HbA1C: No results for input(s): HGBA1C in the last 72 hours. CBG: No results for input(s): GLUCAP in the last 168 hours. Lipid Profile: No results for input(s): CHOL, HDL, LDLCALC, TRIG, CHOLHDL, LDLDIRECT in the last 72 hours. Thyroid Function Tests: No results for input(s): TSH, T4TOTAL, FREET4, T3FREE, THYROIDAB in the last 72 hours. Anemia Panel: Recent Labs    11/01/19 2208  FOLATE 5.9*   Sepsis Labs: Recent Labs  Lab 11/01/19 1601 11/01/19 1702 11/02/19 0714 11/02/19 1104 11/02/19 1553 11/02/19 1831  PROCALCITON 0.25  --   --   --   --   --   LATICACIDVEN  --    < > 2.3* 2.5* 1.8 1.5   < > = values in this interval not displayed.    Recent Results (from the past 240 hour(s))  SARS Coronavirus 2 by RT PCR (hospital order, performed in [facility] hospital lab) Nasopharyngeal Nasopharyngeal Swab     Status: None   Collection Time: 11/01/19 11:45 AM   Specimen: Nasopharyngeal Swab  Result Value Ref Range Status   SARS Coronavirus 2 NEGATIVE NEGATIVE Final    Comment: (NOTE) SARS-CoV-2 target nucleic acids are NOT DETECTED.  The SARS-CoV-2 RNA is generally detectable in upper and lower respiratory specimens during the acute phase of infection. The lowest concentration of SARS-CoV-2 viral  copies this assay can detect is 250 copies / mL. A negative result does not preclude SARS-CoV-2 infection and should not be used as the sole basis for treatment or other patient management decisions.  A negative result may occur with improper specimen collection / handling, submission of specimen other than nasopharyngeal swab, presence of viral mutation(s) within the areas targeted by this assay, and inadequate number of viral copies (<250 copies / mL). A negative result must be combined with clinical observations, patient history, and epidemiological information.  Fact Sheet for Patients:   BoilerBrush.com.cy  Fact Sheet for Healthcare Providers: https://pope.com/  This test is not yet approved or  cleared by the [place] FDA and has been authorized for detection and/or diagnosis of SARS-CoV-2 by FDA under an Emergency Use Authorization (EUA).  This EUA will remain in effect (meaning this test can be used) for the duration of the COVID-19 declaration under Section 564(b)(1) of the Act, 21 U.S.C. section 360bbb-3(b)(1), unless the authorization is terminated or revoked sooner.  Performed at [facility], [address] [place], [place] [address]   CULTURE, BLOOD (ROUTINE X 2) w Reflex to ID Panel     Status: None (Preliminary result)   Collection Time: 11/01/19  5:02 PM   Specimen: BLOOD  Result Value Ref Range Status   Specimen Description BLOOD BLOOD RIGHT HAND  Final   Special Requests   Final    BOTTLES DRAWN AEROBIC ONLY Blood Culture adequate volume   Culture   Final    NO GROWTH 2 DAYS Performed at [facility], [address] [place], [place] [address]    Report Status PENDING  Incomplete  CULTURE, BLOOD (ROUTINE X 2) w Reflex to ID Panel     Status: None (Preliminary result)   Collection Time: 11/01/19 10:21 PM   Specimen: Right Antecubital; Blood  Result Value Ref Range Status   Specimen  Description RIGHT ANTECUBITAL  Final   Special Requests   Final    BOTTLES DRAWN AEROBIC AND ANAEROBIC Blood Culture adequate volume   Culture   Final    NO GROWTH 2 DAYS Performed at [facility], [address] [place], [place] [address]    Report Status PENDING  Incomplete         Radiology Studies: No results found.      Scheduled Meds: . feeding supplement (ENSURE ENLIVE)  237 mL Oral TID BM  . [START ON 11/04/2019] folic acid  2 mg Oral Daily  . gabapentin  300 mg Oral Daily  . lactulose  30 g Oral TID  . LORazepam  0-4 mg Intravenous Q12H   Or  . LORazepam  0-4 mg Oral Q12H  . methylPREDNISolone (SOLU-MEDROL) injection  32 mg Intravenous Q24H  . multivitamin with minerals  1 tablet Oral Daily  . pantoprazole  40 mg Oral BID  . phytonadione  5 mg Oral Daily  . potassium chloride  40 mEq Oral BID  . rifaximin  550 mg Oral BID   Continuous Infusions: . sodium chloride Stopped (11/02/19 0712)  . levETIRAcetam 1,000 mg (11/03/19 0505)  . thiamine injection 500 mg (11/03/19 1314)     LOS: 2 days    Time spent: 30 minutes    Dorcas CarrowKuber Key Cen, MD [facility] Pager [phone]

## 2019-11-03 NOTE — Plan of Care (Signed)
  Problem: Education: Goal: Knowledge of General Education information will improve Description Including pain rating scale, medication(s)/side effects and non-pharmacologic comfort measures Outcome: Progressing   

## 2019-11-03 NOTE — Progress Notes (Signed)
Pt pulled out 2 IV's tonight.  20g insert to right forearm with rapid blood return.  Flushed w/o resist.  IV wrapped in coban.  Pt tolerated well.

## 2019-11-04 LAB — CBC WITH DIFFERENTIAL/PLATELET
Abs Immature Granulocytes: 0.11 10*3/uL — ABNORMAL HIGH (ref 0.00–0.07)
Basophils Absolute: 0.1 10*3/uL (ref 0.0–0.1)
Basophils Relative: 1 %
Eosinophils Absolute: 0.2 10*3/uL (ref 0.0–0.5)
Eosinophils Relative: 1 %
HCT: 33.9 % — ABNORMAL LOW (ref 39.0–52.0)
Hemoglobin: 11.6 g/dL — ABNORMAL LOW (ref 13.0–17.0)
Immature Granulocytes: 1 %
Lymphocytes Relative: 10 %
Lymphs Abs: 1.3 10*3/uL (ref 0.7–4.0)
MCH: 35.4 pg — ABNORMAL HIGH (ref 26.0–34.0)
MCHC: 34.2 g/dL (ref 30.0–36.0)
MCV: 103.4 fL — ABNORMAL HIGH (ref 80.0–100.0)
Monocytes Absolute: 0.5 10*3/uL (ref 0.1–1.0)
Monocytes Relative: 4 %
Neutro Abs: 10.7 10*3/uL — ABNORMAL HIGH (ref 1.7–7.7)
Neutrophils Relative %: 83 %
Platelets: 103 10*3/uL — ABNORMAL LOW (ref 150–400)
RBC: 3.28 MIL/uL — ABNORMAL LOW (ref 4.22–5.81)
RDW: 15.9 % — ABNORMAL HIGH (ref 11.5–15.5)
WBC: 12.8 10*3/uL — ABNORMAL HIGH (ref 4.0–10.5)
nRBC: 0 % (ref 0.0–0.2)

## 2019-11-04 LAB — COMPREHENSIVE METABOLIC PANEL
ALT: 30 U/L (ref 0–44)
AST: 105 U/L — ABNORMAL HIGH (ref 15–41)
Albumin: 1.9 g/dL — ABNORMAL LOW (ref 3.5–5.0)
Alkaline Phosphatase: 183 U/L — ABNORMAL HIGH (ref 38–126)
Anion gap: 3 — ABNORMAL LOW (ref 5–15)
BUN: <5 mg/dL — ABNORMAL LOW (ref 6–20)
CO2: 28 mmol/L (ref 22–32)
Calcium: 8 mg/dL — ABNORMAL LOW (ref 8.9–10.3)
Chloride: 108 mmol/L (ref 98–111)
Creatinine, Ser: <0.3 mg/dL — ABNORMAL LOW (ref 0.61–1.24)
Glucose, Bld: 94 mg/dL (ref 70–99)
Potassium: 2.7 mmol/L — CL (ref 3.5–5.1)
Sodium: 139 mmol/L (ref 135–145)
Total Bilirubin: 19.1 mg/dL (ref 0.3–1.2)
Total Protein: 6.5 g/dL (ref 6.5–8.1)

## 2019-11-04 LAB — MAGNESIUM: Magnesium: 1.6 mg/dL — ABNORMAL LOW (ref 1.7–2.4)

## 2019-11-04 LAB — PHOSPHORUS: Phosphorus: UNDETERMINED mg/dL (ref 2.5–4.6)

## 2019-11-04 LAB — PROTIME-INR
INR: 2 — ABNORMAL HIGH (ref 0.8–1.2)
Prothrombin Time: 21.8 seconds — ABNORMAL HIGH (ref 11.4–15.2)

## 2019-11-04 MED ORDER — POTASSIUM CHLORIDE 10 MEQ/100ML IV SOLN
10.0000 meq | INTRAVENOUS | Status: AC
  Administered 2019-11-04 (×6): 10 meq via INTRAVENOUS
  Filled 2019-11-04 (×6): qty 100

## 2019-11-04 MED ORDER — LORAZEPAM 2 MG PO TABS
0.0000 mg | ORAL_TABLET | Freq: Four times a day (QID) | ORAL | Status: DC
  Administered 2019-11-04: 4 mg via ORAL
  Administered 2019-11-05: 2 mg via ORAL
  Administered 2019-11-05: 4 mg via ORAL
  Administered 2019-11-06 – 2019-11-08 (×5): 2 mg via ORAL
  Filled 2019-11-04 (×3): qty 1
  Filled 2019-11-04: qty 2
  Filled 2019-11-04 (×2): qty 1
  Filled 2019-11-04: qty 2
  Filled 2019-11-04: qty 1

## 2019-11-04 MED ORDER — POTASSIUM CHLORIDE CRYS ER 20 MEQ PO TBCR
20.0000 meq | EXTENDED_RELEASE_TABLET | Freq: Three times a day (TID) | ORAL | Status: AC
  Administered 2019-11-04 – 2019-11-05 (×4): 20 meq via ORAL
  Filled 2019-11-04 (×4): qty 1

## 2019-11-04 MED ORDER — LORAZEPAM 2 MG/ML IJ SOLN
2.0000 mg | Freq: Once | INTRAMUSCULAR | Status: AC
  Administered 2019-11-05: 2 mg via INTRAVENOUS
  Filled 2019-11-04: qty 1

## 2019-11-04 MED ORDER — LORAZEPAM 2 MG PO TABS: 0.0000 mg | ORAL_TABLET | Freq: Four times a day (QID) | ORAL | Status: DC

## 2019-11-04 MED ORDER — LORAZEPAM 2 MG/ML IJ SOLN: 0.0000 mg | Freq: Four times a day (QID) | INTRAMUSCULAR | Status: DC

## 2019-11-04 MED ORDER — LORAZEPAM 2 MG/ML IJ SOLN
0.0000 mg | Freq: Four times a day (QID) | INTRAMUSCULAR | Status: DC
  Administered 2019-11-04: 2 mg via INTRAVENOUS
  Administered 2019-11-04 – 2019-11-05 (×4): 4 mg via INTRAVENOUS
  Administered 2019-11-06: 1 mg via INTRAVENOUS
  Filled 2019-11-04 (×2): qty 1
  Filled 2019-11-04 (×2): qty 2
  Filled 2019-11-04: qty 1
  Filled 2019-11-04: qty 2
  Filled 2019-11-04: qty 1

## 2019-11-04 MED ORDER — LACTULOSE 10 GM/15ML PO SOLN
20.0000 g | Freq: Two times a day (BID) | ORAL | Status: DC | PRN
  Administered 2019-11-06: 20 g via ORAL
  Filled 2019-11-04: qty 30

## 2019-11-04 MED ORDER — LORAZEPAM 2 MG/ML IJ SOLN
2.0000 mg | Freq: Once | INTRAMUSCULAR | Status: AC
  Administered 2019-11-04: 2 mg via INTRAVENOUS

## 2019-11-04 MED ORDER — LACTULOSE 10 GM/15ML PO SOLN: 30.0000 g | Freq: Two times a day (BID) | ORAL | Status: DC | PRN

## 2019-11-04 MED ORDER — MAGNESIUM SULFATE 2 GM/50ML IV SOLN
2.0000 g | Freq: Once | INTRAVENOUS | Status: AC
  Administered 2019-11-04: 2 g via INTRAVENOUS
  Filled 2019-11-04: qty 50

## 2019-11-04 MED ORDER — VITAMIN K1 10 MG/ML IJ SOLN
10.0000 mg | Freq: Every day | INTRAVENOUS | Status: AC
  Administered 2019-11-04 – 2019-11-06 (×3): 10 mg via INTRAVENOUS
  Filled 2019-11-04 (×3): qty 1

## 2019-11-04 NOTE — Progress Notes (Signed)
PROGRESS NOTE    Brett Washington  QJJ:[id number] DOB: Feb 01, 1981 DOA: 11/01/2019 PCP: [facility]    Brief Narrative:  39 year old gentleman with alcohol abuse, frequent hospitalization related to alcoholism presented to the emergency room with 2 episodes of seizures at home and confusion, hematemesis. In the emergency room, mildly hypotensive and tachycardic responded to IV fluid resuscitation.  Hemoglobin 13. ALP 256/AST 164/ALT 32 and bilirubin of 21.  Hypokalemic, elevated lactic acid and leukocytosis. Patient was started on IV fluids, IV Protonix and octreotide and ceftriaxone, Keppra, CIWA protocol and admitted to the hospital.  He has been drinking about 6-8 beers per day continuous for last 5 years, unable to drink for the last 3 days due to nausea and vomiting resulting in withdrawals. 7/22, remains with alcohol withdrawal, impulsive and agitated as well as advance liver failure.   Assessment & Plan:   Principal Problem:   Hematemesis Active Problems:   Thrombocytopenia (HCC)   Hypokalemia   Acute hepatic encephalopathy   Leukocytosis   Abnormal LFTs   Seizure (HCC)   Alcohol abuse  Hematemesis/upper GI bleeding: Underwent EGD that showed erosive gastritis, no active bleeding.  Hemoglobin has remained stable.  On oral Protonix today.  No evidence of bleeding.  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  Lactic acidosis.  Macrocytic anemia. Presented with [facility] discriminant function of 35, started on steroids. No evidence of infection. Currently remains on Methylprednisone, with plan for prolonged therapy and gradual taper Multivitamins with thiamine and folic acid Vitamin K x3 Appreciate GI recommendations Poor prognosis.  Alcoholism/alcohol withdrawal seizure: All-time seizure precautions.  Fall precautions.  Delirium precautions. Remains on high-dose thiamine and folic acid  replacement. Started on Keppra, will continue until clinical improvement.  Will change to oral once he has better clinical improvement. Initially on Rocephin, no evidence of SBP that is discontinued. Remains on rifaximin, lactulose with aim to keep to have 2-3 bowel movements. Patient with florid alcohol withdrawal today.  Use CIWA scale with Ativan.  Hypokalemia: Severe and persistent.  Replace IV and oral.  Replace magnesium.  Unable to check phosphorus.     DVT prophylaxis: SCDs Start: 11/01/19 1531   Code Status: Full code Family Communication: Wife on the phone. Disposition Plan: Status is: Inpatient  Remains inpatient appropriate because:IV treatments appropriate due to intensity of illness or inability to take PO and Inpatient level of care appropriate due to severity of illness   Dispo: The patient is from: Home              Anticipated d/c is to: Home              Anticipated d/c date is: > 3 days              Patient currently is not medically stable to d/c.         Consultants:   Gastroenterology  Procedures:   None  Antimicrobials:  Anti-infectives (From admission, onward)   Start     Dose/Rate Route Frequency Ordered Stop   11/02/19 1100  cefTRIAXone (ROCEPHIN) 1 g in sodium chloride 0.9 % 100 mL IVPB  Status:  Discontinued        1 g 200 mL/hr over 30 Minutes Intravenous Every 24 hours 11/01/19 1523 11/02/19 1320   11/01/19 2200  rifaximin (XIFAXAN) tablet 550 mg     Discontinue     550 mg Oral 2 times daily 11/01/19 1801 11/15/19 2159   11/01/19  1415  rifaximin (XIFAXAN) tablet 200 mg        200 mg Oral  Once 11/01/19 1413 11/01/19 1552   11/01/19 1130  cefTRIAXone (ROCEPHIN) 1 g in sodium chloride 0.9 % 100 mL IVPB        1 g 200 mL/hr over 30 Minutes Intravenous  Once 11/01/19 1124 11/01/19 1336         Subjective: Patient seen and examined.  Overnight he was agitated and impulsive.  Pulled out IV lines.  On my interview he was slightly  impulsive but able to be counseled and calm.  I asked him what is wrong with him "my liver is bad due to drinking" sitter at the bedside. Reported more than 7 episodes of loose stool overnight. I called patient's wife Ms. Byrd Hesselbachmaria and explained her about critical condition and she is aware.  Objective: Vitals:   11/04/19 0307 11/04/19 0402 11/04/19 0557 11/04/19 0737  BP: 109/75 113/82 100/77 109/75  Pulse: 80 81 86 84  Resp: 20 18 18 20   Temp: (!) 97.5 F (36.4 C) 98 F (36.7 C)  98 F (36.7 C)  TempSrc: Oral     SpO2: 100% 100% 100% 100%  Weight: 77.1 kg     Height:        Intake/Output Summary (Last 24 hours) at 11/04/2019 1109 Last data filed at 11/04/2019 1017 Gross per 24 hour  Intake 2780 ml  Output 1552 ml  Net 1228 ml   Filed Weights   11/02/19 0609 11/03/19 0542 11/04/19 0307  Weight: 78.1 kg 76.9 kg 77.1 kg    Examination:  General exam: Appears chronically sick, agitated and impulsive.  Alert oriented x2.  Icteric. Respiratory system: Clear to auscultation. Respiratory effort normal. No added sounds  Cardiovascular system: S1 & S2 heard, RRR.  Gastrointestinal system: Abdomen is nondistended, soft and nontender. No organomegaly or masses felt. Normal bowel sounds heard. Central nervous system: Alert and oriented x2-3.  No focal neurological deficits. Extremities: Symmetric 5 x 5 power. Skin: No rashes, lesions or ulcers Psychiatry: Judgement and insight appear normal. Mood & affect flat and withdrawn    Data Reviewed: I have personally reviewed following labs and imaging studies  CBC: Recent Labs  Lab 11/02/19 0714 11/02/19 1534 11/02/19 2306 11/03/19 0712 11/04/19 0532  WBC 11.3* 11.6* 10.1 10.8* 12.8*  NEUTROABS  --   --   --   --  10.7*  HGB 10.9* 10.9* 11.5* 11.1* 11.6*  HCT 31.1* 30.8* 33.2* 32.6* 33.9*  MCV 102.0* 101.0* 101.8* 102.5* 103.4*  PLT 85* 87* 84* 83* 103*   Basic Metabolic Panel: Recent Labs  Lab 11/01/19 1024 11/01/19 1601  11/01/19 1702 11/02/19 0111 11/03/19 0712 11/04/19 0532  NA 136  --   --  140 138 139  K 2.5*  --   --  3.2* 2.7* 2.7*  CL 100  --   --  109 106 108  CO2 25  --   --  25 25 28   GLUCOSE 122*  --   --  95 134* 94  BUN <5*  --   --  <5* <5* <5*  CREATININE 0.37*  --   --  <0.30* <0.30* <0.30*  CALCIUM 7.9*  --   --  7.3* 7.7* 8.0*  MG  --   --  2.0 2.0  --  1.6*  PHOS  --  UNABLE TO REPORT DUE TO ICTERUS. QSD  --  UNABLE TO REPORT DUE TO ICTERUS / JAG  --  UNABLE TO REPORT DUE TO ICTERIC INTERFERENCE SDR   GFR: CrCl cannot be calculated (This lab value cannot be used to calculate CrCl because it is not a number: <0.30). Liver Function Tests: Recent Labs  Lab 11/01/19 1024 11/02/19 0111 11/03/19 0712 11/04/19 0532  AST 164* 147* 93* 105*  ALT 32 28 23 30   ALKPHOS 256* 219* 180* 183*  BILITOT 21.1* 19.6* 16.8* 19.1*  PROT 7.6 6.7 5.9* 6.5  ALBUMIN 2.2* 2.0* 1.7* 1.9*   No results for input(s): LIPASE, AMYLASE in the last 168 hours. Recent Labs  Lab 11/01/19 1145 11/03/19 0712  AMMONIA 117* 109*   Coagulation Profile: Recent Labs  Lab 11/01/19 1024 11/03/19 1511 11/04/19 0532  INR 1.6* 2.0* 2.0*   Cardiac Enzymes: No results for input(s): CKTOTAL, CKMB, CKMBINDEX, TROPONINI in the last 168 hours. BNP (last 3 results) No results for input(s): PROBNP in the last 8760 hours. HbA1C: No results for input(s): HGBA1C in the last 72 hours. CBG: No results for input(s): GLUCAP in the last 168 hours. Lipid Profile: No results for input(s): CHOL, HDL, LDLCALC, TRIG, CHOLHDL, LDLDIRECT in the last 72 hours. Thyroid Function Tests: No results for input(s): TSH, T4TOTAL, FREET4, T3FREE, THYROIDAB in the last 72 hours. Anemia Panel: Recent Labs    11/01/19 2208 11/03/19 0712  VITAMINB12  --  3,116*  FOLATE 5.9*  --    Sepsis Labs: Recent Labs  Lab 11/01/19 1601 11/01/19 1702 11/02/19 0714 11/02/19 1104 11/02/19 1553 11/02/19 1831  PROCALCITON 0.25  --   --   --    --   --   LATICACIDVEN  --    < > 2.3* 2.5* 1.8 1.5   < > = values in this interval not displayed.    Recent Results (from the past 240 hour(s))  SARS Coronavirus 2 by RT PCR (hospital order, performed in [facility] hospital lab) Nasopharyngeal Nasopharyngeal Swab     Status: None   Collection Time: 11/01/19 11:45 AM   Specimen: Nasopharyngeal Swab  Result Value Ref Range Status   SARS Coronavirus 2 NEGATIVE NEGATIVE Final    Comment: (NOTE) SARS-CoV-2 target nucleic acids are NOT DETECTED.  The SARS-CoV-2 RNA is generally detectable in upper and lower respiratory specimens during the acute phase of infection. The lowest concentration of SARS-CoV-2 viral copies this assay can detect is 250 copies / mL. A negative result does not preclude SARS-CoV-2 infection and should not be used as the sole basis for treatment or other patient management decisions.  A negative result may occur with improper specimen collection / handling, submission of specimen other than nasopharyngeal swab, presence of viral mutation(s) within the areas targeted by this assay, and inadequate number of viral copies (<250 copies / mL). A negative result must be combined with clinical observations, patient history, and epidemiological information.  Fact Sheet for Patients:   11/03/19  Fact Sheet for Healthcare Providers: BoilerBrush.com.cy  This test is not yet approved or  cleared by the https://pope.com/ FDA and has been authorized for detection and/or diagnosis of SARS-CoV-2 by FDA under an Emergency Use Authorization (EUA).  This EUA will remain in effect (meaning this test can be used) for the duration of the COVID-19 declaration under Section 564(b)(1) of the Act, 21 U.S.C. section 360bbb-3(b)(1), unless the authorization is terminated or revoked sooner.  Performed at [facility], [address] [place], [place]   CULTURE,  BLOOD (ROUTINE X 2) w Reflex to ID Panel  Status: None (Preliminary result)   Collection Time: 11/01/19  5:02 PM   Specimen: BLOOD  Result Value Ref Range Status   Specimen Description BLOOD BLOOD RIGHT HAND  Final   Special Requests   Final    BOTTLES DRAWN AEROBIC ONLY Blood Culture adequate volume   Culture   Final    NO GROWTH 3 DAYS Performed at [facility], [address], [place], [place] [address]    Report Status PENDING  Incomplete  CULTURE, BLOOD (ROUTINE X 2) w Reflex to ID Panel     Status: None (Preliminary result)   Collection Time: 11/01/19 10:21 PM   Specimen: Right Antecubital; Blood  Result Value Ref Range Status   Specimen Description RIGHT ANTECUBITAL  Final   Special Requests   Final    BOTTLES DRAWN AEROBIC AND ANAEROBIC Blood Culture adequate volume   Culture   Final    NO GROWTH 3 DAYS Performed at [facility], [address], [place], [place] [address]    Report Status PENDING  Incomplete         Radiology Studies: No results found.      Scheduled Meds: . feeding supplement (ENSURE ENLIVE)  237 mL Oral TID BM  . folic acid  2 mg Oral Daily  . gabapentin  300 mg Oral Daily  . LORazepam  0-4 mg Intravenous Q6H   Or  . LORazepam  0-4 mg Oral Q6H  . LORazepam  2 mg Intravenous Once  . methylPREDNISolone (SOLU-MEDROL) injection  32 mg Intravenous Q24H  . multivitamin with minerals  1 tablet Oral Daily  . pantoprazole  40 mg Oral BID  . potassium chloride  20 mEq Oral TID  . rifaximin  550 mg Oral BID   Continuous Infusions: . sodium chloride Stopped (11/02/19 0712)  . levETIRAcetam 1,000 mg (11/04/19 0524)  . potassium chloride 10 mEq (11/04/19 1106)  . thiamine injection 500 mg (11/03/19 1314)     LOS: 3 days    Time spent: 30 minutes    Dorcas Carrow, MD [facility] Pager [phone]

## 2019-11-04 NOTE — Progress Notes (Signed)
Brett Repress, MD [address]  [address]  [place], [place] [address]  Main: [phone]  Fax: [phone] Pager: [phone]   Subjective: Patient just woke up in the afternoon.  He was agitated last night.  He had several bowel movements last night.  Lactulose has been held this morning.  Patient has a sitter who reported that he had good breakfast today.  He appears confused and also had urinary incontinence   Objective: Vital signs in last 24 hours: Vitals:   11/04/19 0307 11/04/19 0402 11/04/19 0557 11/04/19 0737  BP: 109/75 113/82 100/77 109/75  Pulse: 80 81 86 84  Resp: 20 18 18 20   Temp: (!) 97.5 F (36.4 C) 98 F (36.7 C)  98 F (36.7 C)  TempSrc: Oral     SpO2: 100% 100% 100% 100%  Weight: 77.1 kg     Height:       Weight change: 0.227 kg  Intake/Output Summary (Last 24 hours) at 11/04/2019 1509 Last data filed at 11/04/2019 1239 Gross per 24 hour  Intake 2780 ml  Output 2052 ml  Net 728 ml     Exam: Heart:: Regular rate and rhythm, S1S2 present or without murmur or extra heart sounds Lungs: normal and clear to auscultation Abdomen: soft, nontender, normal bowel sounds   Lab Results: CBC Latest Ref Rng & Units 11/04/2019 11/03/2019 11/02/2019  WBC 4.0 - 10.5 K/uL 12.8(H) 10.8(H) 10.1  Hemoglobin 13.0 - 17.0 g/dL 11.6(L) 11.1(L) 11.5(L)  Hematocrit 39 - 52 % 33.9(L) 32.6(L) 33.2(L)  Platelets 150 - 400 K/uL 103(L) 83(L) 84(L)   CMP Latest Ref Rng & Units 11/04/2019 11/03/2019 11/02/2019  Glucose 70 - 99 mg/dL 94 11/04/2019) 95  BUN 6 - 20 mg/dL 962(X) <5(M) <8(U)  Creatinine 0.61 - 1.24 mg/dL <1(L) <2.44(W) <1.02(V)  Sodium 135 - 145 mmol/L 139 138 140  Potassium 3.5 - 5.1 mmol/L 2.7(LL) 2.7(LL) 3.2(L)  Chloride 98 - 111 mmol/L 108 106 109  CO2 22 - 32 mmol/L 28 25 25   Calcium 8.9 - 10.3 mg/dL 8.0(L) 7.7(L) 7.3(L)  Total Protein 6.5 - 8.1 g/dL 6.5 5.9(L) 6.7  Total Bilirubin 0.3 - 1.2 mg/dL 19.1(HH) 16.8(H) 19.6(HH)  Alkaline Phos 38 - 126  U/L 183(H) 180(H) 219(H)  AST 15 - 41 U/L 105(H) 93(H) 147(H)  ALT 0 - 44 U/L 30 23 28     Micro Results: Recent Results (from the past 240 hour(s))  SARS Coronavirus 2 by RT PCR (hospital order, performed in [facility] hospital lab) Nasopharyngeal Nasopharyngeal Swab     Status: None   Collection Time: 11/01/19 11:45 AM   Specimen: Nasopharyngeal Swab  Result Value Ref Range Status   SARS Coronavirus 2 NEGATIVE NEGATIVE Final    Comment: (NOTE) SARS-CoV-2 target nucleic acids are NOT DETECTED.  The SARS-CoV-2 RNA is generally detectable in upper and lower respiratory specimens during the acute phase of infection. The lowest concentration of SARS-CoV-2 viral copies this assay can detect is 250 copies / mL. A negative result does not preclude SARS-CoV-2 infection and should not be used as the sole basis for treatment or other patient management decisions.  A negative result may occur with improper specimen collection / handling, submission of specimen other than nasopharyngeal swab, presence of viral mutation(s) within the areas targeted by this assay, and inadequate number of viral copies (<250 copies / mL). A negative result must be combined with clinical observations, patient history, and epidemiological information.  Fact Sheet for Patients:  BoilerBrush.com.cy  Fact Sheet for Healthcare Providers: https://pope.com/  This test is not yet approved or  cleared by the [place] FDA and has been authorized for detection and/or diagnosis of SARS-CoV-2 by FDA under an Emergency Use Authorization (EUA).  This EUA will remain in effect (meaning this test can be used) for the duration of the COVID-19 declaration under Section 564(b)(1) of the Act, 21 U.S.C. section 360bbb-3(b)(1), unless the authorization is terminated or revoked sooner.  Performed at [facility], [address] [place], [place] [address]   CULTURE,  BLOOD (ROUTINE X 2) w Reflex to ID Panel     Status: None (Preliminary result)   Collection Time: 11/01/19  5:02 PM   Specimen: BLOOD  Result Value Ref Range Status   Specimen Description BLOOD BLOOD RIGHT HAND  Final   Special Requests   Final    BOTTLES DRAWN AEROBIC ONLY Blood Culture adequate volume   Culture   Final    NO GROWTH 3 DAYS Performed at [facility], [address], [place], [place] [address]    Report Status PENDING  Incomplete  CULTURE, BLOOD (ROUTINE X 2) w Reflex to ID Panel     Status: None (Preliminary result)   Collection Time: 11/01/19 10:21 PM   Specimen: Right Antecubital; Blood  Result Value Ref Range Status   Specimen Description RIGHT ANTECUBITAL  Final   Special Requests   Final    BOTTLES DRAWN AEROBIC AND ANAEROBIC Blood Culture adequate volume   Culture   Final    NO GROWTH 3 DAYS Performed at [facility], [address] [place], [place] [address]    Report Status PENDING  Incomplete   Studies/Results: No results found. Medications:  I have reviewed the patient's current medications. Prior to Admission:  No medications prior to admission.   Scheduled: . feeding supplement (ENSURE ENLIVE)  237 mL Oral TID BM  . folic acid  2 mg Oral Daily  . gabapentin  300 mg Oral Daily  . LORazepam  0-4 mg Intravenous Q6H   Or  . LORazepam  0-4 mg Oral Q6H  . LORazepam  2 mg Intravenous Once  . methylPREDNISolone (SOLU-MEDROL) injection  32 mg Intravenous Q24H  . multivitamin with minerals  1 tablet Oral Daily  . pantoprazole  40 mg Oral BID  . potassium chloride  20 mEq Oral TID  . rifaximin  550 mg Oral BID   Continuous: . sodium chloride Stopped (11/02/19 0712)  . levETIRAcetam 1,000 mg (11/04/19 0524)  . phytonadione (VITAMIN K) IV    . thiamine injection 500 mg (11/03/19 1314)   QIO:NGEXBM chloride, lactulose, LORazepam, ondansetron **OR** ondansetron (ZOFRAN) IV, sodium chloride flush Anti-infectives (From admission,  onward)   Start     Dose/Rate Route Frequency Ordered Stop   11/02/19 1100  cefTRIAXone (ROCEPHIN) 1 g in sodium chloride 0.9 % 100 mL IVPB  Status:  Discontinued        1 g 200 mL/hr over 30 Minutes Intravenous Every 24 hours 11/01/19 1523 11/02/19 1320   11/01/19 2200  rifaximin (XIFAXAN) tablet 550 mg     Discontinue     550 mg Oral 2 times daily 11/01/19 1801 11/15/19 2159   11/01/19 1415  rifaximin (XIFAXAN) tablet 200 mg        200 mg Oral  Once 11/01/19 1413 11/01/19 1552   11/01/19 1130  cefTRIAXone (ROCEPHIN) 1 g in sodium chloride 0.9 % 100 mL IVPB  1 g 200 mL/hr over 30 Minutes Intravenous  Once 11/01/19 1124 11/01/19 1336     Scheduled Meds: . feeding supplement (ENSURE ENLIVE)  237 mL Oral TID BM  . folic acid  2 mg Oral Daily  . gabapentin  300 mg Oral Daily  . LORazepam  0-4 mg Intravenous Q6H   Or  . LORazepam  0-4 mg Oral Q6H  . LORazepam  2 mg Intravenous Once  . methylPREDNISolone (SOLU-MEDROL) injection  32 mg Intravenous Q24H  . multivitamin with minerals  1 tablet Oral Daily  . pantoprazole  40 mg Oral BID  . potassium chloride  20 mEq Oral TID  . rifaximin  550 mg Oral BID   Continuous Infusions: . sodium chloride Stopped (11/02/19 0712)  . levETIRAcetam 1,000 mg (11/04/19 0524)  . phytonadione (VITAMIN K) IV    . thiamine injection 500 mg (11/03/19 1314)   PRN Meds:.sodium chloride, lactulose, LORazepam, ondansetron **OR** ondansetron (ZOFRAN) IV, sodium chloride flush   Assessment: Principal Problem:   Hematemesis Active Problems:   Thrombocytopenia (HCC)   Hypokalemia   Acute hepatic encephalopathy   Leukocytosis   Abnormal LFTs   Seizure (HCC)   Alcohol abuse  Aeric Burnham is a 39 y.o. male with alcohol abuse, several admissions with alcohol intoxication presented with alcoholic hepatitis with acute alcoholic liver failure and hematemesis  Plan: Hematemesis: Self-limited secondary to erosive esophagitis S/p EGD on 7/20, no  evidence of peptic ulcer disease or esophageal varices Continue Protonix 40 mg p.o. twice daily before meals long-term   Acute liver failure, alcoholic cirrhosis with alcoholic hepatitis LFTs are unchanged, T bili 21.1>>19.6>>16.8>>19.1 Methylprednisolone has been started on 7/20 for alcoholic hepatitis due to high maddery discriminant function, calculate Lille score on day 5 to assess response to steroids No evidence of infection, blood cultures negative to date, urine negative Maintain low-sodium diet Encourage p.o. nutrition with high-protein diet and protein supplements 2-3 times daily Monitor coags daily, INR is elevated, administer vitamin K 10 mg daily for 3 days in setting of acute liver failure Continue multivitamin plus thiamine and folate.  Patient has severe folic acid deficiency, increase folate to 2 mg daily for 3 days then 1 mg daily Hold lactulose as patient had several bowel movements which can lead to dehydration and AKI Continue rifaximin for hepatic encephalopathy   Alcohol abuse Recommend gabapentin 100 mg 3 times daily  Overall prognosis is guarded, recommend palliative care consult   LOS: 3 days   Minette Manders 11/04/2019, 3:09 PM

## 2019-11-05 DIAGNOSIS — Z7189 Other specified counseling: Secondary | ICD-10-CM

## 2019-11-05 DIAGNOSIS — Z515 Encounter for palliative care: Secondary | ICD-10-CM

## 2019-11-05 LAB — MAGNESIUM: Magnesium: 1.8 mg/dL (ref 1.7–2.4)

## 2019-11-05 LAB — COMPREHENSIVE METABOLIC PANEL
ALT: 32 U/L (ref 0–44)
AST: 105 U/L — ABNORMAL HIGH (ref 15–41)
Albumin: 1.8 g/dL — ABNORMAL LOW (ref 3.5–5.0)
Alkaline Phosphatase: 160 U/L — ABNORMAL HIGH (ref 38–126)
Anion gap: 6 (ref 5–15)
BUN: <5 mg/dL — ABNORMAL LOW (ref 6–20)
CO2: 26 mmol/L (ref 22–32)
Calcium: 7.7 mg/dL — ABNORMAL LOW (ref 8.9–10.3)
Chloride: 104 mmol/L (ref 98–111)
Creatinine, Ser: <0.3 mg/dL — ABNORMAL LOW (ref 0.61–1.24)
Glucose, Bld: 112 mg/dL — ABNORMAL HIGH (ref 70–99)
Potassium: 3 mmol/L — ABNORMAL LOW (ref 3.5–5.1)
Sodium: 136 mmol/L (ref 135–145)
Total Bilirubin: 20.5 mg/dL (ref 0.3–1.2)
Total Protein: 6.2 g/dL — ABNORMAL LOW (ref 6.5–8.1)

## 2019-11-05 LAB — CBC WITH DIFFERENTIAL/PLATELET
Abs Immature Granulocytes: 0.18 10*3/uL — ABNORMAL HIGH (ref 0.00–0.07)
Basophils Absolute: 0.1 10*3/uL (ref 0.0–0.1)
Basophils Relative: 0 %
Eosinophils Absolute: 0 10*3/uL (ref 0.0–0.5)
Eosinophils Relative: 0 %
HCT: 33.2 % — ABNORMAL LOW (ref 39.0–52.0)
Hemoglobin: 11.3 g/dL — ABNORMAL LOW (ref 13.0–17.0)
Immature Granulocytes: 1 %
Lymphocytes Relative: 9 %
Lymphs Abs: 1.5 10*3/uL (ref 0.7–4.0)
MCH: 35.2 pg — ABNORMAL HIGH (ref 26.0–34.0)
MCHC: 34 g/dL (ref 30.0–36.0)
MCV: 103.4 fL — ABNORMAL HIGH (ref 80.0–100.0)
Monocytes Absolute: 0.8 10*3/uL (ref 0.1–1.0)
Monocytes Relative: 5 %
Neutro Abs: 15.2 10*3/uL — ABNORMAL HIGH (ref 1.7–7.7)
Neutrophils Relative %: 85 %
Platelets: 119 10*3/uL — ABNORMAL LOW (ref 150–400)
RBC: 3.21 MIL/uL — ABNORMAL LOW (ref 4.22–5.81)
RDW: 15.4 % (ref 11.5–15.5)
WBC: 17.9 10*3/uL — ABNORMAL HIGH (ref 4.0–10.5)
nRBC: 0 % (ref 0.0–0.2)

## 2019-11-05 LAB — PROTIME-INR
INR: 2.1 — ABNORMAL HIGH (ref 0.8–1.2)
Prothrombin Time: 22.9 seconds — ABNORMAL HIGH (ref 11.4–15.2)

## 2019-11-05 LAB — VITAMIN B1: Vitamin B1 (Thiamine): 99.2 nmol/L (ref 66.5–200.0)

## 2019-11-05 LAB — AMMONIA: Ammonia: 94 umol/L — ABNORMAL HIGH (ref 9–35)

## 2019-11-05 LAB — PHOSPHORUS: Phosphorus: UNDETERMINED mg/dL (ref 2.5–4.6)

## 2019-11-05 MED ORDER — MAGNESIUM SULFATE 2 GM/50ML IV SOLN
2.0000 g | Freq: Once | INTRAVENOUS | Status: AC
  Administered 2019-11-05: 2 g via INTRAVENOUS
  Filled 2019-11-05: qty 50

## 2019-11-05 MED ORDER — PREDNISOLONE 5 MG PO TABS
40.0000 mg | ORAL_TABLET | Freq: Every day | ORAL | Status: DC
  Filled 2019-11-05: qty 8

## 2019-11-05 MED ORDER — OXYCODONE HCL 5 MG PO TABS
5.0000 mg | ORAL_TABLET | Freq: Once | ORAL | Status: AC
  Administered 2019-11-05: 5 mg via ORAL
  Filled 2019-11-05: qty 1

## 2019-11-05 MED ORDER — PREDNISONE 20 MG PO TABS
40.0000 mg | ORAL_TABLET | Freq: Every day | ORAL | Status: DC
  Administered 2019-11-05 – 2019-11-09 (×5): 40 mg via ORAL
  Filled 2019-11-05 (×5): qty 2

## 2019-11-05 NOTE — Progress Notes (Signed)
[name], MD [address]  [address]  [place], [place] [address]  Main: [phone]  Fax: [phone] Pager: [phone]   Subjective: Brett Washington remains confused, no bowel movements today but Brett Washington is passing gas.  Brett Washington ate lunch.  Has been afebrile in last 24 hours, has been hemodynamically stable Sitter is bedside   Objective: Vital signs in last 24 hours: Vitals:   11/04/19 0737 11/04/19 1916 11/05/19 0446 11/05/19 0815  BP: 109/75 (!) 101/57 107/69 112/72  Pulse: 84 84 68 70  Resp: 20 16 16 17   Temp: 98 F (36.7 C) 98 F (36.7 C) 98.6 F (37 C) 98.7 F (37.1 C)  TempSrc:      SpO2: 100% 96% 100% 99%  Weight:      Height:       Weight change:   Intake/Output Summary (Last 24 hours) at 11/05/2019 1659 Last data filed at 11/05/2019 0514 Gross per 24 hour  Intake 340 ml  Output --  Net 340 ml     Exam: Heart:: Regular rate and rhythm, S1S2 present or without murmur or extra heart sounds Lungs: normal and clear to auscultation Abdomen: soft, nontender, normal bowel sounds   Lab Results: CBC Latest Ref Rng & Units 11/05/2019 11/04/2019 11/03/2019  WBC 4.0 - 10.5 K/uL 17.9(H) 12.8(H) 10.8(H)  Hemoglobin 13.0 - 17.0 g/dL 11.3(L) 11.6(L) 11.1(L)  Hematocrit 39 - 52 % 33.2(L) 33.9(L) 32.6(L)  Platelets 150 - 400 K/uL 119(L) 103(L) 83(L)   CMP Latest Ref Rng & Units 11/05/2019 11/04/2019 11/03/2019  Glucose 70 - 99 mg/dL 11/05/2019) 94 323(F)  BUN 6 - 20 mg/dL 573(U) <2(G) <2(R)  Creatinine 0.61 - 1.24 mg/dL <4(Y) <7.06(C) <3.76(E)  Sodium 135 - 145 mmol/L 136 139 138  Potassium 3.5 - 5.1 mmol/L 3.0(L) 2.7(LL) 2.7(LL)  Chloride 98 - 111 mmol/L 104 108 106  CO2 22 - 32 mmol/L 26 28 25   Calcium 8.9 - 10.3 mg/dL 7.7(L) 8.0(L) 7.7(L)  Total Protein 6.5 - 8.1 g/dL 6.2(L) 6.5 5.9(L)  Total Bilirubin 0.3 - 1.2 mg/dL 20.5(HH) 19.1(HH) 16.8(H)  Alkaline Phos 38 - 126 U/L 160(H) 183(H) 180(H)  AST 15 - 41 U/L 105(H) 105(H) 93(H)  ALT 0 - 44 U/L 32 30 23    Micro  Results: Recent Results (from the past 240 hour(s))  SARS Coronavirus 2 by RT PCR (hospital order, performed in [facility] hospital lab) Nasopharyngeal Nasopharyngeal Swab     Status: None   Collection Time: 11/01/19 11:45 AM   Specimen: Nasopharyngeal Swab  Result Value Ref Range Status   SARS Coronavirus 2 NEGATIVE NEGATIVE Final    Comment: (NOTE) SARS-CoV-2 target nucleic acids are NOT DETECTED.  The SARS-CoV-2 RNA is generally detectable in upper and lower respiratory specimens during the acute phase of infection. The lowest concentration of SARS-CoV-2 viral copies this assay can detect is 250 copies / mL. A negative result does not preclude SARS-CoV-2 infection and should not be used as the sole basis for treatment or other patient management decisions.  A negative result may occur with improper specimen collection / handling, submission of specimen other than nasopharyngeal swab, presence of viral mutation(s) within the areas targeted by this assay, and inadequate number of viral copies (<250 copies / mL). A negative result must be combined with clinical observations, patient history, and epidemiological information.  Fact Sheet for Patients:   [facility]  Fact Sheet for Healthcare Providers: 11/03/19  This test is not yet approved or  cleared by  the [employer] and has been authorized for detection and/or diagnosis of SARS-CoV-2 by FDA under an Emergency Use Authorization (EUA).  This EUA will remain in effect (meaning this test can be used) for the duration of the COVID-19 declaration under Section 564(b)(1) of the Act, 21 U.S.C. section 360bbb-3(b)(1), unless the authorization is terminated or revoked sooner.  Performed at [facility], [address] [place], [place] [address]   CULTURE, BLOOD (ROUTINE X 2) w Reflex to ID Panel     Status: None (Preliminary result)   Collection Time:  11/01/19  5:02 PM   Specimen: BLOOD  Result Value Ref Range Status   Specimen Description BLOOD BLOOD RIGHT HAND  Final   Special Requests   Final    BOTTLES DRAWN AEROBIC ONLY Blood Culture adequate volume   Culture   Final    NO GROWTH 4 DAYS Performed at [facility] [address], [place], [place] [address]    Report Status PENDING  Incomplete  CULTURE, BLOOD (ROUTINE X 2) w Reflex to ID Panel     Status: None (Preliminary result)   Collection Time: 11/01/19 10:21 PM   Specimen: Right Antecubital; Blood  Result Value Ref Range Status   Specimen Description RIGHT ANTECUBITAL  Final   Special Requests   Final    BOTTLES DRAWN AEROBIC AND ANAEROBIC Blood Culture adequate volume   Culture   Final    NO GROWTH 4 DAYS Performed at [facility], [address] [place], [place] [address]    Report Status PENDING  Incomplete   Studies/Results: No results found. Medications:  I have reviewed the patient's current medications. Prior to Admission:  No medications prior to admission.   Scheduled: . feeding supplement (ENSURE ENLIVE)  237 mL Oral TID BM  . folic acid  2 mg Oral Daily  . gabapentin  300 mg Oral Daily  . LORazepam  0-4 mg Intravenous Q6H   Or  . LORazepam  0-4 mg Oral Q6H  . LORazepam  2 mg Intravenous Once  . multivitamin with minerals  1 tablet Oral Daily  . pantoprazole  40 mg Oral BID  . potassium chloride  20 mEq Oral TID  . predniSONE  40 mg Oral Q breakfast  . rifaximin  550 mg Oral BID   Continuous: . sodium chloride Stopped (11/02/19 0712)  . levETIRAcetam Stopped (11/05/19 0514)  . phytonadione (VITAMIN K) IV 10 mg (11/05/19 1148)   VEH:MCNOBS chloride, lactulose, LORazepam, ondansetron **OR** ondansetron (ZOFRAN) IV, sodium chloride flush Anti-infectives (From admission, onward)   Start     Dose/Rate Route Frequency Ordered Stop   11/02/19 1100  cefTRIAXone (ROCEPHIN) 1 g in sodium chloride 0.9 % 100 mL IVPB  Status:   Discontinued        1 g 200 mL/hr over 30 Minutes Intravenous Every 24 hours 11/01/19 1523 11/02/19 1320   11/01/19 2200  rifaximin (XIFAXAN) tablet 550 mg     Discontinue     550 mg Oral 2 times daily 11/01/19 1801 11/15/19 2159   11/01/19 1415  rifaximin (XIFAXAN) tablet 200 mg        200 mg Oral  Once 11/01/19 1413 11/01/19 1552   11/01/19 1130  cefTRIAXone (ROCEPHIN) 1 g in sodium chloride 0.9 % 100 mL IVPB        1 g 200 mL/hr over 30 Minutes Intravenous  Once 11/01/19 1124 11/01/19 1336     Scheduled Meds: . feeding supplement (ENSURE ENLIVE)  237 mL Oral TID BM  . folic acid  2 mg Oral Daily  . gabapentin  300 mg Oral Daily  . LORazepam  0-4 mg Intravenous Q6H   Or  . LORazepam  0-4 mg Oral Q6H  . LORazepam  2 mg Intravenous Once  . multivitamin with minerals  1 tablet Oral Daily  . pantoprazole  40 mg Oral BID  . potassium chloride  20 mEq Oral TID  . predniSONE  40 mg Oral Q breakfast  . rifaximin  550 mg Oral BID   Continuous Infusions: . sodium chloride Stopped (11/02/19 0712)  . levETIRAcetam Stopped (11/05/19 0514)  . phytonadione (VITAMIN K) IV 10 mg (11/05/19 1148)   PRN Meds:.sodium chloride, lactulose, LORazepam, ondansetron **OR** ondansetron (ZOFRAN) IV, sodium chloride flush   Assessment: Principal Problem:   Hematemesis Active Problems:   Thrombocytopenia (HCC)   Hypokalemia   Acute hepatic encephalopathy   Leukocytosis   Abnormal LFTs   Seizure (HCC)   Alcohol abuse   Goals of care, counseling/discussion   Palliative care by specialist  Brett Washington is a 39 y.o. male with alcohol abuse, several admissions with alcohol intoxication presented with alcoholic hepatitis with acute alcoholic liver failure and hematemesis  Plan: Hematemesis: Self-limited secondary to erosive esophagitis S/p EGD on 7/20, no evidence of peptic ulcer disease or esophageal varices Continue Protonix 40 mg p.o. twice daily before meals long-term Hemoglobin is  stable  Acute liver failure, alcoholic cirrhosis with alcoholic hepatitis LFTs are unchanged, T bili 21.1>>19.6>>16.8>>19.1>>20.5 Methylprednisolone has been started on 7/20 for alcoholic hepatitis due to high maddery discriminant function,.  I switched to prednisolone 40 mg daily on 7/23.  Calculate Lille score on day 5 to assess response to steroids, if patient's bilirubin is up trending despite steroids, recommend to discontinue tomorrow instead of waiting until day 5 No evidence of infection, blood cultures negative to date, urine negative Maintain low-sodium diet Encourage p.o. nutrition with high-protein diet and protein supplements 2-3 times daily Monitor coags daily, INR is elevated, administer vitamin K 10 mg daily for 3 days in setting of acute liver failure, day 2 today Continue multivitamin plus thiamine and folate.  Patient has severe folic acid deficiency, increase folate to 2 mg daily for 3 days then 1 mg daily Continue lactulose 20 g twice daily, hold if bowel movements are more than 2 to 3/day to prevent dehydration and AKI Continue rifaximin for hepatic encephalopathy   Alcohol abuse Recommend gabapentin 100 mg 3 times daily  Overall prognosis is guarded, recommend palliative care consult   LOS: 4 days   Brett Washington 11/05/2019, 4:59 PM

## 2019-11-05 NOTE — Consult Note (Signed)
Consultation Note Date: 11/05/2019   Patient Name: Brett Washington  DOB: 27-Sep-1980  MRN: [id number]  Age / Sex: 39 y.o., male  [facility]P: [facility] Referring Physician: Dorcas Carrow, MD  Reason for Consultation: Establishing goals of care  HPI/Patient Profile: 39 y.o. male  with past medical history of ETOH abuse, seizures, and frequent hospitalizations r/t ETOH  admitted on 11/01/2019 with seizures, confusion, and hematemesis. Underwent EGD that showed erosive gastritis. Patient with acute liver failure with alcoholic cirrhosis and alcoholic hepatitis. PMT consulted for GOC.  Clinical Assessment and Goals of Care: I have reviewed medical records including EPIC notes, labs and imaging, received report from RN and sitter at bedside, assessed the patient and then spoke with patient's wife Brett Washington  to discuss diagnosis prognosis, GOC, EOL wishes, disposition and options.  Patient is unable to discuss goals of care d/t AMS. He is sleeping and not interactive during my visit. Per nursing staff he is confused when awake. Mental status fluctuates.   When speaking to Brett Washington, I introduced Palliative Medicine as specialized medical care for people living with serious illness. It focuses on providing relief from the symptoms and stress of a serious illness. The goal is to improve quality of life for both the patient and the family.  Brett Washington tells me that she and patient are legally married but he doesn't live with her all of the time d/t alcohol use. She tells me she has 3 kids and this makes situation complicated. She tells me patient has been struggling with alcoholism for 6-7 year; however, his alcohol use has worsened drastically over the past 4-5 months as patient has not been able to find work and spends most of his time consuming alcohol.   Brett Washington tells me patient has went to ETOH treatment at [facility] - stayed for 7 days and when he left he  immediately resumed alcohol use. She tells me of multiple intervention attempts to help [name] with alcoholism.  As far as functional and nutritional status, she tells me Doniven is still able to care for himself. She tells me his appetite has diminished - he main intake is alcohol. She and family attempt to bring him food that he loves but he refuses.    We discussed patient's current illness and what it means in the larger context of patient's on-going co-morbidities.  Natural disease trajectory and expectations at EOL were discussed. Brett Washington understands severity of patient's illness but she fears his family - his mother, father, and sister - do not understand how sick he is.   Brett Washington expresses feelings of hopelessness and tells me how overwhelmed she is with situation - she tells me about all of her attempts to help patient. Emotional support provided.   Encouraged Brett Washington to consider DNR/DNI status understanding evidenced based poor outcomes in similar hospitalized patients, as the cause of the arrest is likely associated with chronic/terminal disease rather than a reversible acute cardio-pulmonary event. Brett Washington agrees that DNR is appropriate but is hesitant to make this decision w/o patient's mother and father. Mother and father are in [place] and will not return until mid-August.   Discussed with Brett Washington the importance of continued conversation with family and the medical providers regarding overall plan of care and treatment options, ensuring decisions are within the context of the patient's values and GOCs.    We discussed a plan to see how patient does over weekend, if he has not improved or has declined by early next week we will schedule  meeting to include patient's sister for further decision maker. Brett Washington would feel more comfortable making decisions with sister present.   Questions and concerns were addressed. The family was encouraged to call with questions or concerns.   Primary Decision Maker NEXT  OF KIN - spouse    SUMMARY OF RECOMMENDATIONS   - wife understands poor prognosis, wife would agree to DNR however hesitant to make decision without patient's family and his parents are in [place] until mid august - she fears they do not understand how ill patient is - wife would like to follow up early next week - if patient remains hospitalized and not improving she agrees to make further decisions with patient's sister present - if d/c'd prior to follow up would recommend outpatient palliative care referral  Code Status/Advance Care Planning:  Full code  Prognosis:   Unable to determine  Discharge Planning: To Be Determined      Primary Diagnoses: Present on Admission: . Hematemesis . Hypokalemia . Thrombocytopenia (HCC) . Acute hepatic encephalopathy . Leukocytosis . Abnormal LFTs . Alcohol abuse   I have reviewed the medical record, interviewed the patient and family, and examined the patient. The following aspects are pertinent.  Past Medical History:  Diagnosis Date  . Alcohol abuse   . Heart murmur   . Seizures (HCC)    Social History   Socioeconomic History  . Marital status: Married    Spouse name: Not on file  . Number of children: Not on file  . Years of education: Not on file  . Highest education level: Not on file  Occupational History  . Not on file  Tobacco Use  . Smoking status: Never Smoker  . Smokeless tobacco: Never Used  Vaping Use  . Vaping Use: Never used  Substance and Sexual Activity  . Alcohol use: Yes    Alcohol/week: 56.0 standard drinks    Types: 56 Cans of beer per week  . Drug use: No  . Sexual activity: Not on file  Other Topics Concern  . Not on file  Social History Narrative  . Not on file   Social Determinants of Health   Financial Resource Strain:   . Difficulty of Paying Living Expenses:   Food Insecurity:   . Worried About Programme researcher, broadcasting/film/video in the Last Year:   . Barista in the Last Year:     Transportation Needs:   . Freight forwarder (Medical):   Marland Kitchen Lack of Transportation (Non-Medical):   Physical Activity:   . Days of Exercise per Week:   . Minutes of Exercise per Session:   Stress:   . Feeling of Stress :   Social Connections:   . Frequency of Communication with Friends and Family:   . Frequency of Social Gatherings with Friends and Family:   . Attends Religious Services:   . Active Member of Clubs or Organizations:   . Attends Banker Meetings:   Marland Kitchen Marital Status:    Family History  Problem Relation Age of Onset  . Diabetes Mellitus II Mother    Scheduled Meds: . feeding supplement (ENSURE ENLIVE)  237 mL Oral TID BM  . folic acid  2 mg Oral Daily  . gabapentin  300 mg Oral Daily  . LORazepam  0-4 mg Intravenous Q6H   Or  . LORazepam  0-4 mg Oral Q6H  . LORazepam  2 mg Intravenous Once  . multivitamin with minerals  1 tablet Oral Daily  .  pantoprazole  40 mg Oral BID  . potassium chloride  20 mEq Oral TID  . predniSONE  40 mg Oral Q breakfast  . rifaximin  550 mg Oral BID   Continuous Infusions: . sodium chloride Stopped (11/02/19 0712)  . levETIRAcetam Stopped (11/05/19 0514)  . phytonadione (VITAMIN K) IV 10 mg (11/05/19 1148)   PRN Meds:.sodium chloride, lactulose, LORazepam, ondansetron **OR** ondansetron (ZOFRAN) IV, sodium chloride flush No Known Allergies Review of Systems  Unable to perform ROS: Mental status change    Physical Exam Constitutional:      Comments: obtunded  Pulmonary:     Effort: Pulmonary effort is normal. No respiratory distress.     Vital Signs: BP 112/72 (BP Location: Right Arm)   Pulse 70   Temp 98.7 F (37.1 C)   Resp 17   Ht 5\' 8"  (1.727 m)   Wt 77.1 kg   SpO2 99%   BMI 25.85 kg/m  Pain Scale: 0-10   Pain Score: 0-No pain   SpO2: SpO2: 99 % O2 Device:SpO2: 99 % O2 Flow Rate: .   IO: Intake/output summary:   Intake/Output Summary (Last 24 hours) at 11/05/2019 1536 Last data  filed at 11/05/2019 0514 Gross per 24 hour  Intake 1240 ml  Output --  Net 1240 ml    LBM: Last BM Date: 11/03/19 Baseline Weight: Weight: 80 kg Most recent weight: Weight: 77.1 kg     Palliative Assessment/Data: PPS 60%    Time Total: 70 minutes Greater than 50%  of this time was spent counseling and coordinating care related to the above assessment and plan.  11/05/19, DNP, AGNP-C Palliative Medicine Team [phone] Pager: [phone]

## 2019-11-05 NOTE — Progress Notes (Signed)
PROGRESS NOTE    Brett Washington  GLO:[id number] DOB: [birth year] DOA: 11/01/2019 PCP: [facility]    Brief Narrative:  39 year old gentleman with alcohol abuse, frequent hospitalization related to alcoholism presented to the emergency room with 2 episodes of seizures at home and confusion, hematemesis. In the emergency room, mildly hypotensive and tachycardic responded to IV fluid resuscitation.  Hemoglobin 13. ALP 256/AST 164/ALT 32 and bilirubin of 21.  Hypokalemic, elevated lactic acid and leukocytosis. Patient was started on IV fluids, IV Protonix and octreotide and ceftriaxone, Keppra, CIWA protocol and admitted to the hospital.  He has been drinking about 6-8 beers per day continuous for last 5 years, unable to drink for the last 3 days due to nausea and vomiting resulting in withdrawals. 7/23, remains with alcohol withdrawal, impulsive and agitated as well as advance liver failure.   Assessment & Plan:   Principal Problem:   Hematemesis Active Problems:   Thrombocytopenia (HCC)   Hypokalemia   Acute hepatic encephalopathy   Leukocytosis   Abnormal LFTs   Seizure (HCC)   Alcohol abuse  Hematemesis/upper GI bleeding: Underwent EGD that showed erosive gastritis, no active bleeding.  Hemoglobin has remained stable.  On oral Protonix .  No evidence of bleeding.  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  Lactic acidosis.  Macrocytic anemia. Presented with [facility] discriminant function of 35, started on steroids. No evidence of infection. Currently remains on, Methylprednisone, with plan for prolonged therapy and gradual taper if he improves Multivitamins with thiamine and folic acid Vitamin K Appreciate GI recommendations Poor prognosis. Lactulose PRN to make 2-3 BM daily.  Alcoholism/alcohol withdrawal seizure: All-time seizure precautions.  Fall precautions.  Delirium precautions. Remains on  high-dose thiamine and folic acid replacement. Started on Keppra, will continue until clinical improvement.  Will change to oral once he has better clinical improvement. Initially on Rocephin, no evidence of SBP that is discontinued. Remains on rifaximin, lactulose with aim to keep to have 2-3 bowel movements. Use CIWA scale with Ativan.  Secondary school teacher for safety.  Hypokalemia: Severe and persistent.  Continue oral replacement.  Is not tolerating IV replacement.  Magnesium was replaced.  We will further replace.  Called and discussed case in detail with patient's wife, she was updated about very critical situation.  Patient's wife is aware about the poor prognosis and family has lost hope on him. We will consult palliative care today for coordination, education, possible palliation if he deteriorates further.  DVT prophylaxis: SCDs Start: 11/01/19 1531   Code Status: Full code Family Communication: Wife on the phone 7/22. Disposition Plan: Status is: Inpatient  Remains inpatient appropriate because:IV treatments appropriate due to intensity of illness or inability to take PO and Inpatient level of care appropriate due to severity of illness   Dispo: The patient is from: Home              Anticipated d/c is to: Home              Anticipated d/c date is: > 3 days              Patient currently is not medically stable to d/c.         Consultants:   Gastroenterology  Palliative care.  Procedures:   None  Antimicrobials:  Anti-infectives (From admission, onward)   Start     Dose/Rate Route Frequency Ordered Stop   11/02/19 1100  cefTRIAXone (ROCEPHIN) 1 g in sodium chloride 0.9 %  100 mL IVPB  Status:  Discontinued        1 g 200 mL/hr over 30 Minutes Intravenous Every 24 hours 11/01/19 1523 11/02/19 1320   11/01/19 2200  rifaximin (XIFAXAN) tablet 550 mg     Discontinue     550 mg Oral 2 times daily 11/01/19 1801 11/15/19 2159   11/01/19 1415  rifaximin (XIFAXAN)  tablet 200 mg        200 mg Oral  Once 11/01/19 1413 11/01/19 1552   11/01/19 1130  cefTRIAXone (ROCEPHIN) 1 g in sodium chloride 0.9 % 100 mL IVPB        1 g 200 mL/hr over 30 Minutes Intravenous  Once 11/01/19 1124 11/01/19 1336         Subjective: Patient seen and examined.  Episodic agitation.  Confused at times.  He was sleepy on my evaluation and will not wake up but keep his eyes closed.  Objective: Vitals:   11/04/19 0737 11/04/19 1916 11/05/19 0446 11/05/19 0815  BP: 109/75 (!) 101/57 107/69 112/72  Pulse: 84 84 68 70  Resp: 20 16 16 17   Temp: 98 F (36.7 C) 98 F (36.7 C) 98.6 F (37 C) 98.7 F (37.1 C)  TempSrc:      SpO2: 100% 96% 100% 99%  Weight:      Height:        Intake/Output Summary (Last 24 hours) at 11/05/2019 1257 Last data filed at 11/05/2019 0514 Gross per 24 hour  Intake 1240 ml  Output --  Net 1240 ml   Filed Weights   11/02/19 0609 11/03/19 0542 11/04/19 0307  Weight: 78.1 kg 76.9 kg 77.1 kg    Examination:  General exam: Sick looking.  Agitated at times.  Currently sleepy.  Icteric. Respiratory system: Clear to auscultation. Respiratory effort normal. No added sounds  Cardiovascular system: S1 & S2 heard, RRR.  Gastrointestinal system: Abdomen is nondistended, soft and nontender. No organomegaly or masses felt. Normal bowel sounds heard. Central nervous system: Alert and oriented x2-3.  No focal neurological deficits. Extremities: Symmetric 5 x 5 power. Skin: No rashes, lesions or ulcers Psychiatry: Judgement and insight appear normal. Mood & affect flat and withdrawn    Data Reviewed: I have personally reviewed following labs and imaging studies  CBC: Recent Labs  Lab 11/02/19 1534 11/02/19 2306 11/03/19 0712 11/04/19 0532 11/05/19 0538  WBC 11.6* 10.1 10.8* 12.8* 17.9*  NEUTROABS  --   --   --  10.7* 15.2*  HGB 10.9* 11.5* 11.1* 11.6* 11.3*  HCT 30.8* 33.2* 32.6* 33.9* 33.2*  MCV 101.0* 101.8* 102.5* 103.4* 103.4*  PLT  87* 84* 83* 103* 119*   Basic Metabolic Panel: Recent Labs  Lab 11/01/19 1024 11/01/19 1601 11/01/19 1702 11/02/19 0111 11/03/19 0712 11/04/19 0532 11/05/19 0538  NA 136  --   --  140 138 139 136  K 2.5*  --   --  3.2* 2.7* 2.7* 3.0*  CL 100  --   --  109 106 108 104  CO2 25  --   --  25 25 28 26   GLUCOSE 122*  --   --  95 134* 94 112*  BUN <5*  --   --  <5* <5* <5* <5*  CREATININE 0.37*  --   --  <0.30* <0.30* <0.30* <0.30*  CALCIUM 7.9*  --   --  7.3* 7.7* 8.0* 7.7*  MG  --   --  2.0 2.0  --  1.6* 1.8  PHOS  --  UNABLE TO REPORT DUE TO ICTERUS. QSD  --  UNABLE TO REPORT DUE TO ICTERUS / JAG  --   UNABLE TO REPORT DUE TO ICTERIC INTERFERENCE SDR  UNABLE TO REPORT DUE TO ICTERIC INTERFERENCE   GFR: CrCl cannot be calculated (This lab value cannot be used to calculate CrCl because it is not a number: <0.30). Liver Function Tests: Recent Labs  Lab 11/01/19 1024 11/02/19 0111 11/03/19 0712 11/04/19 0532 11/05/19 0538  AST 164* 147* 93* 105* 105*  ALT 32 28 23 30  32  ALKPHOS 256* 219* 180* 183* 160*  BILITOT 21.1* 19.6* 16.8* 19.1* 20.5*  PROT 7.6 6.7 5.9* 6.5 6.2*  ALBUMIN 2.2* 2.0* 1.7* 1.9* 1.8*   No results for input(s): LIPASE, AMYLASE in the last 168 hours. Recent Labs  Lab 11/01/19 1145 11/03/19 0712 11/05/19 0538  AMMONIA 117* 109* 94*   Coagulation Profile: Recent Labs  Lab 11/01/19 1024 11/03/19 1511 11/04/19 0532 11/05/19 0538  INR 1.6* 2.0* 2.0* 2.1*   Cardiac Enzymes: No results for input(s): CKTOTAL, CKMB, CKMBINDEX, TROPONINI in the last 168 hours. BNP (last 3 results) No results for input(s): PROBNP in the last 8760 hours. HbA1C: No results for input(s): HGBA1C in the last 72 hours. CBG: No results for input(s): GLUCAP in the last 168 hours. Lipid Profile: No results for input(s): CHOL, HDL, LDLCALC, TRIG, CHOLHDL, LDLDIRECT in the last 72 hours. Thyroid Function Tests: No results for input(s): TSH, T4TOTAL, FREET4, T3FREE, THYROIDAB in  the last 72 hours. Anemia Panel: Recent Labs    11/03/19 0712  VITAMINB12 3,116*   Sepsis Labs: Recent Labs  Lab 11/01/19 1601 11/01/19 1702 11/02/19 0714 11/02/19 1104 11/02/19 1553 11/02/19 1831  PROCALCITON 0.25  --   --   --   --   --   LATICACIDVEN  --    < > 2.3* 2.5* 1.8 1.5   < > = values in this interval not displayed.    Recent Results (from the past 240 hour(s))  SARS Coronavirus 2 by RT PCR (hospital order, performed in [facility] hospital lab) Nasopharyngeal Nasopharyngeal Swab     Status: None   Collection Time: 11/01/19 11:45 AM   Specimen: Nasopharyngeal Swab  Result Value Ref Range Status   SARS Coronavirus 2 NEGATIVE NEGATIVE Final    Comment: (NOTE) SARS-CoV-2 target nucleic acids are NOT DETECTED.  The SARS-CoV-2 RNA is generally detectable in upper and lower respiratory specimens during the acute phase of infection. The lowest concentration of SARS-CoV-2 viral copies this assay can detect is 250 copies / mL. A negative result does not preclude SARS-CoV-2 infection and should not be used as the sole basis for treatment or other patient management decisions.  A negative result may occur with improper specimen collection / handling, submission of specimen other than nasopharyngeal swab, presence of viral mutation(s) within the areas targeted by this assay, and inadequate number of viral copies (<250 copies / mL). A negative result must be combined with clinical observations, patient history, and epidemiological information.  Fact Sheet for Patients:   11/03/19  Fact Sheet for Healthcare Providers: BoilerBrush.com.cy  This test is not yet approved or  cleared by the https://pope.com/ FDA and has been authorized for detection and/or diagnosis of SARS-CoV-2 by FDA under an Emergency Use Authorization (EUA).  This EUA will remain in effect (meaning this test can be used) for the duration of  the COVID-19 declaration under Section 564(b)(1) of the Act, 21 U.S.C. section 360bbb-3(b)(1), unless the authorization is  terminated or revoked sooner.  Performed at [facility], [address] [place], [place] [address]   CULTURE, BLOOD (ROUTINE X 2) w Reflex to ID Panel     Status: None (Preliminary result)   Collection Time: 11/01/19  5:02 PM   Specimen: BLOOD  Result Value Ref Range Status   Specimen Description BLOOD BLOOD RIGHT HAND  Final   Special Requests   Final    BOTTLES DRAWN AEROBIC ONLY Blood Culture adequate volume   Culture   Final    NO GROWTH 4 DAYS Performed at [facility], [address] [place], [place] [address]    Report Status PENDING  Incomplete  CULTURE, BLOOD (ROUTINE X 2) w Reflex to ID Panel     Status: None (Preliminary result)   Collection Time: 11/01/19 10:21 PM   Specimen: Right Antecubital; Blood  Result Value Ref Range Status   Specimen Description RIGHT ANTECUBITAL  Final   Special Requests   Final    BOTTLES DRAWN AEROBIC AND ANAEROBIC Blood Culture adequate volume   Culture   Final    NO GROWTH 4 DAYS Performed at [facility], [address] [place], [place] [address]    Report Status PENDING  Incomplete         Radiology Studies: No results found.      Scheduled Meds: . feeding supplement (ENSURE ENLIVE)  237 mL Oral TID BM  . folic acid  2 mg Oral Daily  . gabapentin  300 mg Oral Daily  . LORazepam  0-4 mg Intravenous Q6H   Or  . LORazepam  0-4 mg Oral Q6H  . LORazepam  2 mg Intravenous Once  . multivitamin with minerals  1 tablet Oral Daily  . pantoprazole  40 mg Oral BID  . potassium chloride  20 mEq Oral TID  . predniSONE  40 mg Oral Q breakfast  . rifaximin  550 mg Oral BID   Continuous Infusions: . sodium chloride Stopped (11/02/19 0712)  . levETIRAcetam Stopped (11/05/19 0514)  . magnesium sulfate bolus IVPB    . phytonadione (VITAMIN K) IV 10 mg (11/05/19 1148)      LOS: 4 days    Time spent: 30 minutes    Dorcas CarrowKuber Winda Summerall, MD [facility] Pager [phone]

## 2019-11-05 NOTE — Progress Notes (Signed)
Critical bilirubin result 20.5 reported to E. Anna Genre, NP.  No new orders received.

## 2019-11-06 LAB — CBC WITH DIFFERENTIAL/PLATELET
Abs Immature Granulocytes: 0.15 10*3/uL — ABNORMAL HIGH (ref 0.00–0.07)
Basophils Absolute: 0.1 10*3/uL (ref 0.0–0.1)
Basophils Relative: 1 %
Eosinophils Absolute: 0.2 10*3/uL (ref 0.0–0.5)
Eosinophils Relative: 1 %
HCT: 31.7 % — ABNORMAL LOW (ref 39.0–52.0)
Hemoglobin: 10.7 g/dL — ABNORMAL LOW (ref 13.0–17.0)
Immature Granulocytes: 1 %
Lymphocytes Relative: 11 %
Lymphs Abs: 1.6 10*3/uL (ref 0.7–4.0)
MCH: 35.3 pg — ABNORMAL HIGH (ref 26.0–34.0)
MCHC: 33.8 g/dL (ref 30.0–36.0)
MCV: 104.6 fL — ABNORMAL HIGH (ref 80.0–100.0)
Monocytes Absolute: 1 10*3/uL (ref 0.1–1.0)
Monocytes Relative: 7 %
Neutro Abs: 11.5 10*3/uL — ABNORMAL HIGH (ref 1.7–7.7)
Neutrophils Relative %: 79 %
Platelets: 107 10*3/uL — ABNORMAL LOW (ref 150–400)
RBC: 3.03 MIL/uL — ABNORMAL LOW (ref 4.22–5.81)
RDW: 15.8 % — ABNORMAL HIGH (ref 11.5–15.5)
WBC: 14.5 10*3/uL — ABNORMAL HIGH (ref 4.0–10.5)
nRBC: 0 % (ref 0.0–0.2)

## 2019-11-06 LAB — COMPREHENSIVE METABOLIC PANEL
ALT: 34 U/L (ref 0–44)
AST: 104 U/L — ABNORMAL HIGH (ref 15–41)
Albumin: 1.6 g/dL — ABNORMAL LOW (ref 3.5–5.0)
Alkaline Phosphatase: 159 U/L — ABNORMAL HIGH (ref 38–126)
Anion gap: 6 (ref 5–15)
BUN: <5 mg/dL — ABNORMAL LOW (ref 6–20)
CO2: 25 mmol/L (ref 22–32)
Calcium: 7.8 mg/dL — ABNORMAL LOW (ref 8.9–10.3)
Chloride: 105 mmol/L (ref 98–111)
Creatinine, Ser: <0.3 mg/dL — ABNORMAL LOW (ref 0.61–1.24)
Glucose, Bld: 80 mg/dL (ref 70–99)
Potassium: 2.9 mmol/L — ABNORMAL LOW (ref 3.5–5.1)
Sodium: 136 mmol/L (ref 135–145)
Total Bilirubin: 20 mg/dL (ref 0.3–1.2)
Total Protein: 5.8 g/dL — ABNORMAL LOW (ref 6.5–8.1)

## 2019-11-06 LAB — CULTURE, BLOOD (ROUTINE X 2)
Culture: NO GROWTH
Culture: NO GROWTH
Special Requests: ADEQUATE
Special Requests: ADEQUATE

## 2019-11-06 LAB — AMMONIA: Ammonia: 72 umol/L — ABNORMAL HIGH (ref 9–35)

## 2019-11-06 MED ORDER — POTASSIUM CHLORIDE CRYS ER 20 MEQ PO TBCR
40.0000 meq | EXTENDED_RELEASE_TABLET | ORAL | Status: AC
  Administered 2019-11-06 (×2): 40 meq via ORAL
  Filled 2019-11-06 (×2): qty 2

## 2019-11-06 MED ORDER — MAGNESIUM SULFATE 2 GM/50ML IV SOLN
2.0000 g | Freq: Once | INTRAVENOUS | Status: AC
  Administered 2019-11-06: 2 g via INTRAVENOUS
  Filled 2019-11-06: qty 50

## 2019-11-06 NOTE — Progress Notes (Signed)
PROGRESS NOTE  Brett Washington  DOB: [birth year]  PCP: [facility] IWL:[id number]  DOA: 11/01/2019  LOS: 5 days   Chief Complaint  Patient presents with  . Hemoptysis  . Dizziness   Brief narrative: 39 year old gentleman with chronic alcohol abuse, related frequent hospitalizations. Patient presented to the ED on 7/19 after 2 episodes of seizures at home as well as hematemesis. Patient was started on IV fluids, IV Protonix and octreotide and ceftriaxone, Keppra, CIWA protocol and admitted to the hospital.  He was drinking about 6-8 beers per day continuous for last 5 years.3 days prior to presentation, he started having nausea and vomiting and was just not able to consume any alcohol.  He subsequently went into withdrawal symptoms and presented to the ED.    In the ED, patient was mildly hypotensive and tachycardic responded to IV fluid resuscitation. Hemoglobin 13,  ALP 256/AST 164/ALT 32 and bilirubin of 21.   Hypokalemic, elevated lactic acid and leukocytosis.  Patient was admitted to hospitalist service. GI and palliative care consults were obtained.  Subjective: Patient was seen and examined this morning. Under the effect of Ativan he got earlier.  Tries to open eyes and answer questions.  Positive positive daily.  Not in physical distress.  Assessment/Plan: Hematemesis/upper GI bleeding:  -7/20, underwent EGD that showed erosive gastritis, no active bleeding.   -No evidence of further bleeding.  Hemoglobin has remained stable.  On oral Protonix.  Acute liver failure with alcoholic cirrhosis and alcoholic hepatitis Chronic thrombocytopenia, Lactic acidosis -Presented with [facility] discriminant function of 35, started on steroids.  Remains on prednisone 40 mg daily. -GI consult appreciated. -Remains on rifaximin, lactulose with aim to keep to have 2-3 bowel movements. -Initially on Rocephin, no evidence of SBP that is discontinued.  Chronic  alcoholism Alcohol withdrawal seizure: -All-time seizure precautions.  Fall precautions.  Delirium precautions. -Remains on high-dose thiamine and folic acid replacement. -Currently on Keppra IV 1 g twice daily.   -Continue CIWA scale with Ativan.  Secondary school teacher for safety. -Continue multivitamins with thiamine and folic acid -Continue Vitamin K  Severe and persistent hypokalemia Hypomagnesemia -Potassium level low at 2.9 today.  Oral replacement ordered.   -Magnesium level low at 1.6 this morning.  2 g IV magnesium sulfate replacement ordered. -Repeat levels tomorrow.  Poor prognosis -Family not at bedside.  Previous hospitalist address palliative care team have discussed the case with patient's family.  Patient's wife is aware of his poor prognosis and his family has lost hope on him.  Family still undecided about CODE STATUS and want to maintain full code for now.  Palliative care meet with family again on Monday.  Mobility: Encourage ambulation Code Status:   Code Status: Full Code  Nutritional status: Body mass index is 25.85 kg/m. Nutrition Problem: Inadequate oral intake Etiology: acute illness Signs/Symptoms: meal completion < 25% Diet Order            Diet 2 gram sodium Room service appropriate? Yes; Fluid consistency: Thin  Diet effective now                 DVT prophylaxis: SCDs Start: 11/01/19 1531   Antimicrobials:  None Fluid: None  Consultants: GI, palliative care Family Communication:  None at bedside  Status is: Inpatient  Remains inpatient appropriate because:Persistent severe electrolyte disturbances, Ongoing diagnostic testing needed not appropriate for outpatient work up and IV treatments appropriate due to intensity of illness or inability to take PO   Dispo:  Patient From: Home  Planned Disposition: Home  Expected discharge date: 11/06/19  Medically stable for discharge: No        Infusions:  . sodium chloride Stopped (11/02/19  0712)  . levETIRAcetam Stopped (11/06/19 0617)    Scheduled Meds: . feeding supplement (ENSURE ENLIVE)  237 mL Oral TID BM  . folic acid  2 mg Oral Daily  . gabapentin  300 mg Oral Daily  . LORazepam  0-4 mg Intravenous Q6H   Or  . LORazepam  0-4 mg Oral Q6H  . multivitamin with minerals  1 tablet Oral Daily  . pantoprazole  40 mg Oral BID  . potassium chloride  40 mEq Oral Q2H  . predniSONE  40 mg Oral Q breakfast  . rifaximin  550 mg Oral BID    Antimicrobials: Anti-infectives (From admission, onward)   Start     Dose/Rate Route Frequency Ordered Stop   11/02/19 1100  cefTRIAXone (ROCEPHIN) 1 g in sodium chloride 0.9 % 100 mL IVPB  Status:  Discontinued        1 g 200 mL/hr over 30 Minutes Intravenous Every 24 hours 11/01/19 1523 11/02/19 1320   11/01/19 2200  rifaximin (XIFAXAN) tablet 550 mg     Discontinue     550 mg Oral 2 times daily 11/01/19 1801 11/15/19 2159   11/01/19 1415  rifaximin (XIFAXAN) tablet 200 mg        200 mg Oral  Once 11/01/19 1413 11/01/19 1552   11/01/19 1130  cefTRIAXone (ROCEPHIN) 1 g in sodium chloride 0.9 % 100 mL IVPB        1 g 200 mL/hr over 30 Minutes Intravenous  Once 11/01/19 1124 11/01/19 1336      PRN meds: sodium chloride, lactulose, LORazepam, ondansetron **OR** ondansetron (ZOFRAN) IV, sodium chloride flush   Objective: Vitals:   11/06/19 0730 11/06/19 1224  BP: (!) 95/64 105/75  Pulse: 97 95  Resp:    Temp: 99 F (37.2 C) 98.2 F (36.8 C)  SpO2: 100% 99%    Intake/Output Summary (Last 24 hours) at 11/06/2019 1228 Last data filed at 11/06/2019 0300 Gross per 24 hour  Intake 120 ml  Output 500 ml  Net -380 ml   Filed Weights   11/02/19 0609 11/03/19 0542 11/04/19 0307  Weight: 78.1 kg 76.9 kg 77.1 kg   Weight change:  Body mass index is 25.85 kg/m.   Physical Exam: General exam: Remains partially sedated Skin: No rashes, lesions or ulcers. HEENT: Atraumatic, normocephalic, supple neck, no obvious  bleeding Lungs: Clear to auscultation bilaterally CVS: Regular rate and rhythm, no murmur GI/Abd soft, mild epigastric tenderness, nondistended, bowel sound present CNS: partially sedated from the effect of Ativan Psychiatry: Unable to examine Extremities: No pedal edema, no calf tenderness  Data Review: I have personally reviewed the laboratory data and studies available.  Recent Labs  Lab 11/02/19 2306 11/03/19 0712 11/04/19 0532 11/05/19 0538 11/06/19 0508  WBC 10.1 10.8* 12.8* 17.9* 14.5*  NEUTROABS  --   --  10.7* 15.2* 11.5*  HGB 11.5* 11.1* 11.6* 11.3* 10.7*  HCT 33.2* 32.6* 33.9* 33.2* 31.7*  MCV 101.8* 102.5* 103.4* 103.4* 104.6*  PLT 84* 83* 103* 119* 107*   Recent Labs  Lab 11/01/19 1024 11/01/19 1601 11/01/19 1702 11/02/19 0111 11/03/19 0712 11/04/19 0532 11/05/19 0538 11/06/19 0508  NA   < >  --   --  140 138 139 136 136  K   < >  --   --  3.2* 2.7* 2.7* 3.0* 2.9*  CL   < >  --   --  109 106 108 104 105  CO2   < >  --   --  25 25 28 26 25   GLUCOSE   < >  --   --  95 134* 94 112* 80  BUN   < >  --   --  <5* <5* <5* <5* <5*  CREATININE   < >  --   --  <0.30* <0.30* <0.30* <0.30* <0.30*  CALCIUM   < >  --   --  7.3* 7.7* 8.0* 7.7* 7.8*  MG  --   --  2.0 2.0  --  1.6* 1.8  --   PHOS  --  UNABLE TO REPORT DUE TO ICTERUS. QSD  --  UNABLE TO REPORT DUE TO ICTERUS / JAG  --   UNABLE TO REPORT DUE TO ICTERIC INTERFERENCE SDR  UNABLE TO REPORT DUE TO ICTERIC INTERFERENCE  --    < > = values in this interval not displayed.    Signed, , MD [facility] Pager: [phone] (Secure Chat preferred). 11/06/2019

## 2019-11-07 DIAGNOSIS — K7290 Hepatic failure, unspecified without coma: Secondary | ICD-10-CM

## 2019-11-07 LAB — BASIC METABOLIC PANEL
Anion gap: 7 (ref 5–15)
BUN: <5 mg/dL — ABNORMAL LOW (ref 6–20)
CO2: 23 mmol/L (ref 22–32)
Calcium: 7.8 mg/dL — ABNORMAL LOW (ref 8.9–10.3)
Chloride: 104 mmol/L (ref 98–111)
Creatinine, Ser: <0.3 mg/dL — ABNORMAL LOW (ref 0.61–1.24)
Glucose, Bld: 107 mg/dL — ABNORMAL HIGH (ref 70–99)
Potassium: 3.5 mmol/L (ref 3.5–5.1)
Sodium: 134 mmol/L — ABNORMAL LOW (ref 135–145)

## 2019-11-07 LAB — CBC WITH DIFFERENTIAL/PLATELET
Abs Immature Granulocytes: 0.14 10*3/uL — ABNORMAL HIGH (ref 0.00–0.07)
Basophils Absolute: 0 10*3/uL (ref 0.0–0.1)
Basophils Relative: 0 %
Eosinophils Absolute: 0 10*3/uL (ref 0.0–0.5)
Eosinophils Relative: 0 %
HCT: 31.8 % — ABNORMAL LOW (ref 39.0–52.0)
Hemoglobin: 10.8 g/dL — ABNORMAL LOW (ref 13.0–17.0)
Immature Granulocytes: 1 %
Lymphocytes Relative: 8 %
Lymphs Abs: 1.1 10*3/uL (ref 0.7–4.0)
MCH: 35.8 pg — ABNORMAL HIGH (ref 26.0–34.0)
MCHC: 34 g/dL (ref 30.0–36.0)
MCV: 105.3 fL — ABNORMAL HIGH (ref 80.0–100.0)
Monocytes Absolute: 0.8 10*3/uL (ref 0.1–1.0)
Monocytes Relative: 6 %
Neutro Abs: 11.5 10*3/uL — ABNORMAL HIGH (ref 1.7–7.7)
Neutrophils Relative %: 85 %
Platelets: 116 10*3/uL — ABNORMAL LOW (ref 150–400)
RBC: 3.02 MIL/uL — ABNORMAL LOW (ref 4.22–5.81)
RDW: 15.5 % (ref 11.5–15.5)
WBC: 13.5 10*3/uL — ABNORMAL HIGH (ref 4.0–10.5)
nRBC: 0 % (ref 0.0–0.2)

## 2019-11-07 LAB — MAGNESIUM: Magnesium: 2.1 mg/dL (ref 1.7–2.4)

## 2019-11-07 NOTE — Progress Notes (Signed)
PROGRESS NOTE  Brett Washington  DOB: 1980-06-23  PCP: [facility] WER:[id number]  DOA: 11/01/2019  LOS: 6 days   Chief Complaint  Patient presents with  . Hemoptysis  . Dizziness   Brief narrative: 39 year old gentleman with chronic alcohol abuse, related frequent hospitalizations. Patient presented to the ED on 7/19 after 2 episodes of seizures at home as well as hematemesis. Patient was started on IV fluids, IV Protonix and octreotide and ceftriaxone, Keppra, CIWA protocol and admitted to the hospital.  He was drinking about 6-8 beers per day continuous for last 5 years.3 days prior to presentation, he started having nausea and vomiting and was just not able to consume any alcohol.  He subsequently went into withdrawal symptoms and presented to the ED.    In the ED, patient was mildly hypotensive and tachycardic responded to IV fluid resuscitation. Hemoglobin 13,  ALP 256/AST 164/ALT 32 and bilirubin of 21.   Hypokalemic, elevated lactic acid and leukocytosis.  Patient was admitted to hospitalist service. GI and palliative care consults were obtained.  Subjective: Patient was seen and examined this morning. Lying on bed.  Tries to open eyes on verbal command.  Remains altered. Not restless or agitated.  Does not seem to be physical distress.  Assessment/Plan: Hematemesis/upper GI bleeding:  -7/20, underwent EGD that showed erosive gastritis, no active bleeding.   -No evidence of further bleeding.  Hemoglobin has remained stable.  On oral Protonix.  Acute liver failure with alcoholic cirrhosis and alcoholic hepatitis Chronic thrombocytopenia, Lactic acidosis -Presented with Madrey discriminant function of 35, started on steroids.  Remains on prednisone 40 mg daily. -GI consult appreciated. -Remains on rifaximin, lactulose with aim to keep to have 2-3 bowel movements. -Initially on Rocephin, no evidence of SBP that is discontinued.  Chronic  alcoholism Alcohol withdrawal seizure: -All-time seizure precautions.  Fall precautions.  Delirium precautions. -Remains on high-dose thiamine and folic acid replacement. -Currently on Keppra IV 1 g twice daily.   -Continue CIWA scale with Ativan.  Secondary school teacher for safety. -Continue multivitamins with thiamine and folic acid -Continue Vitamin K for elevated INR.  Hypokalemia/ Hypomagnesemia -Potassium level was low at 2.9 yesterday.  IV and oral replacement given.  Level up to 3.5 today.  Repeat tomorrow. -Magnesium level normal at 2.1 today.  Poor prognosis -Family not at bedside.  Previous hospitalist address palliative care team have discussed the case with patient's family.  Patient's wife is aware of his poor prognosis and his family has lost hope on him.  Family still undecided about CODE STATUS and want to maintain full code for now.  Palliative care meet with family again on Monday.  Mobility: Encourage ambulation Code Status:   Code Status: Full Code  Nutritional status: Body mass index is 25.19 kg/m. Nutrition Problem: Inadequate oral intake Etiology: acute illness Signs/Symptoms: meal completion < 25% Diet Order            Diet 2 gram sodium Room service appropriate? Yes; Fluid consistency: Thin  Diet effective now                 DVT prophylaxis: SCDs Start: 11/01/19 1531   Antimicrobials:  None Fluid: None  Consultants: GI, palliative care Family Communication:  None at bedside  Status is: Inpatient  Remains inpatient appropriate because:Persistent severe electrolyte disturbances, Ongoing diagnostic testing needed not appropriate for outpatient work up and IV treatments appropriate due to intensity of illness or inability to take PO   Dispo:  Patient  From: Home  Planned Disposition: Home  Expected discharge date: 7/27  medically stable for discharge: No     Infusions:  . sodium chloride Stopped (11/02/19 0712)  . levETIRAcetam Stopped  (11/07/19 0532)    Scheduled Meds: . feeding supplement (ENSURE ENLIVE)  237 mL Oral TID BM  . gabapentin  300 mg Oral Daily  . LORazepam  0-4 mg Intravenous Q6H   Or  . LORazepam  0-4 mg Oral Q6H  . multivitamin with minerals  1 tablet Oral Daily  . pantoprazole  40 mg Oral BID  . predniSONE  40 mg Oral Q breakfast  . rifaximin  550 mg Oral BID    Antimicrobials: Anti-infectives (From admission, onward)   Start     Dose/Rate Route Frequency Ordered Stop   11/02/19 1100  cefTRIAXone (ROCEPHIN) 1 g in sodium chloride 0.9 % 100 mL IVPB  Status:  Discontinued        1 g 200 mL/hr over 30 Minutes Intravenous Every 24 hours 11/01/19 1523 11/02/19 1320   11/01/19 2200  rifaximin (XIFAXAN) tablet 550 mg     Discontinue     550 mg Oral 2 times daily 11/01/19 1801 11/15/19 2159   11/01/19 1415  rifaximin (XIFAXAN) tablet 200 mg        200 mg Oral  Once 11/01/19 1413 11/01/19 1552   11/01/19 1130  cefTRIAXone (ROCEPHIN) 1 g in sodium chloride 0.9 % 100 mL IVPB        1 g 200 mL/hr over 30 Minutes Intravenous  Once 11/01/19 1124 11/01/19 1336      PRN meds: sodium chloride, lactulose, LORazepam, ondansetron **OR** ondansetron (ZOFRAN) IV, sodium chloride flush   Objective: Vitals:   11/07/19 0512 11/07/19 1115  BP: (!) 91/62 (!) 87/55  Pulse: 68 84  Resp:  18  Temp: 98.9 F (37.2 C) 98.6 F (37 C)  SpO2: 99% 96%    Intake/Output Summary (Last 24 hours) at 11/07/2019 1203 Last data filed at 11/06/2019 1643 Gross per 24 hour  Intake 423.29 ml  Output 750 ml  Net -326.71 ml   Filed Weights   11/03/19 0542 11/04/19 0307 11/07/19 0300  Weight: 76.9 kg 77.1 kg 75.2 kg   Weight change:  Body mass index is 25.19 kg/m.   Physical Exam: General exam: Comfortable, not restless or agitated.  Remains altered Skin: No rashes, lesions or ulcers. HEENT: Atraumatic, normocephalic, supple neck, no obvious bleeding Lungs: Clear to auscultation bilaterally, no wheezing or  crackles. CVS: Regular rate and rhythm, no murmur GI/Abd soft, no abdominal tenderness, nondistended, bowel sound present CNS: Mental status remains altered. Psychiatry: Unable to examine Extremities: No pedal edema, no calf tenderness  Data Review: I have personally reviewed the laboratory data and studies available.  Recent Labs  Lab 11/03/19 0712 11/04/19 0532 11/05/19 0538 11/06/19 0508 11/07/19 0406  WBC 10.8* 12.8* 17.9* 14.5* 13.5*  NEUTROABS  --  10.7* 15.2* 11.5* 11.5*  HGB 11.1* 11.6* 11.3* 10.7* 10.8*  HCT 32.6* 33.9* 33.2* 31.7* 31.8*  MCV 102.5* 103.4* 103.4* 104.6* 105.3*  PLT 83* 103* 119* 107* 116*   Recent Labs  Lab 11/01/19 1024 11/01/19 1601 11/01/19 1702 11/02/19 0111 11/02/19 0111 11/03/19 0712 11/04/19 0532 11/05/19 0538 11/06/19 0508 11/07/19 0406  NA   < >  --   --  140   < > 138 139 136 136 134*  K   < >  --   --  3.2*   < > 2.7* 2.7*  3.0* 2.9* 3.5  CL   < >  --   --  109   < > 106 108 104 105 104  CO2   < >  --   --  25   < > 25 28 26 25 23   GLUCOSE   < >  --   --  95   < > 134* 94 112* 80 107*  BUN   < >  --   --  <5*   < > <5* <5* <5* <5* <5*  CREATININE   < >  --   --  <0.30*   < > <0.30* <0.30* <0.30* <0.30* <0.30*  CALCIUM   < >  --   --  7.3*   < > 7.7* 8.0* 7.7* 7.8* 7.8*  MG  --   --  2.0 2.0  --   --  1.6* 1.8  --  2.1  PHOS  --  UNABLE TO REPORT DUE TO ICTERUS. QSD  --  UNABLE TO REPORT DUE TO ICTERUS / JAG  --   --   UNABLE TO REPORT DUE TO ICTERIC INTERFERENCE SDR  UNABLE TO REPORT DUE TO ICTERIC INTERFERENCE  --   --    < > = values in this interval not displayed.    Signed, , MD [facility] Pager: [phone] (Secure Chat preferred). 11/07/2019

## 2019-11-07 NOTE — Progress Notes (Signed)
Brett Washington , MD [address], [place], [place], [address] [address], [address], [place], [place], [address] Phone: [phone]  Fax: [phone] a  Brett Washington is being followed for alcoholic hepatitis, liver failure   Subjective: Drowsy no complaints    Objective: Vital signs in last 24 hours: Vitals:   11/06/19 1644 11/06/19 1943 11/07/19 0300 11/07/19 0512  BP: 99/67 103/68  (!) 91/62  Pulse: 81 84  68  Resp:  16    Temp: 98.4 F (36.9 C) 99.1 F (37.3 C)  98.9 F (37.2 C)  TempSrc: Oral Oral  Oral  SpO2: 98% 99%  99%  Weight:   75.2 kg   Height:       Weight change:   Intake/Output Summary (Last 24 hours) at 11/07/2019 1028 Last data filed at 11/06/2019 1643 Gross per 24 hour  Intake 423.29 ml  Output 750 ml  Net -326.71 ml     Exam: Heart:: Regular rate and rhythm, S1S2 present or without murmur or extra heart sounds Lungs: normal, clear to auscultation and clear to auscultation and percussion Abdomen: soft, nontender, normal bowel sounds   Lab Results: @LABTEST2 @ Micro Results: Recent Results (from the past 240 hour(s))  SARS Coronavirus 2 by RT PCR (hospital order, performed in [facility] hospital lab) Nasopharyngeal Nasopharyngeal Swab     Status: None   Collection Time: 11/01/19 11:45 AM   Specimen: Nasopharyngeal Swab  Result Value Ref Range Status   SARS Coronavirus 2 NEGATIVE NEGATIVE Final    Comment: (NOTE) SARS-CoV-2 target nucleic acids are NOT DETECTED.  The SARS-CoV-2 RNA is generally detectable in upper and lower respiratory specimens during the acute phase of infection. The lowest concentration of SARS-CoV-2 viral copies this assay can detect is 250 copies / mL. A negative result does not preclude SARS-CoV-2 infection and should not be used as the sole basis for treatment or other patient management decisions.  A negative result may occur with improper specimen collection / handling, submission of specimen  other than nasopharyngeal swab, presence of viral mutation(s) within the areas targeted by this assay, and inadequate number of viral copies (<250 copies / mL). A negative result must be combined with clinical observations, patient history, and epidemiological information.  Fact Sheet for Patients:   11/03/19  Fact Sheet for Healthcare Providers: BoilerBrush.com.cy  This test is not yet approved or  cleared by the https://pope.com/ FDA and has been authorized for detection and/or diagnosis of SARS-CoV-2 by FDA under an Emergency Use Authorization (EUA).  This EUA will remain in effect (meaning this test can be used) for the duration of the COVID-19 declaration under Section 564(b)(1) of the Act, 21 U.S.C. section 360bbb-3(b)(1), unless the authorization is terminated or revoked sooner.  Performed at [facility], [address] [place], [place]   CULTURE, BLOOD (ROUTINE X 2) w Reflex to ID Panel     Status: None   Collection Time: 11/01/19  5:02 PM   Specimen: BLOOD  Result Value Ref Range Status   Specimen Description BLOOD BLOOD RIGHT HAND  Final   Special Requests   Final    BOTTLES DRAWN AEROBIC ONLY Blood Culture adequate volume   Culture   Final    NO GROWTH 5 DAYS Performed at [facility], [address] [place], [place]    Report Status 11/06/2019 FINAL  Final  CULTURE, BLOOD (ROUTINE X 2) w Reflex to ID Panel     Status:  None   Collection Time: 11/01/19 10:21 PM   Specimen: Right Antecubital; Blood  Result Value Ref Range Status   Specimen Description RIGHT ANTECUBITAL  Final   Special Requests   Final    BOTTLES DRAWN AEROBIC AND ANAEROBIC Blood Culture adequate volume   Culture   Final    NO GROWTH 5 DAYS Performed at [facility], [address], [place], [place] [address]    Report Status 11/06/2019 FINAL  Final   Studies/Results: No results  found. Medications: I have reviewed the patient's current medications. Scheduled Meds: . feeding supplement (ENSURE ENLIVE)  237 mL Oral TID BM  . folic acid  2 mg Oral Daily  . gabapentin  300 mg Oral Daily  . LORazepam  0-4 mg Intravenous Q6H   Or  . LORazepam  0-4 mg Oral Q6H  . multivitamin with minerals  1 tablet Oral Daily  . pantoprazole  40 mg Oral BID  . predniSONE  40 mg Oral Q breakfast  . rifaximin  550 mg Oral BID   Continuous Infusions: . sodium chloride Stopped (11/02/19 0712)  . levETIRAcetam Stopped (11/07/19 0532)   PRN Meds:.sodium chloride, lactulose, LORazepam, ondansetron **OR** ondansetron (ZOFRAN) IV, sodium chloride flush   Assessment: Principal Problem:   Hematemesis Active Problems:   Thrombocytopenia (HCC)   Hypokalemia   Acute hepatic encephalopathy   Leukocytosis   Abnormal LFTs   Seizure (HCC)   Alcohol abuse   Goals of care, counseling/discussion   Palliative care by specialist   Brett Washington 39 y.o. male with severe alcoholic hepatitis and liver failure . Presented also with hematemesis. S/p EGD on 7/20201- no evidence of bleeding. Commenced on steroids on 11/02/2019, vitamin K for elevated INR .Hb stable, awaiting CMP from today,   Plan: 1. Continue good nutrition, alcohol abstinence, supportive care,Continue Steroids- will need Lillie score calculate tomorrow to decide on continuation of steroids.   2. Overall poor prognosis.   3. Continue xifaxan and lactulose for hepatic encephelopathy    LOS: 6 days   Brett Mood, MD 11/07/2019, 10:28 AM

## 2019-11-08 DIAGNOSIS — Z7189 Other specified counseling: Secondary | ICD-10-CM

## 2019-11-08 DIAGNOSIS — Z789 Other specified health status: Secondary | ICD-10-CM

## 2019-11-08 DIAGNOSIS — R17 Unspecified jaundice: Secondary | ICD-10-CM

## 2019-11-08 DIAGNOSIS — K7010 Alcoholic hepatitis without ascites: Secondary | ICD-10-CM

## 2019-11-08 LAB — CBC WITH DIFFERENTIAL/PLATELET
Abs Immature Granulocytes: 0.18 10*3/uL — ABNORMAL HIGH (ref 0.00–0.07)
Basophils Absolute: 0 10*3/uL (ref 0.0–0.1)
Basophils Relative: 0 %
Eosinophils Absolute: 0 10*3/uL (ref 0.0–0.5)
Eosinophils Relative: 0 %
HCT: 30.9 % — ABNORMAL LOW (ref 39.0–52.0)
Hemoglobin: 10.9 g/dL — ABNORMAL LOW (ref 13.0–17.0)
Immature Granulocytes: 1 %
Lymphocytes Relative: 7 %
Lymphs Abs: 1 10*3/uL (ref 0.7–4.0)
MCH: 36 pg — ABNORMAL HIGH (ref 26.0–34.0)
MCHC: 35.3 g/dL (ref 30.0–36.0)
MCV: 102 fL — ABNORMAL HIGH (ref 80.0–100.0)
Monocytes Absolute: 1.3 10*3/uL — ABNORMAL HIGH (ref 0.1–1.0)
Monocytes Relative: 9 %
Neutro Abs: 12.4 10*3/uL — ABNORMAL HIGH (ref 1.7–7.7)
Neutrophils Relative %: 83 %
Platelets: 150 10*3/uL (ref 150–400)
RBC: 3.03 MIL/uL — ABNORMAL LOW (ref 4.22–5.81)
RDW: 15.5 % (ref 11.5–15.5)
WBC: 14.9 10*3/uL — ABNORMAL HIGH (ref 4.0–10.5)
nRBC: 0 % (ref 0.0–0.2)

## 2019-11-08 LAB — COMPREHENSIVE METABOLIC PANEL
ALT: 42 U/L (ref 0–44)
AST: 80 U/L — ABNORMAL HIGH (ref 15–41)
Albumin: 1.5 g/dL — ABNORMAL LOW (ref 3.5–5.0)
Alkaline Phosphatase: 155 U/L — ABNORMAL HIGH (ref 38–126)
Anion gap: 4 — ABNORMAL LOW (ref 5–15)
BUN: 6 mg/dL (ref 6–20)
CO2: 26 mmol/L (ref 22–32)
Calcium: 7.8 mg/dL — ABNORMAL LOW (ref 8.9–10.3)
Chloride: 106 mmol/L (ref 98–111)
Creatinine, Ser: <0.3 mg/dL — ABNORMAL LOW (ref 0.61–1.24)
Glucose, Bld: 116 mg/dL — ABNORMAL HIGH (ref 70–99)
Potassium: 3.7 mmol/L (ref 3.5–5.1)
Sodium: 136 mmol/L (ref 135–145)
Total Bilirubin: 18.5 mg/dL (ref 0.3–1.2)
Total Protein: 5.8 g/dL — ABNORMAL LOW (ref 6.5–8.1)

## 2019-11-08 LAB — AMMONIA: Ammonia: 86 umol/L — ABNORMAL HIGH (ref 9–35)

## 2019-11-08 MED ORDER — LEVETIRACETAM 500 MG PO TABS
1000.0000 mg | ORAL_TABLET | Freq: Two times a day (BID) | ORAL | Status: DC
  Administered 2019-11-08 – 2019-11-09 (×3): 1000 mg via ORAL
  Filled 2019-11-08 (×3): qty 2

## 2019-11-08 NOTE — Progress Notes (Signed)
CH attempted visit per OR for prayer and potential assistance w/AD completion; pt. asleep.  CH will pass along referral to evening chaplain.

## 2019-11-08 NOTE — Progress Notes (Signed)
[facility] Liaison note:  New referral for [employer] community Palliative program to follow at home received from [facility]. Patient information sent to referral. Thank you for this referral. Dayna Barker BSN, RN, [facility] Liaison [facility] [phone]

## 2019-11-08 NOTE — Progress Notes (Signed)
Daily Progress Note   Patient Name: Brett Washington       Date: 11/08/2019 DOB: 11/02/1980  Age: 39 y.o. MRN#: [id number] Attending Physician: Dorcas Carrow, MD Primary Care Physician: [facility] Admit Date: 11/01/2019   Reason for Consultation/Follow-up: Disposition and Establishing goals of care  Subjective: Chart review performed. Report received from primary RN. RN had no acute concerns.  Went to visit patient at bedside - no family present. Patient was up to the bathroom with RN assistance. He was awake, alert, oriented, and able to participate in GOC conversation. He declined use of interpreter.   Once he was back in bed, discussed his current medical situation and illness. He has a clear understanding of how acutely ill he was. He states that he feels better today.   He is hopeful to discharge home when medically stable - he will go to his mothers house. He stated that his mother took time off work to be able to stay with him for a while post discharge to help care for him.   Conversation was had around the importance of continued ETOH abstinence upon discharge - patient became tearful. Emotional support was provided. Offered to connect him with community resources to support him with sobriety - he was very interested in this. Also offered outpatient Palliative Care support when discharged - he was also agreeable and appreciative.   Offered chaplain services for emotional support - he was agreeable.   Patient wants his mother/Brett Washington to be his medical decision maker. Offered to have Chaplain complete HCPOA.  Concepts specific to code status were considered and discussed. Mr. [name] wanted time to think further about this decision. He is  agreeable to full code status at this time.   Length of Stay: 7  Current Medications: Scheduled Meds:   feeding supplement (ENSURE ENLIVE)  237 mL Oral TID BM   gabapentin  300 mg Oral Daily   levETIRAcetam  1,000 mg Oral BID   multivitamin with minerals  1 tablet Oral Daily   pantoprazole  40 mg Oral BID   predniSONE  40 mg Oral Q breakfast   rifaximin  550 mg Oral BID    Continuous Infusions:  sodium chloride Stopped (11/02/19 0712)    PRN Meds: sodium chloride, lactulose, ondansetron **OR** ondansetron (ZOFRAN) IV,  sodium chloride flush  Physical Exam Constitutional:      General: He is not in acute distress. Pulmonary:     Effort: No respiratory distress.  Skin:    General: Skin is warm and dry.  Neurological:     Mental Status: He is alert and oriented to person, place, and time.  Psychiatric:        Mood and Affect: Mood normal. Affect is tearful.        Thought Content: Thought content normal.             Vital Signs: BP 102/67 (BP Location: Left Arm)    Pulse 86    Temp 98.1 F (36.7 C)    Resp 17    Ht 5\' 8"  (1.727 m)    Wt 75.3 kg    SpO2 99%    BMI 25.24 kg/m  SpO2: SpO2: 99 % O2 Device: O2 Device: Room Air O2 Flow Rate:    Intake/output summary:   Intake/Output Summary (Last 24 hours) at 11/08/2019 1456 Last data filed at 11/08/2019 0600 Gross per 24 hour  Intake 240 ml  Output 1050 ml  Net -810 ml   LBM: Last BM Date: 11/08/19 Baseline Weight: Weight: 80 kg Most recent weight: Weight: 75.3 kg       Palliative Assessment/Data: PPS 70%    Flowsheet Rows     Most Recent Value  Intake Tab  Referral Department Hospitalist  Unit at Time of Referral Intermediate Care Unit  Palliative Care Primary Diagnosis Other (Comment)  [alcoholic hepatitis]  Date Notified 11/04/19  Palliative Care Type New Palliative care  Reason for referral Clarify Goals of Care  Date of Admission 11/01/19  Date first seen by Palliative Care 11/05/19  # of  days Palliative referral response time 1 Day(s)  # of days IP prior to Palliative referral 3  Clinical Assessment  Palliative Performance Scale Score 60%  Psychosocial & Spiritual Assessment  Palliative Care Outcomes  Patient/Family meeting held? Yes  Who was at the meeting? wife  Palliative Care Outcomes Clarified goals of care, Provided psychosocial or spiritual support      Patient Active Problem List   Diagnosis Date Noted   Goals of care, counseling/discussion    Palliative care by specialist    Hematemesis 11/01/2019   Acute hepatic encephalopathy 11/01/2019   Leukocytosis 11/01/2019   Abnormal LFTs 11/01/2019   Seizure (HCC) 11/01/2019   Alcohol abuse 11/01/2019   Hepatic encephalopathy (HCC)    Hypothermia 06/02/2019   Hypothermia due to cold environment 06/02/2019   Alcohol intoxication with delirium (HCC) 06/02/2019   COVID-19 virus infection 06/02/2019   LOC (loss of consciousness) (HCC) 01/11/2017   Alcohol use disorder, severe, dependence (HCC) 11/06/2016   Aspiration pneumonia due to vomit (HCC)    Alcohol withdrawal seizure (HCC) 10/31/2016   Thrombocytopenia (HCC) 10/31/2016   Transaminitis 10/31/2016   Hypokalemia 10/31/2016    Palliative Care Assessment & Plan   Patient Profile: 39 y.o. male  with past medical history of ETOH abuse, seizures, and frequent hospitalizations r/t ETOH  admitted on 11/01/2019 with seizures, confusion, and hematemesis. Underwent EGD that showed erosive gastritis. Patient with acute liver failure with alcoholic cirrhosis and alcoholic hepatitis. PMT consulted for GOC.  Assessment: Upper GIB Acute hepatic encephalopathy Abnormal LFTs Alcohol withdrawal seizure Alcohol abuse Acute liver failure Alcoholic cirrhosis Alcoholic hepatitis Hypokalemia  Recommendations/Plan:  Continue current medical treatment  Continue full code status  Patient's goal is to discharge to his mothers house  when medically  stable  Patient wanted information for community resources to support with his sobriety - TOC consult placed  Recommend outpatient Palliative Care when discharged - TOC order placed  Patient wants his mother/Brett Brett Washington to be his medical decision maker  Chaplain consulted for emotional support and to possibly complete HCPOA paperwork if patient decides he wants to have that done   PMT will continue to follow peripherally, if there are any immanent needs please call the service directly  Goals of Care and Additional Recommendations:  Limitations on Scope of Treatment: Full Scope Treatment  Code Status:    Code Status Orders  (From admission, onward)         Start     Ordered   11/01/19 1532  Full code  Continuous        11/01/19 1532        Code Status History    Date Active Date Inactive Code Status Order ID Comments User Context   11/01/2019 1054 11/01/2019 1532 Full Code [id number]  Willy Eddy, MD ED   06/02/2019 1641 06/04/2019 1535 Full Code [id number]  Eddie North, MD ED   06/01/2019 1445 06/01/2019 2134 Full Code [id number]  Sharman Cheek, MD ED   01/12/2017 0019 01/12/2017 1346 Full Code [id number]  Gery Pray, MD Inpatient   10/31/2016 1542 11/11/2016 1926 Full Code [id number]  Oralia Manis, MD ED   Advance Care Planning Activity       Prognosis:   Unable to determine  Discharge Planning:  Home with Palliative Services  Care plan was discussed with primary RN, TOC, Dr. Jerral Ralph  Thank you for allowing the Palliative Medicine Team to assist in the care of this patient.   Time In: 1000 Time Out: 1035 Total Time 35 minutes Prolonged Time Billed  no       Greater than 50%  of this time was spent counseling and coordinating care related to the above assessment and plan.  Haskel Khan, NP  Please contact Palliative Medicine Team phone at [phone] for questions and concerns.

## 2019-11-08 NOTE — Progress Notes (Signed)
Melodie Bouillon, MD [address], [address], [place], [place], [address] [address], [address], [place], [place], [address] Phone: [phone]  Fax: [phone]   Subjective: Patient laying in bed, conversive.  Denies any abdominal pain.  No episodes of bleeding.   Objective: Exam: Vital signs in last 24 hours: Vitals:   11/07/19 1519 11/07/19 2024 11/08/19 0514 11/08/19 0716  BP: 101/74 104/73 98/66 99/69   Pulse: 85 83 80 79  Resp: 19 18 16 16   Temp: 98.9 F (37.2 C) 99.1 F (37.3 C) 98 F (36.7 C) 98.6 F (37 C)  TempSrc: Oral Oral Oral   SpO2: 99% 99% 96% 98%  Weight:   75.3 kg   Height:       Weight change: 0.139 kg  Intake/Output Summary (Last 24 hours) at 11/08/2019 1143 Last data filed at 11/08/2019 0600 Gross per 24 hour  Intake 240 ml  Output 1050 ml  Net -810 ml    General: No acute distress, AAO x3 Abd: Soft, NT/ND, No HSM Skin: Warm, no rashes Neck: Supple, Trachea midline   Lab Results: Lab Results  Component Value Date   WBC 14.9 (H) 11/08/2019   HGB 10.9 (L) 11/08/2019   HCT 30.9 (L) 11/08/2019   MCV 102.0 (H) 11/08/2019   PLT 150 11/08/2019   Micro Results: Recent Results (from the past 240 hour(s))  SARS Coronavirus 2 by RT PCR (hospital order, performed in [facility] hospital lab) Nasopharyngeal Nasopharyngeal Swab     Status: None   Collection Time: 11/01/19 11:45 AM   Specimen: Nasopharyngeal Swab  Result Value Ref Range Status   SARS Coronavirus 2 NEGATIVE NEGATIVE Final    Comment: (NOTE) SARS-CoV-2 target nucleic acids are NOT DETECTED.  The SARS-CoV-2 RNA is generally detectable in upper and lower respiratory specimens during the acute phase of infection. The lowest concentration of SARS-CoV-2 viral copies this assay can detect is 250 copies / mL. A negative result does not preclude SARS-CoV-2 infection and should not be used as the sole basis for treatment or other patient management decisions.  A negative result  may occur with improper specimen collection / handling, submission of specimen other than nasopharyngeal swab, presence of viral mutation(s) within the areas targeted by this assay, and inadequate number of viral copies (<250 copies / mL). A negative result must be combined with clinical observations, patient history, and epidemiological information.  Fact Sheet for Patients:   11/03/19  Fact Sheet for Healthcare Providers: BoilerBrush.com.cy  This test is not yet approved or  cleared by the https://pope.com/ FDA and has been authorized for detection and/or diagnosis of SARS-CoV-2 by FDA under an Emergency Use Authorization (EUA).  This EUA will remain in effect (meaning this test can be used) for the duration of the COVID-19 declaration under Section 564(b)(1) of the Act, 21 U.S.C. section 360bbb-3(b)(1), unless the authorization is terminated or revoked sooner.  Performed at [facility], [address] [place], [place]   CULTURE, BLOOD (ROUTINE X 2) w Reflex to ID Panel     Status: None   Collection Time: 11/01/19  5:02 PM   Specimen: BLOOD  Result Value Ref Range Status   Specimen Description BLOOD BLOOD RIGHT HAND  Final   Special Requests   Final    BOTTLES DRAWN AEROBIC ONLY Blood Culture adequate volume   Culture   Final    NO GROWTH 5 DAYS Performed at [facility], [address] [place], [place]  Report Status 11/06/2019 FINAL  Final  CULTURE, BLOOD (ROUTINE X 2) w Reflex to ID Panel     Status: None   Collection Time: 11/01/19 10:21 PM   Specimen: Right Antecubital; Blood  Result Value Ref Range Status   Specimen Description RIGHT ANTECUBITAL  Final   Special Requests   Final    BOTTLES DRAWN AEROBIC AND ANAEROBIC Blood Culture adequate volume   Culture   Final    NO GROWTH 5 DAYS Performed at [facility], [address], [place], [place] [address]     Report Status 11/06/2019 FINAL  Final   Studies/Results: No results found. Medications:  Scheduled Meds: . feeding supplement (ENSURE ENLIVE)  237 mL Oral TID BM  . gabapentin  300 mg Oral Daily  . levETIRAcetam  1,000 mg Oral BID  . LORazepam  0-4 mg Intravenous Q6H   Or  . LORazepam  0-4 mg Oral Q6H  . multivitamin with minerals  1 tablet Oral Daily  . pantoprazole  40 mg Oral BID  . predniSONE  40 mg Oral Q breakfast  . rifaximin  550 mg Oral BID   Continuous Infusions: . sodium chloride Stopped (11/02/19 0712)   PRN Meds:.sodium chloride, lactulose, LORazepam, ondansetron **OR** ondansetron (ZOFRAN) IV, sodium chloride flush   Assessment: Principal Problem:   Hematemesis Active Problems:   Thrombocytopenia (HCC)   Hypokalemia   Acute hepatic encephalopathy   Leukocytosis   Abnormal LFTs   Seizure (HCC)   Alcohol abuse   Goals of care, counseling/discussion   Palliative care by specialist    Plan: Patient was started on steroids on 11/02/2019.  Day 7 Lillie score calculation would be tomorrow, 11/09/2019  However, patient has had improvement in bilirubin with bilirubin being 18.5 today, and 20 yesterday  Continue daily CMP Continue to avoid hepatotoxic drugs Continue to encourage nutrition as tolerated   LOS: 7 days   Melodie Bouillon, MD 11/08/2019, 11:43 AM

## 2019-11-08 NOTE — Progress Notes (Signed)
PROGRESS NOTE    Brett Washington  YBO:[id number] DOB: 02-01-1981 DOA: 11/01/2019 PCP: [facility]    Brief Narrative:  39 year old gentleman with alcohol abuse, frequent hospitalization related to alcoholism presented to the emergency room with 2 episodes of seizures at home and confusion, hematemesis. In the emergency room, mildly hypotensive and tachycardic responded to IV fluid resuscitation.  Hemoglobin 13. ALP 256/AST 164/ALT 32 and bilirubin of 21.  Hypokalemic, elevated lactic acid and leukocytosis. Patient was started on IV fluids, IV Protonix and octreotide and ceftriaxone, Keppra, CIWA protocol and admitted to the hospital.  He has been drinking about 6-8 beers per day continuous for last 5 years, unable to drink for the last 3 days due to nausea and vomiting resulting in withdrawals. 7/23, remained with alcohol withdrawal, impulsive and agitated as well as advance liver failure.   Assessment & Plan:   Principal Problem:   Hematemesis Active Problems:   Thrombocytopenia (HCC)   Hypokalemia   Acute hepatic encephalopathy   Leukocytosis   Abnormal LFTs   Seizure (HCC)   Alcohol abuse   Goals of care, counseling/discussion   Palliative care by specialist  Hematemesis/upper GI bleeding: Underwent EGD that showed erosive gastritis, no active bleeding.  Hemoglobin has remained stable.  On oral Protonix .  No evidence of bleeding.  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  Lactic acidosis.  Macrocytic anemia. Presented with [facility] discriminant function of 35, started on steroids. No evidence of infection. Currently remains on, Prednisone 40 mg daily with plan for prolonged taper.   Multivitamins with thiamine and folic acid Vitamin K x3, finished therapy Appreciate GI recommendations Poor prognosis. Lactulose PRN to make 2-3 BM daily.  Alcoholism/alcohol withdrawal seizure: All-time seizure  precautions.  Fall precautions.  Delirium precautions. Remains on thiamine and folic acid replacement. Start IV Keppra, will change to oral Keppra today.   Initially on Rocephin, no evidence of SBP that is discontinued. Remains on rifaximin, lactulose with aim to keep to have 2-3 bowel movements. Day 7 hospitalization.  Discontinue benzodiazepines and monitor.  Hypokalemia: Replaced aggressively with improvement now.    Change to oral medications.  Ambulate in the hallway.  Will monitor today in the hospital.  If continues to improve, may expect to discharge soon.  DVT prophylaxis: SCDs Start: 11/01/19 1531   Code Status: Full code Family Communication: Wife meeting with palliative care today. Disposition Plan: Status is: Inpatient  Remains inpatient appropriate because:IV treatments appropriate due to intensity of illness or inability to take PO and Inpatient level of care appropriate due to severity of illness   Dispo: The patient is from: Home              Anticipated d/c is to: Home              Anticipated d/c date is: 2 days              Patient currently is not medically stable to d/c.         Consultants:   Gastroenterology  Palliative care.  Procedures:   None  Antimicrobials:  Anti-infectives (From admission, onward)   Start     Dose/Rate Route Frequency Ordered Stop   11/02/19 1100  cefTRIAXone (ROCEPHIN) 1 g in sodium chloride 0.9 % 100 mL IVPB  Status:  Discontinued        1 g 200 mL/hr over 30 Minutes Intravenous Every 24 hours 11/01/19 1523 11/02/19 1320   11/01/19  2200  rifaximin (XIFAXAN) tablet 550 mg     Discontinue     550 mg Oral 2 times daily 11/01/19 1801 11/15/19 2159   11/01/19 1415  rifaximin (XIFAXAN) tablet 200 mg        200 mg Oral  Once 11/01/19 1413 11/01/19 1552   11/01/19 1130  cefTRIAXone (ROCEPHIN) 1 g in sodium chloride 0.9 % 100 mL IVPB        1 g 200 mL/hr over 30 Minutes Intravenous  Once 11/01/19 1124 11/01/19 1336           Subjective: Patient seen and examined.  No overnight events.  His breakfast tray was empty and he was able to eat everything.  Patient himself did not offer any complaints.  He denied any nausea vomiting or abdominal pain.  He was mostly oriented and he knows his liver is messed up. Objective: Vitals:   11/07/19 2024 11/08/19 0514 11/08/19 0716 11/08/19 1201  BP: 104/73 98/66 99/69  102/67  Pulse: 83 80 79 86  Resp: 18 16 16 17   Temp: 99.1 F (37.3 C) 98 F (36.7 C) 98.6 F (37 C) 98.1 F (36.7 C)  TempSrc: Oral Oral    SpO2: 99% 96% 98% 99%  Weight:  75.3 kg    Height:        Intake/Output Summary (Last 24 hours) at 11/08/2019 1321 Last data filed at 11/08/2019 0600 Gross per 24 hour  Intake 240 ml  Output 1050 ml  Net -810 ml   Filed Weights   11/04/19 0307 11/07/19 0300 11/08/19 0514  Weight: 77.1 kg 75.2 kg 75.3 kg    Examination:  General exam: Sick looking but comfortable.  Not in distress.  Icteric. Respiratory system: Clear to auscultation. Respiratory effort normal. No added sounds  Cardiovascular system: S1 & S2 heard, RRR.  Gastrointestinal system: Abdomen is nondistended, soft and nontender. No organomegaly or masses felt. Normal bowel sounds heard. Central nervous system: Alert and oriented x3.  Some impulsiveness.  No focal neurological deficits.   Data Reviewed: I have personally reviewed following labs and imaging studies  CBC: Recent Labs  Lab 11/04/19 0532 11/05/19 0538 11/06/19 0508 11/07/19 0406 11/08/19 0427  WBC 12.8* 17.9* 14.5* 13.5* 14.9*  NEUTROABS 10.7* 15.2* 11.5* 11.5* 12.4*  HGB 11.6* 11.3* 10.7* 10.8* 10.9*  HCT 33.9* 33.2* 31.7* 31.8* 30.9*  MCV 103.4* 103.4* 104.6* 105.3* 102.0*  PLT 103* 119* 107* 116* 150   Basic Metabolic Panel: Recent Labs  Lab 11/01/19 1601 11/01/19 1702 11/02/19 0111 11/03/19 16100712 11/04/19 0532 11/05/19 0538 11/06/19 0508 11/07/19 0406 11/08/19 0427  NA  --   --  140   < > 139 136 136 134*  136  K  --   --  3.2*   < > 2.7* 3.0* 2.9* 3.5 3.7  CL  --   --  109   < > 108 104 105 104 106  CO2  --   --  25   < > 28 26 25 23 26   GLUCOSE  --   --  95   < > 94 112* 80 107* 116*  BUN  --   --  <5*   < > <5* <5* <5* <5* 6  CREATININE  --   --  <0.30*   < > <0.30* <0.30* <0.30* <0.30* <0.30*  CALCIUM  --   --  7.3*   < > 8.0* 7.7* 7.8* 7.8* 7.8*  MG  --  2.0 2.0  --  1.6* 1.8  --  2.1  --   PHOS UNABLE TO REPORT DUE TO ICTERUS. QSD  --  UNABLE TO REPORT DUE TO ICTERUS / JAG  --   UNABLE TO REPORT DUE TO ICTERIC INTERFERENCE SDR  UNABLE TO REPORT DUE TO ICTERIC INTERFERENCE  --   --   --    < > = values in this interval not displayed.   GFR: CrCl cannot be calculated (This lab value cannot be used to calculate CrCl because it is not a number: <0.30). Liver Function Tests: Recent Labs  Lab 11/03/19 0712 11/04/19 0532 11/05/19 0538 11/06/19 0508 11/08/19 0427  AST 93* 105* 105* 104* 80*  ALT 23 30 32 34 42  ALKPHOS 180* 183* 160* 159* 155*  BILITOT 16.8* 19.1* 20.5* 20.0* 18.5*  PROT 5.9* 6.5 6.2* 5.8* 5.8*  ALBUMIN 1.7* 1.9* 1.8* 1.6* 1.5*   No results for input(s): LIPASE, AMYLASE in the last 168 hours. Recent Labs  Lab 11/03/19 0712 11/05/19 0538 11/06/19 0508 11/08/19 0428  AMMONIA 109* 94* 72* 86*   Coagulation Profile: Recent Labs  Lab 11/03/19 1511 11/04/19 0532 11/05/19 0538  INR 2.0* 2.0* 2.1*   Cardiac Enzymes: No results for input(s): CKTOTAL, CKMB, CKMBINDEX, TROPONINI in the last 168 hours. BNP (last 3 results) No results for input(s): PROBNP in the last 8760 hours. HbA1C: No results for input(s): HGBA1C in the last 72 hours. CBG: No results for input(s): GLUCAP in the last 168 hours. Lipid Profile: No results for input(s): CHOL, HDL, LDLCALC, TRIG, CHOLHDL, LDLDIRECT in the last 72 hours. Thyroid Function Tests: No results for input(s): TSH, T4TOTAL, FREET4, T3FREE, THYROIDAB in the last 72 hours. Anemia Panel: No results for input(s):  VITAMINB12, FOLATE, FERRITIN, TIBC, IRON, RETICCTPCT in the last 72 hours. Sepsis Labs: Recent Labs  Lab 11/01/19 1601 11/01/19 1702 11/02/19 0714 11/02/19 1104 11/02/19 1553 11/02/19 1831  PROCALCITON 0.25  --   --   --   --   --   LATICACIDVEN  --    < > 2.3* 2.5* 1.8 1.5   < > = values in this interval not displayed.    Recent Results (from the past 240 hour(s))  SARS Coronavirus 2 by RT PCR (hospital order, performed in [facility] hospital lab) Nasopharyngeal Nasopharyngeal Swab     Status: None   Collection Time: 11/01/19 11:45 AM   Specimen: Nasopharyngeal Swab  Result Value Ref Range Status   SARS Coronavirus 2 NEGATIVE NEGATIVE Final    Comment: (NOTE) SARS-CoV-2 target nucleic acids are NOT DETECTED.  The SARS-CoV-2 RNA is generally detectable in upper and lower respiratory specimens during the acute phase of infection. The lowest concentration of SARS-CoV-2 viral copies this assay can detect is 250 copies / mL. A negative result does not preclude SARS-CoV-2 infection and should not be used as the sole basis for treatment or other patient management decisions.  A negative result may occur with improper specimen collection / handling, submission of specimen other than nasopharyngeal swab, presence of viral mutation(s) within the areas targeted by this assay, and inadequate number of viral copies (<250 copies / mL). A negative result must be combined with clinical observations, patient history, and epidemiological information.  Fact Sheet for Patients:   BoilerBrush.com.cy  Fact Sheet for Healthcare Providers: https://pope.com/  This test is not yet approved or  cleared by the [place] FDA and has been authorized for detection and/or diagnosis of SARS-CoV-2 by FDA under an Emergency Use Authorization (EUA).  This EUA will remain in effect (meaning this test can be used) for the duration of the COVID-19  declaration under Section 564(b)(1) of the Act, 21 U.S.C. section 360bbb-3(b)(1), unless the authorization is terminated or revoked sooner.  Performed at [facility], [address] [place], [place] [address]   CULTURE, BLOOD (ROUTINE X 2) w Reflex to ID Panel     Status: None   Collection Time: 11/01/19  5:02 PM   Specimen: BLOOD  Result Value Ref Range Status   Specimen Description BLOOD BLOOD RIGHT HAND  Final   Special Requests   Final    BOTTLES DRAWN AEROBIC ONLY Blood Culture adequate volume   Culture   Final    NO GROWTH 5 DAYS Performed at [facility], [address] [place], [place] [address]    Report Status 11/06/2019 FINAL  Final  CULTURE, BLOOD (ROUTINE X 2) w Reflex to ID Panel     Status: None   Collection Time: 11/01/19 10:21 PM   Specimen: Right Antecubital; Blood  Result Value Ref Range Status   Specimen Description RIGHT ANTECUBITAL  Final   Special Requests   Final    BOTTLES DRAWN AEROBIC AND ANAEROBIC Blood Culture adequate volume   Culture   Final    NO GROWTH 5 DAYS Performed at [facility], [address], [place], [place] [address]    Report Status 11/06/2019 FINAL  Final         Radiology Studies: No results found.      Scheduled Meds: . feeding supplement (ENSURE ENLIVE)  237 mL Oral TID BM  . gabapentin  300 mg Oral Daily  . levETIRAcetam  1,000 mg Oral BID  . multivitamin with minerals  1 tablet Oral Daily  . pantoprazole  40 mg Oral BID  . predniSONE  40 mg Oral Q breakfast  . rifaximin  550 mg Oral BID   Continuous Infusions: . sodium chloride Stopped (11/02/19 0712)     LOS: 7 days    Time spent: 30 minutes    Dorcas Carrow, MD [facility] Pager [phone]

## 2019-11-08 NOTE — Clinical Social Work Note (Addendum)
CSW went to bedside to provide pt with SA resources. Pt was asleep and did not wake to CSW voice. CSW left resources at beside. RN notified. Also outpatient palliative referral provided to Clydie Braun with [facility]. [place], [place] [phone]

## 2019-11-09 LAB — CBC WITH DIFFERENTIAL/PLATELET
Abs Immature Granulocytes: 0.16 10*3/uL — ABNORMAL HIGH (ref 0.00–0.07)
Basophils Absolute: 0.1 10*3/uL (ref 0.0–0.1)
Basophils Relative: 0 %
Eosinophils Absolute: 0.1 10*3/uL (ref 0.0–0.5)
Eosinophils Relative: 1 %
HCT: 33.8 % — ABNORMAL LOW (ref 39.0–52.0)
Hemoglobin: 11.5 g/dL — ABNORMAL LOW (ref 13.0–17.0)
Immature Granulocytes: 1 %
Lymphocytes Relative: 9 %
Lymphs Abs: 1.1 10*3/uL (ref 0.7–4.0)
MCH: 36.2 pg — ABNORMAL HIGH (ref 26.0–34.0)
MCHC: 34 g/dL (ref 30.0–36.0)
MCV: 106.3 fL — ABNORMAL HIGH (ref 80.0–100.0)
Monocytes Absolute: 1.4 10*3/uL — ABNORMAL HIGH (ref 0.1–1.0)
Monocytes Relative: 11 %
Neutro Abs: 9.7 10*3/uL — ABNORMAL HIGH (ref 1.7–7.7)
Neutrophils Relative %: 78 %
Platelets: 179 10*3/uL (ref 150–400)
RBC: 3.18 MIL/uL — ABNORMAL LOW (ref 4.22–5.81)
RDW: 15 % (ref 11.5–15.5)
WBC: 12.5 10*3/uL — ABNORMAL HIGH (ref 4.0–10.5)
nRBC: 0 % (ref 0.0–0.2)

## 2019-11-09 LAB — COMPREHENSIVE METABOLIC PANEL
ALT: 44 U/L (ref 0–44)
AST: 83 U/L — ABNORMAL HIGH (ref 15–41)
Albumin: 1.6 g/dL — ABNORMAL LOW (ref 3.5–5.0)
Alkaline Phosphatase: 166 U/L — ABNORMAL HIGH (ref 38–126)
Anion gap: 8 (ref 5–15)
BUN: 6 mg/dL (ref 6–20)
CO2: 24 mmol/L (ref 22–32)
Calcium: 8 mg/dL — ABNORMAL LOW (ref 8.9–10.3)
Chloride: 104 mmol/L (ref 98–111)
Creatinine, Ser: <0.3 mg/dL — ABNORMAL LOW (ref 0.61–1.24)
Glucose, Bld: 91 mg/dL (ref 70–99)
Potassium: 3.3 mmol/L — ABNORMAL LOW (ref 3.5–5.1)
Sodium: 136 mmol/L (ref 135–145)
Total Bilirubin: 19.2 mg/dL (ref 0.3–1.2)
Total Protein: 6.2 g/dL — ABNORMAL LOW (ref 6.5–8.1)

## 2019-11-09 LAB — AMMONIA: Ammonia: 66 umol/L — ABNORMAL HIGH (ref 9–35)

## 2019-11-09 MED ORDER — GABAPENTIN 600 MG PO TABS: 300.0000 mg | ORAL_TABLET | Freq: Every day | ORAL | 0 refills | Status: DC

## 2019-11-09 MED ORDER — ADULT MULTIVITAMIN W/MINERALS CH: 1.0000 | ORAL_TABLET | Freq: Every day | ORAL | 0 refills | Status: AC

## 2019-11-09 MED ORDER — KETOROLAC TROMETHAMINE 30 MG/ML IJ SOLN
30.0000 mg | Freq: Once | INTRAMUSCULAR | Status: AC
  Administered 2019-11-09: 30 mg via INTRAVENOUS
  Filled 2019-11-09: qty 1

## 2019-11-09 MED ORDER — LEVETIRACETAM 1000 MG PO TABS: 1000.0000 mg | ORAL_TABLET | Freq: Two times a day (BID) | ORAL | 0 refills | Status: DC

## 2019-11-09 MED ORDER — POTASSIUM CHLORIDE CRYS ER 20 MEQ PO TBCR: 20.0000 meq | EXTENDED_RELEASE_TABLET | Freq: Every day | ORAL | 0 refills | Status: DC

## 2019-11-09 MED ORDER — PANTOPRAZOLE SODIUM 40 MG PO TBEC: 40.0000 mg | DELAYED_RELEASE_TABLET | Freq: Two times a day (BID) | ORAL | 0 refills | Status: DC

## 2019-11-09 MED ORDER — PREDNISONE 20 MG PO TABS: 40.0000 mg | ORAL_TABLET | Freq: Every day | ORAL | 0 refills | Status: AC

## 2019-11-09 MED ORDER — ALUM & MAG HYDROXIDE-SIMETH 200-200-20 MG/5ML PO SUSP
30.0000 mL | ORAL | Status: DC | PRN
  Administered 2019-11-09: 30 mL via ORAL
  Filled 2019-11-09: qty 30

## 2019-11-09 MED ORDER — POTASSIUM CHLORIDE CRYS ER 20 MEQ PO TBCR
40.0000 meq | EXTENDED_RELEASE_TABLET | Freq: Two times a day (BID) | ORAL | Status: DC
  Administered 2019-11-09: 40 meq via ORAL
  Filled 2019-11-09: qty 2

## 2019-11-09 MED ORDER — RIFAXIMIN 550 MG PO TABS: 550.0000 mg | ORAL_TABLET | Freq: Two times a day (BID) | ORAL | 0 refills | Status: DC

## 2019-11-09 MED ORDER — LACTULOSE 10 GM/15ML PO SOLN: 20.0000 g | Freq: Two times a day (BID) | ORAL | 0 refills | Status: DC | PRN

## 2019-11-09 NOTE — Progress Notes (Signed)
Discharge instructions reviewed with patient. Opportunity for questions made available. IV & telemetry removed from pt per protocol. Pt waiting for sister to transport home.

## 2019-11-09 NOTE — Progress Notes (Signed)
Melodie Bouillon, MD [address], [place], [place], [address] [address], [place], [place], [address] Phone: [phone]  Fax: [phone]   Subjective: Patient denies any abdominal pain, nausea or vomiting.  No confusion.   Objective: Exam: Vital signs in last 24 hours: Vitals:   11/08/19 2000 11/09/19 0315 11/09/19 0713 11/09/19 1118  BP: 107/68 115/73 95/69 (!) 93/59  Pulse: 83 88 82 84  Resp: 19 19 18 18   Temp: 98.4 F (36.9 C) 97.6 F (36.4 C) 98.5 F (36.9 C) 98.7 F (37.1 C)  TempSrc: Oral Oral Oral Oral  SpO2: 99% 100% 98% 99%  Weight:  74.6 kg    Height:       Weight change: -0.729 kg  Intake/Output Summary (Last 24 hours) at 11/09/2019 1515 Last data filed at 11/09/2019 1000 Gross per 24 hour  Intake 1200 ml  Output 450 ml  Net 750 ml    General: No acute distress, AAO x3 Abd: Soft, NT/ND, No HSM Skin: Warm, no rashes Neck: Supple, Trachea midline   Lab Results: Lab Results  Component Value Date   WBC 12.5 (H) 11/09/2019   HGB 11.5 (L) 11/09/2019   HCT 33.8 (L) 11/09/2019   MCV 106.3 (H) 11/09/2019   PLT 179 11/09/2019   Micro Results: Recent Results (from the past 240 hour(s))  SARS Coronavirus 2 by RT PCR (hospital order, performed in [facility] hospital lab) Nasopharyngeal Nasopharyngeal Swab     Status: None   Collection Time: 11/01/19 11:45 AM   Specimen: Nasopharyngeal Swab  Result Value Ref Range Status   SARS Coronavirus 2 NEGATIVE NEGATIVE Final    Comment: (NOTE) SARS-CoV-2 target nucleic acids are NOT DETECTED.  The SARS-CoV-2 RNA is generally detectable in upper and lower respiratory specimens during the acute phase of infection. The lowest concentration of SARS-CoV-2 viral copies this assay can detect is 250 copies / mL. A negative result does not preclude SARS-CoV-2 infection and should not be used as the sole basis for treatment or other patient management decisions.  A negative result may  occur with improper specimen collection / handling, submission of specimen other than nasopharyngeal swab, presence of viral mutation(s) within the areas targeted by this assay, and inadequate number of viral copies (<250 copies / mL). A negative result must be combined with clinical observations, patient history, and epidemiological information.  Fact Sheet for Patients:   11/03/19  Fact Sheet for Healthcare Providers: BoilerBrush.com.cy  This test is not yet approved or  cleared by the https://pope.com/ FDA and has been authorized for detection and/or diagnosis of SARS-CoV-2 by FDA under an Emergency Use Authorization (EUA).  This EUA will remain in effect (meaning this test can be used) for the duration of the COVID-19 declaration under Section 564(b)(1) of the Act, 21 U.S.C. section 360bbb-3(b)(1), unless the authorization is terminated or revoked sooner.  Performed at [facility], [address] [place], [place]   CULTURE, BLOOD (ROUTINE X 2) w Reflex to ID Panel     Status: None   Collection Time: 11/01/19  5:02 PM   Specimen: BLOOD  Result Value Ref Range Status   Specimen Description BLOOD BLOOD RIGHT HAND  Final   Special Requests   Final    BOTTLES DRAWN AEROBIC ONLY Blood Culture adequate volume   Culture   Final    NO GROWTH 5 DAYS Performed at [facility], [address] [place], [place]    Report  Status 11/06/2019 FINAL  Final  CULTURE, BLOOD (ROUTINE X 2) w Reflex to ID Panel     Status: None   Collection Time: 11/01/19 10:21 PM   Specimen: Right Antecubital; Blood  Result Value Ref Range Status   Specimen Description RIGHT ANTECUBITAL  Final   Special Requests   Final    BOTTLES DRAWN AEROBIC AND ANAEROBIC Blood Culture adequate volume   Culture   Final    NO GROWTH 5 DAYS Performed at [facility], [address], [place], [place] [address]     Report Status 11/06/2019 FINAL  Final   Studies/Results: No results found. Medications:  Scheduled Meds: . feeding supplement (ENSURE ENLIVE)  237 mL Oral TID BM  . gabapentin  300 mg Oral Daily  . levETIRAcetam  1,000 mg Oral BID  . multivitamin with minerals  1 tablet Oral Daily  . pantoprazole  40 mg Oral BID  . potassium chloride  40 mEq Oral BID  . predniSONE  40 mg Oral Q breakfast  . rifaximin  550 mg Oral BID   Continuous Infusions: . sodium chloride Stopped (11/02/19 0712)   PRN Meds:.sodium chloride, alum & mag hydroxide-simeth, lactulose, ondansetron **OR** ondansetron (ZOFRAN) IV, sodium chloride flush   Assessment: Principal Problem:   Hematemesis Active Problems:   Thrombocytopenia (HCC)   Hypokalemia   Acute hepatic encephalopathy   Leukocytosis   Abnormal LFTs   Seizure (HCC)   Alcohol abuse   Goals of care, counseling/discussion   Palliative care by specialist   Elevated bilirubin   Full code status   Advanced directives, counseling/discussion    Plan: Lillie score today, day 7 is less than 0.45 indicating improvement.  Given the above, continue steroids for a total of 28 days, after which it should be tapered  Continue daily CMP   Continue good oral nutrition  Avoid hepatotoxic drugs  Encourage abstinence  Patient to follow-up with PCP closely after discharge and in GI clinic within 1 to 2 weeks of discharge.  Please set up appointment at the time of discharge  GI service will sign off at this time, please page with any questions or concerns   LOS: 8 days   Melodie Bouillon, MD 11/09/2019, 3:15 PM

## 2019-11-09 NOTE — Progress Notes (Signed)
Nutrition Follow Up Note   DOCUMENTATION CODES:   Not applicable  INTERVENTION:   Ensure Enlive po TID, each supplement provides 350 kcal and 20 grams of protein  Recommend MVI, thiamine and folic acid daily in setting of etoh abuse  Pt likely at high refeed risk; recommend monitor K, Mg and P labs daily as oral intake improves.   NUTRITION DIAGNOSIS:   Inadequate oral intake related to acute illness as evidenced by meal completion < 25%.  GOAL:   Patient will meet greater than or equal to 90% of their needs  -progressing   MONITOR:   PO intake, Supplement acceptance, Labs, Weight trends, Skin, I & O's  ASSESSMENT:   39 y.o. male with medical history significant of alcohol abuse, alcohol withdrawal seizure, thrombocytopenia, COVID-19 infection who presents with metaphyses, altered mental status and seizure.   Pt s/p EGD 7/20; pt found to have portal hypertensive gastropathy, LA Grade A reflux, acute and erosive esophagitis with no bleeding and small hiatal hernia.  Pt with improved appetite and oral intake; pt eating 100% of meals and drinking some Ensure supplements. Pt is at high refeed risk; phosphorus unable to be checked r/t icterus. Per chart, pt down 12lbs(7%) since admit. Palliative care following for [place]; pt currently wanting full scope. Recommend continue vitamins and supplements after discharge.   Medications reviewed and include: MVI, protonix, KCl, prednisone  Labs reviewed: K 3.3(L), BUN 6 wnl, creat <0.30(L), alk phos 166(H), AST 83(H), tbili 19.2(H) Mg 2.1 wnl- 7/25 Wbc- 12.5(H)  Diet Order:   Diet Order            Diet 2 gram sodium Room service appropriate? Yes; Fluid consistency: Thin  Diet effective now                EDUCATION NEEDS:   Not appropriate for education at this time  Skin:  Skin Assessment: Reviewed RN Assessment  Last BM:  7/27  Height:   Ht Readings from Last 1 Encounters:  11/04/19 '5\' 8"'  (1.727 m)    Weight:   Wt  Readings from Last 1 Encounters:  11/09/19 74.6 kg    Ideal Body Weight:  70 kg  BMI:  Body mass index is 25 kg/m.  Estimated Nutritional Needs:   Kcal:  2000-2300kcal/day  Protein:  100-115g/day  Fluid:  >2.1L/day  Koleen Distance MS, RD, LDN Please refer to [facility] for RD and/or RD on-call/weekend/after hours pager

## 2019-11-09 NOTE — Discharge Summary (Signed)
Physician Discharge Summary  Brett [place] SEG:[id number] DOB: 06-02-1980 DOA: 11/01/2019  PCP: [facility]  Admit date: 11/01/2019 Discharge date: 11/09/2019  Admitted From: Home Disposition: Home  Recommendations for Outpatient Follow-up:  1. Follow up with PCP in 1-2 weeks 2. Please obtain CMP/CBC in 1 week 3. Follow-up with gastroenterology, GI service to schedule follow-up.  Discharge Condition: Fair CODE STATUS: Full code Diet recommendation: Low-sodium diet  Discharge summary: 39 year old gentleman with alcohol abuse, frequent hospitalization related to alcoholism presented to the emergency room with 2 episodes of seizures at home and confusion, hematemesis. In the emergency room, mildly hypotensive and tachycardic responded to IV fluid resuscitation.  Hemoglobin 13. ALP 256/AST 164/ALT 32 and bilirubin of 21.  Hypokalemic, elevated lactic acid and leukocytosis. Patient was started on IV fluids, IV Protonix and octreotide and ceftriaxone, Keppra, CIWA protocol and admitted to the hospital.  He has been drinking about 6-8 beers per day continuous for last 5 years, unable to drink for the last 3 days due to nausea and vomiting resulting in withdrawals.  Patient was admitted to hospital and treated for acute alcoholic hepatitis, upper GI bleeding and alcohol withdrawal with delirium tremens.  #1 . hematemesis/upper GI bleeding: Underwent EGD that showed erosive gastritis, no active bleeding.  Hemoglobin has remained stable.  On oral Protonix .  No evidence of bleeding.  Discharging with Protonix 40 mg twice a day.  #2. 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  Lactic acidosis.  Macrocytic anemia. Presented with  [facility] discriminant function of 35, started on steroids. No evidence of infection. Responded well to systemic steroid therapy.  Treated with vitamin K and multivitamins. As per GI recommendation,  discharging home with high-dose steroid, prescribed 1 month of prednisone 40 mg daily, outpatient GI follow-up and slow taper. Patient is adequately improved, however due to advanced liver disease has overall poor prognosis.  #3 alcoholism/alcohol withdrawal seizure: Developed significant alcohol withdrawal, agitation and confusion while in the hospital.  Treated with high-dose multivitamins and benzodiazepines.  No use of benzodiazepine for last 48 hours.  Adequately improved and out of withdrawal window. Rifaximin daily Lactulose with aim to make 2-3 loose bowel movements a day. Multivitamins. Potassium supplements. Started on Keppra with improvement, will continue Keppra 1 g twice a day until outpatient follow-up.  Patient is adequately stabilized now.  Has advanced liver disease, good response to systemic steroid for his alcoholic hepatitis.  His alcohol withdrawal has improved.  He is mobilized around.  He is counseled extensively and is agreeable to quit drinking alcohol.  Discussed with family and they are able to take care of him at home.   Discharge Diagnoses:  Principal Problem:   Hematemesis Active Problems:   Thrombocytopenia (HCC)   Hypokalemia   Acute hepatic encephalopathy   Leukocytosis   Abnormal LFTs   Seizure (HCC)   Alcohol abuse   Goals of care, counseling/discussion   Palliative care by specialist   Elevated bilirubin   Full code status   Advanced directives, counseling/discussion    Discharge Instructions  Discharge Instructions    Diet - low sodium heart healthy   Complete by: As directed    Discharge instructions   Complete by: As directed    Do not drink alcohol Do not you stop taking her medicine until seen by Dr.   Jaquita Folds activity slowly   Complete by: As directed      Allergies as of 11/09/2019   No Known Allergies  Medication List    TAKE these medications   gabapentin 600 MG tablet Commonly known as: NEURONTIN Take 0.5 tablets  (300 mg total) by mouth at bedtime.   lactulose 10 GM/15ML solution Commonly known as: CHRONULAC Take 30 mLs (20 g total) by mouth 2 (two) times daily as needed for mild constipation (to make 2-3 loose BM a day).   levETIRAcetam 1000 MG tablet Commonly known as: KEPPRA Take 1 tablet (1,000 mg total) by mouth 2 (two) times daily.   multivitamin with minerals Tabs tablet Take 1 tablet by mouth daily. Start taking on: November 10, 2019   pantoprazole 40 MG tablet Commonly known as: PROTONIX Take 1 tablet (40 mg total) by mouth 2 (two) times daily.   potassium chloride SA 20 MEQ tablet Commonly known as: KLOR-CON Take 1 tablet (20 mEq total) by mouth daily for 7 days.   predniSONE 20 MG tablet Commonly known as: DELTASONE Take 2 tablets (40 mg total) by mouth daily with breakfast. Start taking on: November 10, 2019   rifaximin 550 MG Tabs tablet Commonly known as: XIFAXAN Take 1 tablet (550 mg total) by mouth 2 (two) times daily.       Follow-up Information    [facility]. Schedule an appointment as soon as possible for a visit in 2 week(s).   Specialty: General Practice Contact information: [address] [place] [address] [phone]        [facility]. Schedule an appointment as soon as possible for a visit in 2 week(s).   Specialty: Gastroenterology Contact information: [address] [place] [address] [phone]             No Known Allergies  Consultations:  Gastroenterology  Palliative medicine   Procedures/Studies: CT HEAD WO CONTRAST  Result Date: 11/01/2019 CLINICAL DATA:  Syncopal episodes EXAM: CT HEAD WITHOUT CONTRAST TECHNIQUE: Contiguous axial images were obtained from the base of the skull through the vertex without intravenous contrast. COMPARISON:  06/02/2019 FINDINGS: Brain: There is no acute intracranial hemorrhage, mass effect, or edema. Gray-white  differentiation is preserved. There is no extra-axial fluid collection. There is unchanged slight prominence of the ventricles and sulci compatible with minor parenchymal volume loss greater than expected for age. Vascular: No hyperdense vessel or unexpected calcification. Skull: Calvarium is unremarkable. Sinuses/Orbits: No acute finding. Other: None. IMPRESSION: No acute intracranial abnormality. Electronically Signed   By: Guadlupe Spanish M.D.   On: 11/01/2019 14:47   CT ABDOMEN PELVIS W CONTRAST  Result Date: 11/01/2019 CLINICAL DATA:  Nausea, vomiting. EXAM: CT ABDOMEN AND PELVIS WITH CONTRAST TECHNIQUE: Multidetector CT imaging of the abdomen and pelvis was performed using the standard protocol following bolus administration of intravenous contrast. CONTRAST:  OMNIPAQUE IOHEXOL 300 MG/ML  SOLN COMPARISON:  None. FINDINGS: Lower chest: No acute abnormality. Hepatobiliary: No gallstones or biliary dilatation is noted. Hepatic steatosis is noted. Hepatomegaly is noted. Pancreas: Unremarkable. No pancreatic ductal dilatation or surrounding inflammatory changes. Spleen: Moderate splenomegaly is noted. Adrenals/Urinary Tract: Adrenal glands are unremarkable. Kidneys are normal, without renal calculi, focal lesion, or hydronephrosis. Bladder is unremarkable. Stomach/Bowel: Stomach is within normal limits. Appendix appears normal. No evidence of bowel wall thickening, distention, or inflammatory changes. Vascular/Lymphatic: The aorta is unremarkable. No adenopathy is noted. There also appears to be a large collateral vein extending from the confluence of the splenic and and superior mesenteric veins to the left renal vein. The portal vein does appear to be patent, although  these findings are consistent with portal hypertension. Reproductive: Prostate is unremarkable. Other: No abdominal wall hernia or abnormality. No abdominopelvic ascites. Musculoskeletal: No acute or significant osseous findings.  IMPRESSION: 1. Hepatic steatosis with hepatomegaly. 2. Moderate splenomegaly is noted with large collateral vein extending from the confluence of the splenic and superior mesenteric veins to the left renal vein. The portal vein does appear to be patent, although these findings are consistent with portal hypertension. Electronically Signed   By: Lupita Raider M.D.   On: 11/01/2019 14:49   DG Chest Portable 1 View  Result Date: 11/01/2019 CLINICAL DATA:  Hemoptysis EXAM: PORTABLE CHEST 1 VIEW COMPARISON:  06/02/2019 FINDINGS: The heart size and mediastinal contours are within normal limits. Both lungs are clear. The visualized skeletal structures are unremarkable. IMPRESSION: No acute abnormality of the lungs in AP portable projection. Electronically Signed   By: Lauralyn Primes M.D.   On: 11/01/2019 11:30   US ABDOMEN LIMITED RUQ  Result Date: 11/01/2019 CLINICAL DATA:  Elevated bilirubin EXAM: ULTRASOUND ABDOMEN LIMITED RIGHT UPPER QUADRANT COMPARISON:  None. FINDINGS: Gallbladder: Sludge is present. No gallstones or wall thickening visualized. No sonographic Murphy sign noted by sonographer. Common bile duct: Diameter: 4 mm, normal Liver: No focal lesion identified. Parenchymal hyperechogenicity and heterogeneity. Portal vein is patent on color Doppler imaging with abnormal retrograde direction of blood flow away from the liver. Other: None. IMPRESSION: Gallbladder sludge. Increased liver echogenicity with heterogeneous appearance, which may reflect chronic liver disease or steatosis. The portal vein is patent but with reversal of flow away from the liver. Electronically Signed   By: Guadlupe Spanish M.D.   On: 11/01/2019 12:34   (Echo, Carotid, EGD, Colonoscopy, ERCP)    Subjective: Patient was seen and examined.  No overnight events.  Patient tells me that he has no nausea vomiting.  He was observed eating all his breakfast in the morning.  Had some abdominal discomfort in the morning improved.  2 bowel  movements overnight.    Discharge Exam: Vitals:   11/09/19 1118 11/09/19 1517  BP: (!) 93/59 104/66  Pulse: 84 77  Resp: 18 18  Temp: 98.7 F (37.1 C) 98.8 F (37.1 C)  SpO2: 99% 98%   Vitals:   11/09/19 0315 11/09/19 0713 11/09/19 1118 11/09/19 1517  BP: 115/73 95/69 (!) 93/59 104/66  Pulse: 88 82 84 77  Resp: 19 18 18 18   Temp: 97.6 F (36.4 C) 98.5 F (36.9 C) 98.7 F (37.1 C) 98.8 F (37.1 C)  TempSrc: Oral Oral Oral Oral  SpO2: 100% 98% 99% 98%  Weight: 74.6 kg     Height:        General: Pt is alert, awake, not in acute distress Chronically sick looking.  Icteric.  Not in any distress.  No tremors.  Is mostly alert oriented x4. Cardiovascular: RRR, S1/S2 +, no rubs, no gallops Respiratory: CTA bilaterally, no wheezing, no rhonchi Abdominal: Soft, NT, ND, bowel sounds + Extremities: no edema, no cyanosis    The results of significant diagnostics from this hospitalization (including imaging, microbiology, ancillary and laboratory) are listed below for reference.     Microbiology: Recent Results (from the past 240 hour(s))  SARS Coronavirus 2 by RT PCR (hospital order, performed in [facility] hospital lab) Nasopharyngeal Nasopharyngeal Swab     Status: None   Collection Time: 11/01/19 11:45 AM   Specimen: Nasopharyngeal Swab  Result Value Ref Range Status   SARS Coronavirus 2 NEGATIVE NEGATIVE Final  Comment: (NOTE) SARS-CoV-2 target nucleic acids are NOT DETECTED.  The SARS-CoV-2 RNA is generally detectable in upper and lower respiratory specimens during the acute phase of infection. The lowest concentration of SARS-CoV-2 viral copies this assay can detect is 250 copies / mL. A negative result does not preclude SARS-CoV-2 infection and should not be used as the sole basis for treatment or other patient management decisions.  A negative result may occur with improper specimen collection / handling, submission of specimen other than nasopharyngeal  swab, presence of viral mutation(s) within the areas targeted by this assay, and inadequate number of viral copies (<250 copies / mL). A negative result must be combined with clinical observations, patient history, and epidemiological information.  Fact Sheet for Patients:   BoilerBrush.com.cyhttps://www.fda.gov/media/136312/download  Fact Sheet for Healthcare Providers: https://pope.com/https://www.fda.gov/media/136313/download  This test is not yet approved or  cleared by the [place] FDA and has been authorized for detection and/or diagnosis of SARS-CoV-2 by FDA under an Emergency Use Authorization (EUA).  This EUA will remain in effect (meaning this test can be used) for the duration of the COVID-19 declaration under Section 564(b)(1) of the Act, 21 U.S.C. section 360bbb-3(b)(1), unless the authorization is terminated or revoked sooner.  Performed at [facility], [address] [place], [place] [address]   CULTURE, BLOOD (ROUTINE X 2) w Reflex to ID Panel     Status: None   Collection Time: 11/01/19  5:02 PM   Specimen: BLOOD  Result Value Ref Range Status   Specimen Description BLOOD BLOOD RIGHT HAND  Final   Special Requests   Final    BOTTLES DRAWN AEROBIC ONLY Blood Culture adequate volume   Culture   Final    NO GROWTH 5 DAYS Performed at [facility], [address] [place], [place] [address]    Report Status 11/06/2019 FINAL  Final  CULTURE, BLOOD (ROUTINE X 2) w Reflex to ID Panel     Status: None   Collection Time: 11/01/19 10:21 PM   Specimen: Right Antecubital; Blood  Result Value Ref Range Status   Specimen Description RIGHT ANTECUBITAL  Final   Special Requests   Final    BOTTLES DRAWN AEROBIC AND ANAEROBIC Blood Culture adequate volume   Culture   Final    NO GROWTH 5 DAYS Performed at [facility], [address] [place], [place] [address]    Report Status 11/06/2019 FINAL  Final     Labs: BNP (last 3 results) No results for input(s): BNP in  the last 8760 hours. Basic Metabolic Panel: Recent Labs  Lab 11/04/19 0532 11/04/19 0532 11/05/19 [id number] 11/06/19 0508 11/07/19 0406 11/08/19 0427 11/09/19 0600  NA 139   < > 136 136 134* 136 136  K 2.7*   < > 3.0* 2.9* 3.5 3.7 3.3*  CL 108   < > 104 105 104 106 104  CO2 28   < > 26 25 23 26 24   GLUCOSE 94   < > 112* 80 107* 116* 91  BUN <5*   < > <5* <5* <5* 6 6  CREATININE <0.30*   < > <0.30* <0.30* <0.30* <0.30* <0.30*  CALCIUM 8.0*   < > 7.7* 7.8* 7.8* 7.8* 8.0*  MG 1.6*  --  1.8  --  2.1  --   --   PHOS  UNABLE TO REPORT DUE TO ICTERIC INTERFERENCE SDR  --   UNABLE TO REPORT DUE TO ICTERIC INTERFERENCE  --   --   --   --    < > =  values in this interval not displayed.   Liver Function Tests: Recent Labs  Lab 11/04/19 0532 11/05/19 0538 11/06/19 0508 11/08/19 0427 11/09/19 0600  AST 105* 105* 104* 80* 83*  ALT 30 32 34 42 44  ALKPHOS 183* 160* 159* 155* 166*  BILITOT 19.1* 20.5* 20.0* 18.5* 19.2*  PROT 6.5 6.2* 5.8* 5.8* 6.2*  ALBUMIN 1.9* 1.8* 1.6* 1.5* 1.6*   No results for input(s): LIPASE, AMYLASE in the last 168 hours. Recent Labs  Lab 11/03/19 0712 11/05/19 0538 11/06/19 0508 11/08/19 0428 11/09/19 0600  AMMONIA 109* 94* 72* 86* 66*   CBC: Recent Labs  Lab 11/05/19 0538 11/06/19 0508 11/07/19 0406 11/08/19 0427 11/09/19 0600  WBC 17.9* 14.5* 13.5* 14.9* 12.5*  NEUTROABS 15.2* 11.5* 11.5* 12.4* 9.7*  HGB 11.3* 10.7* 10.8* 10.9* 11.5*  HCT 33.2* 31.7* 31.8* 30.9* 33.8*  MCV 103.4* 104.6* 105.3* 102.0* 106.3*  PLT 119* 107* 116* 150 179   Cardiac Enzymes: No results for input(s): CKTOTAL, CKMB, CKMBINDEX, TROPONINI in the last 168 hours. BNP: Invalid input(s): POCBNP CBG: No results for input(s): GLUCAP in the last 168 hours. D-Dimer No results for input(s): DDIMER in the last 72 hours. Hgb A1c No results for input(s): HGBA1C in the last 72 hours. Lipid Profile No results for input(s): CHOL, HDL, LDLCALC, TRIG, CHOLHDL, LDLDIRECT in the  last 72 hours. Thyroid function studies No results for input(s): TSH, T4TOTAL, T3FREE, THYROIDAB in the last 72 hours.  Invalid input(s): FREET3 Anemia work up No results for input(s): VITAMINB12, FOLATE, FERRITIN, TIBC, IRON, RETICCTPCT in the last 72 hours. Urinalysis    Component Value Date/Time   COLORURINE AMBER (A) 11/01/2019 2025   APPEARANCEUR CLOUDY (A) 11/01/2019 2025   APPEARANCEUR Clear 11/11/2013 1122   LABSPEC 1.045 (H) 11/01/2019 2025   LABSPEC 1.001 11/11/2013 1122   PHURINE 6.0 11/01/2019 2025   GLUCOSEU NEGATIVE 11/01/2019 2025   GLUCOSEU Negative 11/11/2013 1122   HGBUR NEGATIVE 11/01/2019 2025   BILIRUBINUR MODERATE (A) 11/01/2019 2025   BILIRUBINUR Negative 11/11/2013 1122   KETONESUR 5 (A) 11/01/2019 2025   PROTEINUR NEGATIVE 11/01/2019 2025   NITRITE NEGATIVE 11/01/2019 2025   LEUKOCYTESUR NEGATIVE 11/01/2019 2025   LEUKOCYTESUR Negative 11/11/2013 1122   Sepsis Labs Invalid input(s): PROCALCITONIN,  WBC,  LACTICIDVEN Microbiology Recent Results (from the past 240 hour(s))  SARS Coronavirus 2 by RT PCR (hospital order, performed in [facility] hospital lab) Nasopharyngeal Nasopharyngeal Swab     Status: None   Collection Time: 11/01/19 11:45 AM   Specimen: Nasopharyngeal Swab  Result Value Ref Range Status   SARS Coronavirus 2 NEGATIVE NEGATIVE Final    Comment: (NOTE) SARS-CoV-2 target nucleic acids are NOT DETECTED.  The SARS-CoV-2 RNA is generally detectable in upper and lower respiratory specimens during the acute phase of infection. The lowest concentration of SARS-CoV-2 viral copies this assay can detect is 250 copies / mL. A negative result does not preclude SARS-CoV-2 infection and should not be used as the sole basis for treatment or other patient management decisions.  A negative result may occur with improper specimen collection / handling, submission of specimen other than nasopharyngeal swab, presence of viral mutation(s) within  the areas targeted by this assay, and inadequate number of viral copies (<250 copies / mL). A negative result must be combined with clinical observations, patient history, and epidemiological information.  Fact Sheet for Patients:   BoilerBrush.com.cy  Fact Sheet for Healthcare Providers: https://pope.com/  This test is not yet approved or  cleared by  the [employer] and has been authorized for detection and/or diagnosis of SARS-CoV-2 by FDA under an Emergency Use Authorization (EUA).  This EUA will remain in effect (meaning this test can be used) for the duration of the COVID-19 declaration under Section 564(b)(1) of the Act, 21 U.S.C. section 360bbb-3(b)(1), unless the authorization is terminated or revoked sooner.  Performed at [facility], [address] [place], [place] [address]   CULTURE, BLOOD (ROUTINE X 2) w Reflex to ID Panel     Status: None   Collection Time: 11/01/19  5:02 PM   Specimen: BLOOD  Result Value Ref Range Status   Specimen Description BLOOD BLOOD RIGHT HAND  Final   Special Requests   Final    BOTTLES DRAWN AEROBIC ONLY Blood Culture adequate volume   Culture   Final    NO GROWTH 5 DAYS Performed at [facility], [address] [place], [place] [address]    Report Status 11/06/2019 FINAL  Final  CULTURE, BLOOD (ROUTINE X 2) w Reflex to ID Panel     Status: None   Collection Time: 11/01/19 10:21 PM   Specimen: Right Antecubital; Blood  Result Value Ref Range Status   Specimen Description RIGHT ANTECUBITAL  Final   Special Requests   Final    BOTTLES DRAWN AEROBIC AND ANAEROBIC Blood Culture adequate volume   Culture   Final    NO GROWTH 5 DAYS Performed at [facility], [address] [place], [place] [address]    Report Status 11/06/2019 FINAL  Final     Time coordinating discharge: 40 minutes  SIGNED:   Dorcas Carrow, MD  [facility] 11/09/2019, 3:55 PM

## 2019-11-09 NOTE — Plan of Care (Signed)
  Problem: Education: Goal: Knowledge of General Education information will improve Description: Including pain rating scale, medication(s)/side effects and non-pharmacologic comfort measures Outcome: Progressing   Problem: Health Behavior/Discharge Planning: Goal: Ability to manage health-related needs will improve Outcome: Progressing   Problem: Clinical Measurements: Goal: Diagnostic test results will improve Outcome: Progressing   

## 2019-11-09 NOTE — Progress Notes (Signed)
CH attempted visit per OR for prayer yesterday; pt. requested to use bathroom just as CH walked in --> [facility] plans to follow up after pt. has gone to bathroom if possible.    11/09/19 1530  Clinical Encounter Type  Visited With Patient  Visit Type Follow-up

## 2019-11-15 ENCOUNTER — Telehealth: Payer: Self-pay | Admitting: Primary Care

## 2019-11-15 NOTE — Telephone Encounter (Signed)
Called patient to schedule a Palliative Consult, no answer and the Voicemail message was in Spanish so I didn't leave a message because I didn't understand it and I wasn't sure if this was the patient's number.  I then called pt's sister, Ricki Rodriguez, and explained to her why I was calling and she told me the number I called was patient's wife's cell and she didn't answer because she is working.  Sister said that she would speak with patient about Palliative services and call me back tomorrow to let me know if the patient is okay with scheduling a visit with [place].

## 2019-12-21 ENCOUNTER — Telehealth: Payer: Self-pay | Admitting: Primary Care

## 2019-12-21 NOTE — Telephone Encounter (Signed)
Spoke with patient's sister, Karna Christmas, to see if she spoke to the patient about Palliative services and she said she did.  Sister said that she gave him my contact number and I told her that he hasn't called [place] back.  She was in the middle of telling me something and we were diconnected,  I called her back but no one answered.  I left a message asking her to let me know something by the end of the week if patient wishes to pursue Palliative services or not and if I did not hear back from anyone by then I would cancel the referral and notify MD.  Left my name and contact number.

## 2020-01-12 ENCOUNTER — Telehealth: Payer: Self-pay

## 2020-01-12 NOTE — Telephone Encounter (Signed)
Patient had appointment with our office on October 1st. Was reviewing patient chart and he is seeing [facility]. Called patient and he states yes he is seeing them and states he wants to keep seeing them. He states he will call us back if he ever needs anything. Canceled patient appointment

## 2020-01-14 ENCOUNTER — Ambulatory Visit: Payer: Self-pay | Admitting: Gastroenterology

## 2020-02-24 ENCOUNTER — Telehealth: Payer: Self-pay | Admitting: Pharmacist

## 2020-02-24 NOTE — Telephone Encounter (Signed)
Patient failed to provide requested 2021 financial documentation. No additional medication assistance will be provided by [facility] without the required proof of income documentation. Patient notified by letter Debra Cheek Administrative Assistant [facility] 

## 2020-06-06 DIAGNOSIS — Q231 Congenital insufficiency of aortic valve: Secondary | ICD-10-CM | POA: Insufficient documentation

## 2020-08-08 ENCOUNTER — Emergency Department: Payer: Self-pay

## 2020-08-08 ENCOUNTER — Emergency Department
Admission: EM | Admit: 2020-08-08 | Discharge: 2020-08-09 | Disposition: A | Discharge status: Home / Self Care | Payer: Self-pay | Attending: Emergency Medicine | Admitting: Emergency Medicine

## 2020-08-08 ENCOUNTER — Other Ambulatory Visit: Payer: Self-pay

## 2020-08-08 DIAGNOSIS — K7200 Acute and subacute hepatic failure without coma: Secondary | ICD-10-CM | POA: Insufficient documentation

## 2020-08-08 DIAGNOSIS — Z23 Encounter for immunization: Secondary | ICD-10-CM | POA: Insufficient documentation

## 2020-08-08 DIAGNOSIS — Z8616 Personal history of COVID-19: Secondary | ICD-10-CM | POA: Insufficient documentation

## 2020-08-08 DIAGNOSIS — S0990XA Unspecified injury of head, initial encounter: Secondary | ICD-10-CM

## 2020-08-08 DIAGNOSIS — F1010 Alcohol abuse, uncomplicated: Secondary | ICD-10-CM | POA: Insufficient documentation

## 2020-08-08 DIAGNOSIS — W01198A Fall on same level from slipping, tripping and stumbling with subsequent striking against other object, initial encounter: Secondary | ICD-10-CM | POA: Insufficient documentation

## 2020-08-08 DIAGNOSIS — S0081XA Abrasion of other part of head, initial encounter: Secondary | ICD-10-CM | POA: Insufficient documentation

## 2020-08-08 LAB — COMPREHENSIVE METABOLIC PANEL
ALT: 28 U/L (ref 0–44)
AST: 58 U/L — ABNORMAL HIGH (ref 15–41)
Albumin: 3.1 g/dL — ABNORMAL LOW (ref 3.5–5.0)
Alkaline Phosphatase: 186 U/L — ABNORMAL HIGH (ref 38–126)
Anion gap: 10 (ref 5–15)
BUN: 5 mg/dL — ABNORMAL LOW (ref 6–20)
CO2: 18 mmol/L — ABNORMAL LOW (ref 22–32)
Calcium: 8.2 mg/dL — ABNORMAL LOW (ref 8.9–10.3)
Chloride: 109 mmol/L (ref 98–111)
Creatinine, Ser: 0.42 mg/dL — ABNORMAL LOW (ref 0.61–1.24)
GFR, Estimated: >60 mL/min (ref >60)
Glucose, Bld: 91 mg/dL (ref 70–99)
Potassium: 3.1 mmol/L — ABNORMAL LOW (ref 3.5–5.1)
Sodium: 137 mmol/L (ref 135–145)
Total Bilirubin: 2.2 mg/dL — ABNORMAL HIGH (ref 0.3–1.2)
Total Protein: 6.7 g/dL (ref 6.5–8.1)

## 2020-08-08 LAB — CBC WITH DIFFERENTIAL/PLATELET
Abs Immature Granulocytes: 0.01 10*3/uL (ref 0.00–0.07)
Basophils Absolute: 0.1 10*3/uL (ref 0.0–0.1)
Basophils Relative: 2 %
Eosinophils Absolute: 0 10*3/uL (ref 0.0–0.5)
Eosinophils Relative: 1 %
HCT: 26.6 % — ABNORMAL LOW (ref 39.0–52.0)
Hemoglobin: 8.6 g/dL — ABNORMAL LOW (ref 13.0–17.0)
Immature Granulocytes: 0 %
Lymphocytes Relative: 40 %
Lymphs Abs: 1.5 10*3/uL (ref 0.7–4.0)
MCH: 27 pg (ref 26.0–34.0)
MCHC: 32.3 g/dL (ref 30.0–36.0)
MCV: 83.4 fL (ref 80.0–100.0)
Monocytes Absolute: 0.3 10*3/uL (ref 0.1–1.0)
Monocytes Relative: 8 %
Neutro Abs: 1.8 10*3/uL (ref 1.7–7.7)
Neutrophils Relative %: 49 %
Platelets: 115 10*3/uL — ABNORMAL LOW (ref 150–400)
RBC: 3.19 MIL/uL — ABNORMAL LOW (ref 4.22–5.81)
RDW: 19.5 % — ABNORMAL HIGH (ref 11.5–15.5)
WBC: 3.6 10*3/uL — ABNORMAL LOW (ref 4.0–10.5)
nRBC: 0 % (ref 0.0–0.2)

## 2020-08-08 MED ORDER — SODIUM CHLORIDE 0.9 % IV BOLUS
1000.0000 mL | Freq: Once | INTRAVENOUS | Status: AC
  Administered 2020-08-08: 1000 mL via INTRAVENOUS

## 2020-08-08 MED ORDER — POTASSIUM CHLORIDE CRYS ER 20 MEQ PO TBCR
40.0000 meq | EXTENDED_RELEASE_TABLET | Freq: Once | ORAL | Status: AC
  Administered 2020-08-08: 40 meq via ORAL
  Filled 2020-08-08: qty 2

## 2020-08-08 MED ORDER — THIAMINE HCL 100 MG/ML IJ SOLN
100.0000 mg | Freq: Once | INTRAMUSCULAR | Status: AC
  Administered 2020-08-08: 100 mg via INTRAVENOUS
  Filled 2020-08-08: qty 2

## 2020-08-08 MED ORDER — TETANUS-DIPHTH-ACELL PERTUSSIS 5-2.5-18.5 LF-MCG/0.5 IM SUSY
0.5000 mL | PREFILLED_SYRINGE | Freq: Once | INTRAMUSCULAR | Status: AC
  Administered 2020-08-08: 0.5 mL via INTRAMUSCULAR
  Filled 2020-08-08: qty 0.5

## 2020-08-08 MED ORDER — FOLIC ACID 1 MG PO TABS
1.0000 mg | ORAL_TABLET | Freq: Once | ORAL | Status: AC
  Administered 2020-08-08: 1 mg via ORAL
  Filled 2020-08-08: qty 1

## 2020-08-08 MED ORDER — IOHEXOL 300 MG/ML  SOLN
100.0000 mL | Freq: Once | INTRAMUSCULAR | Status: AC | PRN
  Administered 2020-08-08: 100 mL via INTRAVENOUS

## 2020-08-08 NOTE — Discharge Instructions (Addendum)
He is to follow-up for hemoglobin recheck in 2 days given his hemoglobin today was slightly low.  When I evaluated him he did not seem to be any blood in his stools but if he develops black dark tarry stools he is to return to the ER emergently.  His CT scan shows signs of severe liver dysfunction that he needs to follow-up with GI for him to stop drinking alcohol  No acute intra-abdominal injury.     Large splenorenal shunt and hypertrophy of the hepatic arterial  vasculature. These are nonspecific findings that can be seen in the  setting of a early cirrhotic change and portal venous hypertension.  Tissue sampling or hepatic elastography may be helpful for further  evaluation.

## 2020-08-08 NOTE — ED Triage Notes (Signed)
Pt presents to the [facility] via EMS from [facility] with c/o fall, head injury, and alcohol intoxication. EMS states that pt fell and hit the front of his head which resulted in LOC for a ~30 seconds. Pt noted to have abrasion to right side of forehead. Pt reports drinking 8 beers prior to going to [employer]. Pt currently A&Ox4 with stable vital signs

## 2020-08-08 NOTE — ED Notes (Signed)
Dr. Marzetta Board with wife who is unable to pick up pt. I called his parents and they are also unable to get pt because they do not have a car.

## 2020-08-08 NOTE — ED Provider Notes (Signed)
[facility] Emergency Department Provider Note  ____________________________________________   Event Date/Time   First MD Initiated Contact with Patient 08/08/20 1559     (approximate)  I have reviewed the triage vital signs and the nursing notes.   HISTORY  Chief Complaint Fall, Head Injury, and Alcohol Intoxication    HPI Brett Washington is a 40 y.o. male with seizures, alcohol abuse, liver failure who comes in with concerns for alcohol abuse and fall.  Patient reports drinking 8 beers today.  Patient states that he fell forward and hit his head.  Positive LOC.  Asked patient if he was attempting to hurt himself and he said "I am a drunk not crazy".  Patient does have an abrasion to his forehead.  Patient denies any chest wall pain, abdominal pain, extremity pain or any other concerns.  Patient denies any new injuries to his back.          Past Medical History:  Diagnosis Date  . Alcohol abuse   . Heart murmur   . Seizures [facility])     Patient Active Problem List   Diagnosis Date Noted  . Elevated bilirubin   . Full code status   . Advanced directives, counseling/discussion   . Goals of care, counseling/discussion   . Palliative care by specialist   . Hematemesis 11/01/2019  . Acute hepatic encephalopathy 11/01/2019  . Leukocytosis 11/01/2019  . Abnormal LFTs 11/01/2019  . Seizure (HCC) 11/01/2019  . Alcohol abuse 11/01/2019  . Hepatic encephalopathy (HCC)   . Hypothermia 06/02/2019  . Hypothermia due to cold environment 06/02/2019  . Alcohol intoxication with delirium (HCC) 06/02/2019  . COVID-19 virus infection 06/02/2019  . LOC (loss of consciousness) (HCC) 01/11/2017  . Alcohol use disorder, severe, dependence (HCC) 11/06/2016  . Aspiration pneumonia due to vomit (HCC)   . Alcohol withdrawal seizure (HCC) 10/31/2016  . Thrombocytopenia (HCC) 10/31/2016  . Transaminitis 10/31/2016  . Hypokalemia 10/31/2016    Past Surgical History:   Procedure Laterality Date  . ESOPHAGOGASTRODUODENOSCOPY (EGD) WITH PROPOFOL N/A 11/02/2019   Procedure: ESOPHAGOGASTRODUODENOSCOPY (EGD) WITH PROPOFOL;  Surgeon: Toney Reil, MD;  Location: [facility] ENDOSCOPY;  Service: Gastroenterology;  Laterality: N/A;  . HAND SURGERY      Prior to Admission medications   Medication Sig Start Date End Date Taking? Authorizing Provider  gabapentin (NEURONTIN) 600 MG tablet Take 0.5 tablets (300 mg total) by mouth at bedtime. 11/09/19 12/09/19  Dorcas Carrow, MD  lactulose (CHRONULAC) 10 GM/15ML solution Take 30 mLs (20 g total) by mouth 2 (two) times daily as needed for mild constipation (to make 2-3 loose BM a day). 11/09/19   Dorcas Carrow, MD  levETIRAcetam (KEPPRA) 1000 MG tablet Take 1 tablet (1,000 mg total) by mouth 2 (two) times daily. 11/09/19 12/09/19  Dorcas Carrow, MD  pantoprazole (PROTONIX) 40 MG tablet Take 1 tablet (40 mg total) by mouth 2 (two) times daily. 11/09/19 12/09/19  Dorcas Carrow, MD  potassium chloride SA (KLOR-CON) 20 MEQ tablet Take 1 tablet (20 mEq total) by mouth daily for 7 days. 11/09/19 11/16/19  Dorcas Carrow, MD  rifaximin (XIFAXAN) 550 MG TABS tablet Take 1 tablet (550 mg total) by mouth 2 (two) times daily. 11/09/19   Dorcas Carrow, MD    Allergies Patient has no known allergies.  Family History  Problem Relation Age of Onset  . Diabetes Mellitus II Mother     Social History Social History   Tobacco Use  . Smoking status: Never Smoker  .  Smokeless tobacco: Never Used  Vaping Use  . Vaping Use: Never used  Substance Use Topics  . Alcohol use: Yes    Alcohol/week: 56.0 standard drinks    Types: 56 Cans of beer per week  . Drug use: No      Review of Systems Constitutional: No fever/chills, positive alcohol use Eyes: No visual changes. ENT: No sore throat.  Positive head injury Cardiovascular: Denies chest pain. Respiratory: Denies shortness of breath. Gastrointestinal: No abdominal pain.  No  nausea, no vomiting.  No diarrhea.  No constipation. Genitourinary: Negative for dysuria. Musculoskeletal: Negative for back pain. Skin: Negative for rash. Neurological: Negative for headaches, focal weakness or numbness. All other ROS negative ____________________________________________   PHYSICAL EXAM:  VITAL SIGNS: Blood pressure 108/69, pulse 90, temperature 98 F (36.7 C), temperature source Oral, resp. rate 15, height 5\' 8"  (1.727 m), weight 75 kg, SpO2 99 %.  Constitutional: Alert and oriented although does appear intoxicated Eyes: Conjunctivae are normal. EOMI. Head: Abrasion on the right side of his forehead Nose: No congestion/rhinnorhea. Mouth/Throat: Mucous membranes are moist.   Neck: No stridor. Trachea Midline. FROM Cardiovascular: Normal rate, regular rhythm. Grossly normal heart sounds.  Good peripheral circulation. Respiratory: Normal respiratory effort.  No retractions. Lungs CTAB. Gastrointestinal: Soft and nontender. No distention. No abdominal bruits.  Musculoskeletal: No lower extremity tenderness nor edema.  No joint effusions.  No pain on his arms or legs. Neurologic: Slurred speech due to intoxication and language. No gross focal neurologic deficits are appreciated.  Patient is able to follow commands. Skin:  Skin is warm, dry and intact. No rash noted. Psychiatric: Mood and affect are normal. Speech and behavior are normal. GU: Deferred   ____________________________________________   LABS (all labs ordered are listed, but only abnormal results are displayed)  Labs Reviewed  CBC WITH DIFFERENTIAL/PLATELET - Abnormal; Notable for the following components:      Result Value   WBC 3.6 (*)    RBC 3.19 (*)    Hemoglobin 8.6 (*)    HCT 26.6 (*)    RDW 19.5 (*)    Platelets 115 (*)    All other components within normal limits  COMPREHENSIVE METABOLIC PANEL - Abnormal; Notable for the following components:   Potassium 3.1 (*)    CO2 18 (*)    BUN 5  (*)    Creatinine, Ser 0.42 (*)    Calcium 8.2 (*)    Albumin 3.1 (*)    AST 58 (*)    Alkaline Phosphatase 186 (*)    Total Bilirubin 2.2 (*)    All other components within normal limits   ____________________________________________   ED ECG REPORT I, Concha SeMary E Thurza Kwiecinski, the attending physician, personally viewed and interpreted this ECG.  Normal sinus rate of 86, no ST elevation, no T wave inversions, normal intervals ____________________________________________  RADIOLOGY  Official radiology report(s): CT Head Wo Contrast  Result Date: 08/08/2020 CLINICAL DATA:  Fall with head trauma and loss of consciousness EXAM: CT HEAD WITHOUT CONTRAST CT CERVICAL SPINE WITHOUT CONTRAST TECHNIQUE: Multidetector CT imaging of the head and cervical spine was performed following the standard protocol without intravenous contrast. Multiplanar CT image reconstructions of the cervical spine were also generated. COMPARISON:  November 01, 2019 FINDINGS: CT HEAD FINDINGS Brain: No evidence of acute large vascular territory infarction, hemorrhage, hydrocephalus, extra-axial collection or mass lesion/mass effect. Similar slight prominence of the ventricles and sulci compared with minor parenchymal volume loss, greater than that expected for age. Vascular: No  hyperdense vessel or unexpected calcification. Skull: Normal. Negative for fracture or focal lesion. Sinuses/Orbits: Paranasal sinuses and mastoid air cells are predominantly clear. Other: Extracalvarial hematoma and soft tissue edema centered over the right frontal bone. CT CERVICAL SPINE FINDINGS Alignment: Normal. Skull base and vertebrae: No acute fracture. No primary bone lesion or focal pathologic process. Soft tissues and spinal canal: No prevertebral fluid or swelling. No visible canal hematoma. Disc levels:  Preserved Upper chest: Right upper lobe pleuroparenchymal scarring. Other: None IMPRESSION: 1. Extracalvarial hematoma and soft tissue edema centered over  the right frontal bone. No underlying calvarial fracture. 2. No acute intracranial abnormality. 3. No evidence of acute fracture or traumatic listhesis of the cervical spine. Electronically Signed   By: Maudry Mayhew MD   On: 08/08/2020 16:51   CT Cervical Spine Wo Contrast  Result Date: 08/08/2020 CLINICAL DATA:  Fall with head trauma and loss of consciousness EXAM: CT HEAD WITHOUT CONTRAST CT CERVICAL SPINE WITHOUT CONTRAST TECHNIQUE: Multidetector CT imaging of the head and cervical spine was performed following the standard protocol without intravenous contrast. Multiplanar CT image reconstructions of the cervical spine were also generated. COMPARISON:  November 01, 2019 FINDINGS: CT HEAD FINDINGS Brain: No evidence of acute large vascular territory infarction, hemorrhage, hydrocephalus, extra-axial collection or mass lesion/mass effect. Similar slight prominence of the ventricles and sulci compared with minor parenchymal volume loss, greater than that expected for age. Vascular: No hyperdense vessel or unexpected calcification. Skull: Normal. Negative for fracture or focal lesion. Sinuses/Orbits: Paranasal sinuses and mastoid air cells are predominantly clear. Other: Extracalvarial hematoma and soft tissue edema centered over the right frontal bone. CT CERVICAL SPINE FINDINGS Alignment: Normal. Skull base and vertebrae: No acute fracture. No primary bone lesion or focal pathologic process. Soft tissues and spinal canal: No prevertebral fluid or swelling. No visible canal hematoma. Disc levels:  Preserved Upper chest: Right upper lobe pleuroparenchymal scarring. Other: None IMPRESSION: 1. Extracalvarial hematoma and soft tissue edema centered over the right frontal bone. No underlying calvarial fracture. 2. No acute intracranial abnormality. 3. No evidence of acute fracture or traumatic listhesis of the cervical spine. Electronically Signed   By: Maudry Mayhew MD   On: 08/08/2020 16:51   CT ABDOMEN PELVIS W  CONTRAST  Result Date: 08/08/2020 CLINICAL DATA:  Intoxication, fall, blunt abdominal trauma, back pain, anemia EXAM: CT ABDOMEN AND PELVIS WITH CONTRAST TECHNIQUE: Multidetector CT imaging of the abdomen and pelvis was performed using the standard protocol following bolus administration of intravenous contrast. CONTRAST:  OMNIPAQUE IOHEXOL 300 MG/ML  SOLN COMPARISON:  11/01/2019 FINDINGS: Lower chest: Visualized lung bases are clear. The visualized heart and pericardium are unremarkable. Hepatobiliary: No focal liver abnormality is seen. No gallstones, gallbladder wall thickening, or biliary dilatation. Pancreas: Unremarkable Spleen: Unremarkable Adrenals/Urinary Tract: The adrenal glands are unremarkable. The kidneys are normal in size and position. There is variant anatomy with the renal pelves arising from the lower pole of the kidneys bilaterally, however, there is no hydronephrosis. No intrarenal masses are seen. No intrarenal or ureteral calculi are identified. The bladder is unremarkable. Stomach/Bowel: Stomach is within normal limits. Appendix appears normal. No evidence of bowel wall thickening, distention, or inflammatory changes. No free intraperitoneal gas or fluid. Vascular/Lymphatic: A large splenorenal shunt is present. The hepatic arterial vasculature appears hypertrophied. Together, these findings can be seen in the setting of early cirrhotic change and portal venous hypertension, though this is nonspecific. Variant vascular anatomy is seen involving the kidneys bilaterally with a a  right accessory renal artery to the lower pole and a inferior polar draining vein involving the left kidney draining directly to the left common iliac vein. No pathologic adenopathy within the abdomen and pelvis. Reproductive: Prostate is unremarkable. Other: Small fat containing right inguinal hernia. Rectum is unremarkable. No retroperitoneal hematoma identified. Musculoskeletal: No acute bone abnormality.  IMPRESSION: No acute intra-abdominal injury. Large splenorenal shunt and hypertrophy of the hepatic arterial vasculature. These are nonspecific findings that can be seen in the setting of a early cirrhotic change and portal venous hypertension. Tissue sampling or hepatic elastography may be helpful for further evaluation. Electronically Signed   By: Helyn Numbers MD   On: 08/08/2020 22:22    ____________________________________________   PROCEDURES  Procedure(s) performed (including Critical Care):  Procedures   ____________________________________________   INITIAL IMPRESSION / ASSESSMENT AND PLAN / ED COURSE  Brett Washington was evaluated in Emergency Department on 08/08/2020 for the symptoms described in the history of present illness. He was evaluated in the context of the global COVID-19 pandemic, which necessitated consideration that the patient might be at risk for infection with the SARS-CoV-2 virus that causes COVID-19. Institutional protocols and algorithms that pertain to the evaluation of patients at risk for COVID-19 are in a state of rapid change based on information released by regulatory bodies including the CDC and federal and state organizations. These policies and algorithms were followed during the patient's care in the ED.    Patient comes in with alcohol use and a fall resulting in LOC.  Will get CT head to evaluate for intracranial hemorrhage, CT cervical evaluate for cervical fracture.  Upon my exam he does not seem to be tender anywhere else patient will need tertiary exam when patient is more sober.  Will get basic labs to make sure no evidence of severe electrolyte abnormalities that could have contributed to the fall given patient's alcohol abuse.  We will update patient's tetanus and get an EKG due to the LOC.  Labs show white count of 3.6 but patient is afebrile and does not have any infectious symptoms.  His hemoglobin is 8.6.  I did review in 1 month ago his  hemoglobin was 9.8.  Patient does have a history of GI bleeds I did perform a rectal exam that had brown stool that was Hemoccult negative.  Patient's LFTs are elevated but that is consistent with his known history of liver cirrhosis.  Will reevaluate patient when he is more sober and suspect patient would be able to go home once he is sober.  10:01 PM my tertiary exam patient is speaking more clear he has been in the emergency room for over 6 hours.  Patient is only complaining of some back pain.  CT abdomen pelvis just to make sure there is no retroperitoneal bleed given his slight drop in hemoglobin.  I did talk to patient's wife on the phone and updated her on the results and that he needs to follow-up for repeat hemoglobin in 2 days just to make sure is not continuing to go down.  She expressed understanding.  If CT is negative will discharge patient home  Scan does show some incidental findings which I will provide to patient and his family.  He has known cirrhosis but still elects to continue drinking.  We will give him GI follow-up.  I discussed the provisional nature of ED diagnosis, the treatment so far, the ongoing plan of care, follow up appointments and return precautions with the patient and  any family or support people present. They expressed understanding and agreed with the plan, discharged home.   ____________________________________________   FINAL CLINICAL IMPRESSION(S) / ED DIAGNOSES   Final diagnoses:  Alcohol abuse  Acute liver failure without hepatic coma  Injury of head, initial encounter      MEDICATIONS GIVEN DURING THIS VISIT:  Medications  Tdap (BOOSTRIX) injection 0.5 mL (0.5 mLs Intramuscular Given 08/08/20 1735)  potassium chloride SA (KLOR-CON) CR tablet 40 mEq (40 mEq Oral Given 08/08/20 1736)  thiamine (B-1) injection 100 mg (100 mg Intravenous Given 08/08/20 1736)  folic acid (FOLVITE) tablet 1 mg (1 mg Oral Given 08/08/20 1736)  sodium chloride 0.9 %  bolus 1,000 mL (1,000 mLs Intravenous New Bag/Given 08/08/20 1736)     ED Discharge Orders    None       Note:  This document was prepared using Dragon voice recognition software and may include unintentional dictation errors.   Concha Se, MD 08/08/20 [phone]

## 2020-08-09 NOTE — ED Notes (Signed)
Pt ambulated to cab without difficulty. VSS NAD. All questions and concerns were addressed.

## 2020-09-27 ENCOUNTER — Emergency Department: Payer: Self-pay

## 2020-09-27 ENCOUNTER — Observation Stay
Admission: EM | Admit: 2020-09-27 | Discharge: 2020-09-28 | Disposition: A | Discharge status: Home / Self Care | Payer: Self-pay | Attending: Internal Medicine | Admitting: Internal Medicine

## 2020-09-27 DIAGNOSIS — F10920 Alcohol use, unspecified with intoxication, uncomplicated: Secondary | ICD-10-CM

## 2020-09-27 DIAGNOSIS — G40909 Epilepsy, unspecified, not intractable, without status epilepticus: Secondary | ICD-10-CM

## 2020-09-27 DIAGNOSIS — E7220 Disorder of urea cycle metabolism, unspecified: Secondary | ICD-10-CM

## 2020-09-27 DIAGNOSIS — K7682 Hepatic encephalopathy: Secondary | ICD-10-CM | POA: Diagnosis present

## 2020-09-27 DIAGNOSIS — K7290 Hepatic failure, unspecified without coma: Principal | ICD-10-CM | POA: Insufficient documentation

## 2020-09-27 DIAGNOSIS — Y900 Blood alcohol level of less than 20 mg/100 ml: Secondary | ICD-10-CM | POA: Insufficient documentation

## 2020-09-27 DIAGNOSIS — Z20822 Contact with and (suspected) exposure to covid-19: Secondary | ICD-10-CM | POA: Insufficient documentation

## 2020-09-27 DIAGNOSIS — R4182 Altered mental status, unspecified: Secondary | ICD-10-CM

## 2020-09-27 DIAGNOSIS — F10120 Alcohol abuse with intoxication, uncomplicated: Secondary | ICD-10-CM | POA: Insufficient documentation

## 2020-09-27 LAB — RESP PANEL BY RT-PCR (FLU A&B, COVID) ARPGX2
Influenza A by PCR: NEGATIVE
Influenza B by PCR: NEGATIVE
SARS Coronavirus 2 by RT PCR: NEGATIVE

## 2020-09-27 LAB — AMMONIA: Ammonia: 69 umol/L — ABNORMAL HIGH (ref 9–35)

## 2020-09-27 LAB — CBC WITH DIFFERENTIAL/PLATELET
Abs Immature Granulocytes: 0 10*3/uL (ref 0.00–0.07)
Basophils Absolute: 0.1 10*3/uL (ref 0.0–0.1)
Basophils Relative: 1 %
Eosinophils Absolute: 0.1 10*3/uL (ref 0.0–0.5)
Eosinophils Relative: 2 %
HCT: 30 % — ABNORMAL LOW (ref 39.0–52.0)
Hemoglobin: 9.8 g/dL — ABNORMAL LOW (ref 13.0–17.0)
Immature Granulocytes: 0 %
Lymphocytes Relative: 52 %
Lymphs Abs: 2.8 10*3/uL (ref 0.7–4.0)
MCH: 27 pg (ref 26.0–34.0)
MCHC: 32.7 g/dL (ref 30.0–36.0)
MCV: 82.6 fL (ref 80.0–100.0)
Monocytes Absolute: 0.4 10*3/uL (ref 0.1–1.0)
Monocytes Relative: 8 %
Neutro Abs: 2 10*3/uL (ref 1.7–7.7)
Neutrophils Relative %: 37 %
Platelets: 116 10*3/uL — ABNORMAL LOW (ref 150–400)
RBC: 3.63 MIL/uL — ABNORMAL LOW (ref 4.22–5.81)
RDW: 19.2 % — ABNORMAL HIGH (ref 11.5–15.5)
WBC: 5.4 10*3/uL (ref 4.0–10.5)
nRBC: 0 % (ref 0.0–0.2)

## 2020-09-27 LAB — ETHANOL: Alcohol, Ethyl (B): 442 mg/dL (ref <10)

## 2020-09-27 MED ORDER — THIAMINE HCL 100 MG/ML IJ SOLN
Freq: Once | INTRAVENOUS | Status: AC
  Filled 2020-09-27: qty 1000

## 2020-09-27 MED ORDER — MAGNESIUM HYDROXIDE 400 MG/5ML PO SUSP: 30.0000 mL | Freq: Every day | ORAL | Status: DC | PRN

## 2020-09-27 MED ORDER — TRAZODONE HCL 50 MG PO TABS
25.0000 mg | ORAL_TABLET | Freq: Every evening | ORAL | Status: DC | PRN
  Administered 2020-09-28: 25 mg via ORAL
  Filled 2020-09-27: qty 1

## 2020-09-27 MED ORDER — LACTULOSE 10 GM/15ML PO SOLN
30.0000 g | Freq: Once | ORAL | Status: AC
  Administered 2020-09-27: 30 g via ORAL
  Filled 2020-09-27: qty 60

## 2020-09-27 MED ORDER — ONDANSETRON HCL 4 MG PO TABS: 4.0000 mg | ORAL_TABLET | Freq: Four times a day (QID) | ORAL | Status: DC | PRN

## 2020-09-27 MED ORDER — ACETAMINOPHEN 325 MG PO TABS: 650.0000 mg | ORAL_TABLET | Freq: Four times a day (QID) | ORAL | Status: DC | PRN

## 2020-09-27 MED ORDER — ONDANSETRON HCL 4 MG/2ML IJ SOLN: 4.0000 mg | Freq: Four times a day (QID) | INTRAMUSCULAR | Status: DC | PRN

## 2020-09-27 MED ORDER — SODIUM CHLORIDE 0.9 % IV BOLUS
1000.0000 mL | Freq: Once | INTRAVENOUS | Status: AC
  Administered 2020-09-27: 1000 mL via INTRAVENOUS

## 2020-09-27 MED ORDER — ENOXAPARIN SODIUM 40 MG/0.4ML IJ SOSY
40.0000 mg | PREFILLED_SYRINGE | INTRAMUSCULAR | Status: DC
  Administered 2020-09-28: 40 mg via SUBCUTANEOUS
  Filled 2020-09-27: qty 0.4

## 2020-09-27 MED ORDER — ACETAMINOPHEN 650 MG RE SUPP: 650.0000 mg | Freq: Four times a day (QID) | RECTAL | Status: DC | PRN

## 2020-09-27 NOTE — ED Triage Notes (Signed)
Pt arrived via [facility] from a [employer] [place] parking lot. Pt unresponsive, able to mumble a bit to bystander on scene who called EMS.   Per EMS, bystander stated pt found lying on back in parking lot with nobody around. Per bystander, pt said he thinks he had a seizure and has hx.   Per EMS, bystander stated that they were also able to get that he doesn't have any drug allergies and denies recent ETOH, drug use, or trauma.   Pt unable to follow commands at this time

## 2020-09-27 NOTE — ED Provider Notes (Signed)
[facility] Emergency Department Provider Note  ____________________________________________   I have reviewed the triage vital signs and the nursing notes.   HISTORY  Chief Complaint Altered Mental Status   History limited by: Altered mental status   HPI Brett Washington is a 40 y.o. male who presents to the emergency department today via EMS after being found minimally responsive outside of a gas station.  Patient is awake and alert here in the emergency department however cannot give any significant history.  No previous records available.   Prior to Admission medications   Not on File    Allergies Patient has no allergy information on record.  No family history on file.  Social History Does admit to alcohol use.   Review of Systems Unable to obtain reliable ROS.   ____________________________________________   PHYSICAL EXAM:  VITAL SIGNS: ED Triage Vitals  Enc Vitals Group     BP 09/27/20 2152 111/78     Pulse Rate 09/27/20 2150 84     Resp 09/27/20 2150 19     Temp 09/27/20 2152 (!) 97.4 F (36.3 C)     Temp Source 09/27/20 2152 Oral     SpO2 09/27/20 2150 98 %     Weight 09/27/20 2154 187 lb 6.3 oz (85 kg)     Height 09/27/20 2154 5\' 8"  (1.727 m)    Constitutional: Awake and alert.  Eyes: Conjunctivae are normal.  ENT      Head: Normocephalic.      Nose: No congestion/rhinnorhea.      Mouth/Throat: Mucous membranes are moist.      Neck: No stridor. Hematological/Lymphatic/Immunilogical: No cervical lymphadenopathy. Cardiovascular: Normal rate, regular rhythm.  Positive for systolic murmur.  Respiratory: Normal respiratory effort without tachypnea nor retractions. Breath sounds are clear and equal bilaterally. No wheezes/rales/rhonchi. Gastrointestinal: Soft and non tender. No rebound. No guarding.  Genitourinary: Deferred Musculoskeletal: Normal range of motion in all extremities. No lower extremity  edema. Neurologic:  Awake and alert. Not oriented to events. Moving all extremities.  Skin:  Skin is warm, dry and intact. No rash noted. ____________________________________________    LABS (pertinent positives/negatives)  Ammonia 69 Ethanol 442 CMP na 141, k 3.2, glu 112, cr 0.42 CBC wbc 5.4, hgb 9.8, plt 116  ____________________________________________   EKG  I, , attending physician, personally viewed and interpreted this EKG  EKG Time: 2157 Rate: 79 Rhythm: sinus rhythm Axis: left axis deviation Intervals: qtc 505 QRS: RBBB, LAFB ST changes: no st elevation Impression: abnormal ekg   ____________________________________________    RADIOLOGY  CT head No acute intracranial abnormality. No osseous injury.  ____________________________________________   PROCEDURES  Procedures  ____________________________________________   INITIAL IMPRESSION / ASSESSMENT AND PLAN / ED COURSE  Pertinent labs & imaging results that were available during my care of the patient were reviewed by me and considered in my medical decision making (see chart for details).   Patient presents to the emergency department today because of concerns for altered mental status.  Patient was unable to give any history.  Work-up here is concerning for elevated ethanol level.  Patient also has elevated ammonia level.  At this point do think likely patient's altered mental status is largely in part due to alcohol intoxication however cannot completely rule out hepatic encephalopathy.  Patient will be given lactulose here.  Will plan on admission to the hospital service.  ___________________________________________   FINAL CLINICAL IMPRESSION(S) / ED DIAGNOSES  Final diagnoses:  Hyperammonemia (HCC)  Alcoholic intoxication without complication (HCC)  Altered mental status, unspecified altered mental status type     Note: This dictation was prepared with Dragon dictation.  Any transcriptional errors that result from this process are unintentional     Phineas Semen, MD 09/27/20 [phone]

## 2020-09-27 NOTE — ED Notes (Signed)
Patient now responding, remains lethargic, patient able to tell RN name and DOB.

## 2020-09-28 ENCOUNTER — Other Ambulatory Visit: Payer: Self-pay

## 2020-09-28 LAB — CBC
HCT: 29.3 % — ABNORMAL LOW (ref 39.0–52.0)
Hemoglobin: 9.5 g/dL — ABNORMAL LOW (ref 13.0–17.0)
MCH: 27 pg (ref 26.0–34.0)
MCHC: 32.4 g/dL (ref 30.0–36.0)
MCV: 83.2 fL (ref 80.0–100.0)
Platelets: 103 10*3/uL — ABNORMAL LOW (ref 150–400)
RBC: 3.52 MIL/uL — ABNORMAL LOW (ref 4.22–5.81)
RDW: 18.9 % — ABNORMAL HIGH (ref 11.5–15.5)
WBC: 4.6 10*3/uL (ref 4.0–10.5)
nRBC: 0 % (ref 0.0–0.2)

## 2020-09-28 LAB — URINE DRUG SCREEN, QUALITATIVE (ARMC ONLY)
Amphetamines, Ur Screen: NOT DETECTED
Barbiturates, Ur Screen: NOT DETECTED
Benzodiazepine, Ur Scrn: NOT DETECTED
Cannabinoid 50 Ng, Ur ~~LOC~~: NOT DETECTED
Cocaine Metabolite,Ur ~~LOC~~: NOT DETECTED
MDMA (Ecstasy)Ur Screen: NOT DETECTED
Methadone Scn, Ur: NOT DETECTED
Opiate, Ur Screen: NOT DETECTED
Phencyclidine (PCP) Ur S: NOT DETECTED
Tricyclic, Ur Screen: NOT DETECTED

## 2020-09-28 LAB — COMPREHENSIVE METABOLIC PANEL
ALT: 51 U/L — ABNORMAL HIGH (ref 0–44)
ALT: 48 U/L — ABNORMAL HIGH (ref 0–44)
AST: 104 U/L — ABNORMAL HIGH (ref 15–41)
AST: 95 U/L — ABNORMAL HIGH (ref 15–41)
Albumin: 3 g/dL — ABNORMAL LOW (ref 3.5–5.0)
Albumin: 2.6 g/dL — ABNORMAL LOW (ref 3.5–5.0)
Alkaline Phosphatase: 149 U/L — ABNORMAL HIGH (ref 38–126)
Alkaline Phosphatase: 135 U/L — ABNORMAL HIGH (ref 38–126)
Anion gap: 8 (ref 5–15)
Anion gap: 5 (ref 5–15)
BUN: 5 mg/dL — ABNORMAL LOW (ref 6–20)
BUN: <5 mg/dL — ABNORMAL LOW (ref 6–20)
CO2: 25 mmol/L (ref 22–32)
CO2: 25 mmol/L (ref 22–32)
Calcium: 7.9 mg/dL — ABNORMAL LOW (ref 8.9–10.3)
Calcium: 7.6 mg/dL — ABNORMAL LOW (ref 8.9–10.3)
Chloride: 108 mmol/L (ref 98–111)
Chloride: 113 mmol/L — ABNORMAL HIGH (ref 98–111)
Creatinine, Ser: 0.42 mg/dL — ABNORMAL LOW (ref 0.61–1.24)
Creatinine, Ser: 0.3 mg/dL — ABNORMAL LOW (ref 0.61–1.24)
GFR, Estimated: >60 mL/min (ref >60)
GFR, Estimated: >60 mL/min (ref >60)
Glucose, Bld: 112 mg/dL — ABNORMAL HIGH (ref 70–99)
Glucose, Bld: 87 mg/dL (ref 70–99)
Potassium: 3.2 mmol/L — ABNORMAL LOW (ref 3.5–5.1)
Potassium: 2.9 mmol/L — ABNORMAL LOW (ref 3.5–5.1)
Sodium: 141 mmol/L (ref 135–145)
Sodium: 143 mmol/L (ref 135–145)
Total Bilirubin: 2.8 mg/dL — ABNORMAL HIGH (ref 0.3–1.2)
Total Bilirubin: 2.6 mg/dL — ABNORMAL HIGH (ref 0.3–1.2)
Total Protein: 6.6 g/dL (ref 6.5–8.1)
Total Protein: 5.9 g/dL — ABNORMAL LOW (ref 6.5–8.1)

## 2020-09-28 LAB — MAGNESIUM: Magnesium: 1.9 mg/dL (ref 1.7–2.4)

## 2020-09-28 LAB — PHOSPHORUS: Phosphorus: 4.2 mg/dL (ref 2.5–4.6)

## 2020-09-28 MED ORDER — LACTULOSE 10 GM/15ML PO SOLN: 30.0000 g | Freq: Three times a day (TID) | ORAL | Status: DC

## 2020-09-28 MED ORDER — LORAZEPAM 2 MG/ML IJ SOLN
1.0000 mg | INTRAMUSCULAR | Status: DC | PRN
  Administered 2020-09-28: 1 mg via INTRAVENOUS
  Filled 2020-09-28: qty 1

## 2020-09-28 MED ORDER — LACTATED RINGERS IV BOLUS
2000.0000 mL | Freq: Once | INTRAVENOUS | Status: AC
  Administered 2020-09-28: 2000 mL via INTRAVENOUS

## 2020-09-28 MED ORDER — POTASSIUM CHLORIDE CRYS ER 20 MEQ PO TBCR
40.0000 meq | EXTENDED_RELEASE_TABLET | ORAL | Status: AC
  Administered 2020-09-28 (×2): 40 meq via ORAL
  Filled 2020-09-28 (×2): qty 2

## 2020-09-28 MED ORDER — PNEUMOCOCCAL VAC POLYVALENT 25 MCG/0.5ML IJ INJ: 0.5000 mL | INJECTION | INTRAMUSCULAR | Status: DC

## 2020-09-28 MED ORDER — SODIUM CHLORIDE 0.9 % IV SOLN: INTRAVENOUS | Status: DC

## 2020-09-28 NOTE — Progress Notes (Signed)
Patient does not want family to know he is at the hospital unless something goes bad. Only contact his wife.

## 2020-09-28 NOTE — Progress Notes (Signed)
DISCHARGE NOTE:  Pt given discharge instructions by charge nurse. Pt verbalized understanding. Pt wheeled to car by staff. Cab providing transportation.

## 2020-09-28 NOTE — Plan of Care (Signed)
  Problem: Education: Goal: Knowledge of General Education information will improve Description: Including pain rating scale, medication(s)/side effects and non-pharmacologic comfort measures Outcome: Progressing Note: Patient profile completed, interpreter not needed. No pain. Skin intact. Patient is currently going through withdrawal.    Problem: Health Behavior/Discharge Planning: Goal: Ability to manage health-related needs will improve Outcome: Not Progressing

## 2020-09-28 NOTE — H&P (Addendum)
[place]   PATIENT NAME: Brett Washington    MR#:  [id number]  DATE OF BIRTH:  11-07-1980  DATE OF ADMISSION:  09/27/2020  PRIMARY CARE PHYSICIAN: [facility]   Patient is coming from: Home  REQUESTING/REFERRING PHYSICIAN: Phineas Semen, MD  CHIEF COMPLAINT:   Chief Complaint  Patient presents with   Altered Mental Status    HISTORY OF PRESENT ILLNESS:  Brett Washington is a 40 y.o. Hispanic male with medical history significant for alcohol abuse, seizure disorder and cardiac murmur, who presents emergency room with acute onset of altered mental status, being found minimally responsive [place] of a gas station.  It was still having mild confusion during his ER stay.  He denied any chest pain or dyspnea or cough or wheezing.  No nausea or vomiting or abdominal pain or diarrhea.  No dysuria, oliguria or hematuria or flank pain.  He denies any abdominal pain.  No chest pain or palpitations. ED Course: Upon presentation to the emergency room vital signs were within normal.  Labs revealed mild hypokalemia 3.2 and elevated AST of 104, ALT 51 and ammonia level was elevated at 69 with total bili of 2.8 and albumin 3.  Total protein was 6.6.  CBC showed anemia.  Influenza antigens and COVID-19 PCR came back negative.  Alcohol level was 442. EKG as reviewed by me : Showed normal sinus rhythm with a rate of 79 with right bundle branch block and left intrafascicular block and probable LVH. Imaging: Noncontrast head CT scan revealed right parietal scalp swelling without subjacent calvarial fracture or acute intracranial normality.  The patient was given 1 L bolus of IV normal saline and 30 g of lactulose.  He will be admitted to an observation medical monitored bed for further evaluation and management. PAST MEDICAL HISTORY:  Alcohol abuse, seizure disorder and cardiac murmur  PAST SURGICAL HISTORY:   He denies any previous surgeries.  SOCIAL HISTORY:   Social  History   Tobacco Use   Smoking status: Not on file   Smokeless tobacco: Not on file  Substance Use Topics   Alcohol use: Not on file    FAMILY HISTORY:  Positive for diabetes mellitus for his mother.  DRUG ALLERGIES:  Not on File  REVIEW OF SYSTEMS:   ROS As per history of present illness. All pertinent systems were reviewed above. Constitutional, HEENT, cardiovascular, respiratory, GI, GU, musculoskeletal, neuro, psychiatric, endocrine, integumentary and hematologic systems were reviewed and are otherwise negative/unremarkable except for positive findings mentioned above in the HPI.   MEDICATIONS AT HOME:   Prior to Admission medications   Not on File      VITAL SIGNS:  Blood pressure 93/67, pulse 83, temperature (!) 97.4 F (36.3 C), temperature source Oral, resp. rate 14, height 5\' 8"  (1.727 m), weight 85 kg, SpO2 100 %.  PHYSICAL EXAMINATION:  Physical Exam  GENERAL:  40 y.o.-year-old patient lying in the bed with no acute distress.  He was somnolent but arousable.  He was slow to answer questions. EYES: Pupils equal, round, reactive to light and accommodation. No scleral icterus. Extraocular muscles intact.  HEENT: Head atraumatic, normocephalic. Oropharynx and nasopharynx clear.  NECK:  Supple, no jugular venous distention. No thyroid enlargement, no tenderness.  LUNGS: Normal breath sounds bilaterally, no wheezing, rales,rhonchi or crepitation. No use of accessory muscles of respiration.  CARDIOVASCULAR: Regular rate and rhythm, S1, S2 normal.  2/6 systolic ejection murmur at the left lower sternal border.  No rubs, or gallops.  ABDOMEN: Soft, nondistended, nontender. Bowel sounds present. No organomegaly or mass.  EXTREMITIES: No pedal edema, cyanosis, or clubbing.  NEUROLOGIC: Cranial nerves II through XII are intact. Muscle strength 5/5 in all extremities. Sensation intact. Gait not checked.  He was somnolent but arousable.  He was slow to answer  questions. PSYCHIATRIC: The patient is more alert and cooperative during my evaluation. SKIN: No obvious rash, lesion, or ulcer.   LABORATORY PANEL:   CBC Recent Labs  Lab 09/27/20 2152  WBC 5.4  HGB 9.8*  HCT 30.0*  PLT 116*   ------------------------------------------------------------------------------------------------------------------  Chemistries  Recent Labs  Lab 09/27/20 2152  NA 141  K 3.2*  CL 108  CO2 25  GLUCOSE 112*  BUN 5*  CREATININE 0.42*  CALCIUM 7.9*  AST 104*  ALT 51*  ALKPHOS 149*  BILITOT 2.8*   ------------------------------------------------------------------------------------------------------------------  Cardiac Enzymes No results for input(s): TROPONINI in the last 168 hours. ------------------------------------------------------------------------------------------------------------------  RADIOLOGY:  CT Head Wo Contrast  Result Date: 09/27/2020 CLINICAL DATA:  Altered mental status, found unresponsive EXAM: CT HEAD WITHOUT CONTRAST TECHNIQUE: Contiguous axial images were obtained from the base of the skull through the vertex without intravenous contrast. COMPARISON:  None. FINDINGS: Brain: No evidence of acute infarction, hemorrhage, hydrocephalus, extra-axial collection, visible mass lesion or mass effect. Suspect a degree of parenchymal volume loss. Basal cisterns are patent. Midline intracranial structures are unremarkable. Cerebellar tonsils are normally positioned. Vascular: Insert brain vascular Skull: Right parietal scalp swelling and skin thickening. No subjacent calvarial fracture. No other acute or worrisome osseous abnormalities. Sinuses/Orbits: Paranasal sinuses and mastoid air cells are predominantly clear. Included orbital structures are unremarkable. Other: None IMPRESSION: Right parietal scalp swelling without subjacent calvarial fracture or acute intracranial abnormality. Electronically Signed   By: Kreg Shropshire M.D.   On:  09/27/2020 22:18      IMPRESSION AND PLAN:  Active Problems:   Hepatic encephalopathy (HCC)  1.  Acute hepatic encephalopathy. - The patient will be admitted to an observation medically monitored bed. - We will place him on lactulose 30 g p.o. 3 times daily. - We will follow ammonia level.  2.  Alcohol intoxication. - The patient will be placed on a banana bag daily. - Will be placed on as needed IV Ativan for alcohol withdrawal.  3.  Seizure disorder. - This could be possibly alcohol withdrawal seizures. - We will place him for now on as needed IV Ativan till he remember his seizures medication.  DVT prophylaxis: Lovenox, while checking his coagulation profile. Code Status: full code. Family Communication:  The plan of care was discussed in details with the patient (and no family members were available) I answered all questions. The patient agreed to proceed with the above mentioned plan. Further management will depend upon hospital course. Disposition Plan: Back to previous home environment Consults called: none. All the records are reviewed and case discussed with ED provider.  Status is: Observation  The patient remains OBS appropriate and will d/c before 2 midnights.  Dispo: The patient is from: Home              Anticipated d/c is to: Home              Patient currently is not medically stable to d/c.   Difficult to place patient No      TOTAL TIME TAKING CARE OF THIS PATIENT: 55 minutes.    Hannah Beat M.D on 09/28/2020 at 1:00 AM  [facility]   From 7 PM-7 AM, contact night-coverage [email or web address]  CC: Primary care physician; [facility] [place]

## 2020-09-28 NOTE — Discharge Summary (Signed)
Physician Discharge Summary  Brett Washington ZMO:[id number] DOB: [birth year] DOA: 09/27/2020  [facility]P: [facility]  Admit date: 09/27/2020 Discharge date: 09/28/2020  Admitted From: Home Disposition: Home  Recommendations for Outpatient Follow-up:  Follow up with PCP in 1-2 weeks   Home Health: No Equipment/Devices: None  Discharge Condition: Stable CODE STATUS: Full Diet recommendation: Regular  Brief/Interim Summary: 40 y.o. Hispanic male with medical history significant for alcohol abuse, seizure disorder and cardiac murmur, who presents emergency room with acute onset of altered mental status, being found minimally responsive out of a gas station.  It was still having mild confusion during his ER stay.  Patient was aggressively fluid resuscitated with improvement in mental status.  He is mentating clearly on day of discharge.  He is tolerating breakfast and lunch without nausea or vomiting.  He is ambulating without difficulty or unsteadiness.  I had a lengthy discussion with the patient regarding his reason for admission.  I explained that excessive alcohol intake well invariably lead to end-stage liver disease and cirrhosis and likely death if he does not change his habits.  I offered social work consultation for substance abuse resources as well as [facility].  Patient states that he does have a history of seizure disorder and is on medication although he cannot tell me the name.  He states that he sees a physician in [place] and that they provide his medications for free.  I encouraged him to seek follow-up with that provider soon as possible post discharge.  I also warned him to cut down on his alcohol intake.  He expressed understanding.  I did offer to call any family members for him however he declined.  He is discharged in stable condition.  Discharge Diagnoses:  Active Problems:   Hepatic encephalopathy (HCC)  Acute alcohol  intoxication Patient presented with alcohol level of 442 and encephalopathy as well as mild transaminitis.  Likely due to acute alcohol intoxication and result acute hepatitis.  Mental status improved after aggressive IV fluid resuscitation.  Counseled on alcohol cessation.  Patient will follow up with his established primary care providers in [place].  Discharge Instructions  Discharge Instructions     Diet - low sodium heart healthy   Complete by: As directed    Increase activity slowly   Complete by: As directed       Allergies as of 09/28/2020   No Known Allergies      Medication List    You have not been prescribed any medications.     No Known Allergies  Consultations: None   Procedures/Studies: CT Head Wo Contrast  Result Date: 09/27/2020 CLINICAL DATA:  Altered mental status, found unresponsive EXAM: CT HEAD WITHOUT CONTRAST TECHNIQUE: Contiguous axial images were obtained from the base of the skull through the vertex without intravenous contrast. COMPARISON:  None. FINDINGS: Brain: No evidence of acute infarction, hemorrhage, hydrocephalus, extra-axial collection, visible mass lesion or mass effect. Suspect a degree of parenchymal volume loss. Basal cisterns are patent. Midline intracranial structures are unremarkable. Cerebellar tonsils are normally positioned. Vascular: Insert brain vascular Skull: Right parietal scalp swelling and skin thickening. No subjacent calvarial fracture. No other acute or worrisome osseous abnormalities. Sinuses/Orbits: Paranasal sinuses and mastoid air cells are predominantly clear. Included orbital structures are unremarkable. Other: None IMPRESSION: Right parietal scalp swelling without subjacent calvarial fracture or acute intracranial abnormality. Electronically Signed   By: Kreg Shropshire M.D.   On: 09/27/2020 22:18   (Echo,  Carotid, EGD, Colonoscopy, ERCP)    Subjective: Seen and examined on the day of discharge.  Stable, no  distress.  Tolerating p.o. intake.  Ambulate without difficulty.  Stable for discharge home.  Discharge Exam: Vitals:   09/28/20 0527 09/28/20 1147  BP: 97/61 123/76  Pulse: 70 88  Resp: 18 16  Temp: 98.6 F (37 C) (!) 97.4 F (36.3 C)  SpO2: 100% 100%   Vitals:   09/28/20 0139 09/28/20 0201 09/28/20 0527 09/28/20 1147  BP: 104/70 120/75 97/61 123/76  Pulse: 63 68 70 88  Resp: 18 19 18 16   Temp:  97.6 F (36.4 C) 98.6 F (37 C) (!) 97.4 F (36.3 C)  TempSrc:  Oral    SpO2: 100% 100% 100% 100%  Weight:  78.8 kg    Height:  5\' 8"  (1.727 m)      General: Pt is alert, awake, not in acute distress Cardiovascular: RRR, S1/S2 +, no rubs, no gallops Respiratory: CTA bilaterally, no wheezing, no rhonchi Abdominal: Soft, NT, ND, bowel sounds + Extremities: no edema, no cyanosis    The results of significant diagnostics from this hospitalization (including imaging, microbiology, ancillary and laboratory) are listed below for reference.     Microbiology: Recent Results (from the past 240 hour(s))  Resp Panel by RT-PCR (Flu A&B, Covid) Nasopharyngeal Swab     Status: None   Collection Time: 09/27/20  9:52 PM   Specimen: Nasopharyngeal Swab; Nasopharyngeal(NP) swabs in vial transport medium  Result Value Ref Range Status   SARS Coronavirus 2 by RT PCR NEGATIVE NEGATIVE Final    Comment: (NOTE) SARS-CoV-2 target nucleic acids are NOT DETECTED.  The SARS-CoV-2 RNA is generally detectable in upper respiratory specimens during the acute phase of infection. The lowest concentration of SARS-CoV-2 viral copies this assay can detect is 138 copies/mL. A negative result does not preclude SARS-Cov-2 infection and should not be used as the sole basis for treatment or other patient management decisions. A negative result may occur with  improper specimen collection/handling, submission of specimen other than nasopharyngeal swab, presence of viral mutation(s) within the areas targeted by  this assay, and inadequate number of viral copies(<138 copies/mL). A negative result must be combined with clinical observations, patient history, and epidemiological information. The expected result is Negative.  Fact Sheet for Patients:  BloggerCourse.comhttps://www.fda.gov/media/152166/download  Fact Sheet for Healthcare Providers:  SeriousBroker.ithttps://www.fda.gov/media/152162/download  This test is no t yet approved or cleared by the [place] FDA and  has been authorized for detection and/or diagnosis of SARS-CoV-2 by FDA under an Emergency Use Authorization (EUA). This EUA will remain  in effect (meaning this test can be used) for the duration of the COVID-19 declaration under Section 564(b)(1) of the Act, 21 U.S.C.section 360bbb-3(b)(1), unless the authorization is terminated  or revoked sooner.       Influenza A by PCR NEGATIVE NEGATIVE Final   Influenza B by PCR NEGATIVE NEGATIVE Final    Comment: (NOTE) The Xpert Xpress SARS-CoV-2/FLU/RSV plus assay is intended as an aid in the diagnosis of influenza from Nasopharyngeal swab specimens and should not be used as a sole basis for treatment. Nasal washings and aspirates are unacceptable for Xpert Xpress SARS-CoV-2/FLU/RSV testing.  Fact Sheet for Patients: BloggerCourse.comhttps://www.fda.gov/media/152166/download  Fact Sheet for Healthcare Providers: SeriousBroker.ithttps://www.fda.gov/media/152162/download  This test is not yet approved or cleared by the [place] FDA and has been authorized for detection and/or diagnosis of SARS-CoV-2 by FDA under an Emergency Use Authorization (EUA). This EUA will remain in effect (  meaning this test can be used) for the duration of the COVID-19 declaration under Section 564(b)(1) of the Act, 21 U.S.C. section 360bbb-3(b)(1), unless the authorization is terminated or revoked.  Performed at [facility], [address] [place], [place] [address]      Labs: BNP (last 3 results) No results for input(s): BNP in the last  8760 hours. Basic Metabolic Panel: Recent Labs  Lab 09/27/20 2152 09/28/20 0342  NA 141 143  K 3.2* 2.9*  CL 108 113*  CO2 25 25  GLUCOSE 112* 87  BUN 5* <5*  CREATININE 0.42* 0.30*  CALCIUM 7.9* 7.6*  MG  --  1.9  PHOS  --  4.2   Liver Function Tests: Recent Labs  Lab 09/27/20 2152 09/28/20 0342  AST 104* 95*  ALT 51* 48*  ALKPHOS 149* 135*  BILITOT 2.8* 2.6*  PROT 6.6 5.9*  ALBUMIN 3.0* 2.6*   No results for input(s): LIPASE, AMYLASE in the last 168 hours. Recent Labs  Lab 09/27/20 2152  AMMONIA 69*   CBC: Recent Labs  Lab 09/27/20 2152 09/28/20 0342  WBC 5.4 4.6  NEUTROABS 2.0  --   HGB 9.8* 9.5*  HCT 30.0* 29.3*  MCV 82.6 83.2  PLT 116* 103*   Cardiac Enzymes: No results for input(s): CKTOTAL, CKMB, CKMBINDEX, TROPONINI in the last 168 hours. BNP: Invalid input(s): POCBNP CBG: No results for input(s): GLUCAP in the last 168 hours. D-Dimer No results for input(s): DDIMER in the last 72 hours. Hgb A1c No results for input(s): HGBA1C in the last 72 hours. Lipid Profile No results for input(s): CHOL, HDL, LDLCALC, TRIG, CHOLHDL, LDLDIRECT in the last 72 hours. Thyroid function studies No results for input(s): TSH, T4TOTAL, T3FREE, THYROIDAB in the last 72 hours.  Invalid input(s): FREET3 Anemia work up No results for input(s): VITAMINB12, FOLATE, FERRITIN, TIBC, IRON, RETICCTPCT in the last 72 hours. Urinalysis No results found for: COLORURINE, APPEARANCEUR, LABSPEC, PHURINE, GLUCOSEU, HGBUR, BILIRUBINUR, KETONESUR, PROTEINUR, UROBILINOGEN, NITRITE, LEUKOCYTESUR Sepsis Labs Invalid input(s): PROCALCITONIN,  WBC,  LACTICIDVEN Microbiology Recent Results (from the past 240 hour(s))  Resp Panel by RT-PCR (Flu A&B, Covid) Nasopharyngeal Swab     Status: None   Collection Time: 09/27/20  9:52 PM   Specimen: Nasopharyngeal Swab; Nasopharyngeal(NP) swabs in vial transport medium  Result Value Ref Range Status   SARS Coronavirus 2 by RT PCR NEGATIVE  NEGATIVE Final    Comment: (NOTE) SARS-CoV-2 target nucleic acids are NOT DETECTED.  The SARS-CoV-2 RNA is generally detectable in upper respiratory specimens during the acute phase of infection. The lowest concentration of SARS-CoV-2 viral copies this assay can detect is 138 copies/mL. A negative result does not preclude SARS-Cov-2 infection and should not be used as the sole basis for treatment or other patient management decisions. A negative result may occur with  improper specimen collection/handling, submission of specimen other than nasopharyngeal swab, presence of viral mutation(s) within the areas targeted by this assay, and inadequate number of viral copies(<138 copies/mL). A negative result must be combined with clinical observations, patient history, and epidemiological information. The expected result is Negative.  Fact Sheet for Patients:  BloggerCourse.com  Fact Sheet for Healthcare Providers:  SeriousBroker.it  This test is no t yet approved or cleared by the [place] FDA and  has been authorized for detection and/or diagnosis of SARS-CoV-2 by FDA under an Emergency Use Authorization (EUA). This EUA will remain  in effect (meaning this test can be used) for the duration of the  COVID-19 declaration under Section 564(b)(1) of the Act, 21 U.S.C.section 360bbb-3(b)(1), unless the authorization is terminated  or revoked sooner.       Influenza A by PCR NEGATIVE NEGATIVE Final   Influenza B by PCR NEGATIVE NEGATIVE Final    Comment: (NOTE) The Xpert Xpress SARS-CoV-2/FLU/RSV plus assay is intended as an aid in the diagnosis of influenza from Nasopharyngeal swab specimens and should not be used as a sole basis for treatment. Nasal washings and aspirates are unacceptable for Xpert Xpress SARS-CoV-2/FLU/RSV testing.  Fact Sheet for Patients: BloggerCourse.com  Fact Sheet for Healthcare  Providers: SeriousBroker.it  This test is not yet approved or cleared by the [place] FDA and has been authorized for detection and/or diagnosis of SARS-CoV-2 by FDA under an Emergency Use Authorization (EUA). This EUA will remain in effect (meaning this test can be used) for the duration of the COVID-19 declaration under Section 564(b)(1) of the Act, 21 U.S.C. section 360bbb-3(b)(1), unless the authorization is terminated or revoked.  Performed at [facility], [address], [place], [place] [address]      Time coordinating discharge: Over 30 minutes  SIGNED:   Tresa Moore, MD  [facility] 09/28/2020, 1:37 PM Pager   If 7PM-7AM, please contact night-coverage

## 2021-01-02 ENCOUNTER — Emergency Department: Payer: Self-pay

## 2021-01-02 ENCOUNTER — Other Ambulatory Visit: Payer: Self-pay

## 2021-01-02 ENCOUNTER — Emergency Department
Admission: EM | Admit: 2021-01-02 | Discharge: 2021-01-02 | Disposition: A | Discharge status: Home / Self Care | Payer: Self-pay | Attending: Emergency Medicine | Admitting: Emergency Medicine

## 2021-01-02 DIAGNOSIS — F1010 Alcohol abuse, uncomplicated: Secondary | ICD-10-CM | POA: Insufficient documentation

## 2021-01-02 DIAGNOSIS — R569 Unspecified convulsions: Secondary | ICD-10-CM | POA: Insufficient documentation

## 2021-01-02 DIAGNOSIS — Z20822 Contact with and (suspected) exposure to covid-19: Secondary | ICD-10-CM | POA: Insufficient documentation

## 2021-01-02 DIAGNOSIS — Y908 Blood alcohol level of 240 mg/100 ml or more: Secondary | ICD-10-CM | POA: Insufficient documentation

## 2021-01-02 DIAGNOSIS — E7220 Disorder of urea cycle metabolism, unspecified: Secondary | ICD-10-CM | POA: Insufficient documentation

## 2021-01-02 DIAGNOSIS — R519 Headache, unspecified: Secondary | ICD-10-CM | POA: Insufficient documentation

## 2021-01-02 DIAGNOSIS — Z8616 Personal history of COVID-19: Secondary | ICD-10-CM | POA: Insufficient documentation

## 2021-01-02 DIAGNOSIS — R791 Abnormal coagulation profile: Secondary | ICD-10-CM | POA: Insufficient documentation

## 2021-01-02 LAB — CBC WITH DIFFERENTIAL/PLATELET
Abs Immature Granulocytes: 0.02 10*3/uL (ref 0.00–0.07)
Basophils Absolute: 0.1 10*3/uL (ref 0.0–0.1)
Basophils Relative: 1 %
Eosinophils Absolute: 0.1 10*3/uL (ref 0.0–0.5)
Eosinophils Relative: 2 %
HCT: 25.8 % — ABNORMAL LOW (ref 39.0–52.0)
Hemoglobin: 8.3 g/dL — ABNORMAL LOW (ref 13.0–17.0)
Immature Granulocytes: 0 %
Lymphocytes Relative: 41 %
Lymphs Abs: 2.3 10*3/uL (ref 0.7–4.0)
MCH: 31.4 pg (ref 26.0–34.0)
MCHC: 32.2 g/dL (ref 30.0–36.0)
MCV: 97.7 fL (ref 80.0–100.0)
Monocytes Absolute: 0.3 10*3/uL (ref 0.1–1.0)
Monocytes Relative: 6 %
Neutro Abs: 2.7 10*3/uL (ref 1.7–7.7)
Neutrophils Relative %: 50 %
Platelets: 159 10*3/uL (ref 150–400)
RBC: 2.64 MIL/uL — ABNORMAL LOW (ref 4.22–5.81)
RDW: 20.1 % — ABNORMAL HIGH (ref 11.5–15.5)
WBC: 5.5 10*3/uL (ref 4.0–10.5)
nRBC: 0 % (ref 0.0–0.2)

## 2021-01-02 LAB — URINE DRUG SCREEN, QUALITATIVE (ARMC ONLY)
Amphetamines, Ur Screen: NOT DETECTED
Barbiturates, Ur Screen: NOT DETECTED
Benzodiazepine, Ur Scrn: NOT DETECTED
Cannabinoid 50 Ng, Ur ~~LOC~~: NOT DETECTED
Cocaine Metabolite,Ur ~~LOC~~: NOT DETECTED
MDMA (Ecstasy)Ur Screen: NOT DETECTED
Methadone Scn, Ur: NOT DETECTED
Opiate, Ur Screen: NOT DETECTED
Phencyclidine (PCP) Ur S: NOT DETECTED
Tricyclic, Ur Screen: NOT DETECTED

## 2021-01-02 LAB — COMPREHENSIVE METABOLIC PANEL
ALT: 42 U/L (ref 0–44)
AST: 160 U/L — ABNORMAL HIGH (ref 15–41)
Albumin: 2.6 g/dL — ABNORMAL LOW (ref 3.5–5.0)
Alkaline Phosphatase: 154 U/L — ABNORMAL HIGH (ref 38–126)
Anion gap: 8 (ref 5–15)
BUN: <5 mg/dL — ABNORMAL LOW (ref 6–20)
CO2: 24 mmol/L (ref 22–32)
Calcium: 8 mg/dL — ABNORMAL LOW (ref 8.9–10.3)
Chloride: 112 mmol/L — ABNORMAL HIGH (ref 98–111)
Creatinine, Ser: 0.37 mg/dL — ABNORMAL LOW (ref 0.61–1.24)
GFR, Estimated: >60 mL/min (ref >60)
Glucose, Bld: 91 mg/dL (ref 70–99)
Potassium: 3.2 mmol/L — ABNORMAL LOW (ref 3.5–5.1)
Sodium: 144 mmol/L (ref 135–145)
Total Bilirubin: 4.5 mg/dL — ABNORMAL HIGH (ref 0.3–1.2)
Total Protein: 6.6 g/dL (ref 6.5–8.1)

## 2021-01-02 LAB — URINALYSIS, ROUTINE W REFLEX MICROSCOPIC
Bilirubin Urine: NEGATIVE
Glucose, UA: NEGATIVE mg/dL
Hgb urine dipstick: NEGATIVE
Ketones, ur: NEGATIVE mg/dL
Leukocytes,Ua: NEGATIVE
Nitrite: NEGATIVE
Protein, ur: NEGATIVE mg/dL
Specific Gravity, Urine: 1.01 (ref 1.005–1.030)
pH: 7 (ref 5.0–8.0)

## 2021-01-02 LAB — SALICYLATE LEVEL: Salicylate Lvl: <7 mg/dL — ABNORMAL LOW (ref 7.0–30.0)

## 2021-01-02 LAB — AMMONIA: Ammonia: 57 umol/L — ABNORMAL HIGH (ref 9–35)

## 2021-01-02 LAB — CK: Total CK: 197 U/L (ref 49–397)

## 2021-01-02 LAB — MAGNESIUM: Magnesium: 1.8 mg/dL (ref 1.7–2.4)

## 2021-01-02 LAB — ACETAMINOPHEN LEVEL: Acetaminophen (Tylenol), Serum: <10 ug/mL — ABNORMAL LOW (ref 10–30)

## 2021-01-02 LAB — PROTIME-INR
INR: 1.6 — ABNORMAL HIGH (ref 0.8–1.2)
Prothrombin Time: 19 seconds — ABNORMAL HIGH (ref 11.4–15.2)

## 2021-01-02 LAB — ETHANOL: Alcohol, Ethyl (B): 427 mg/dL (ref <10)

## 2021-01-02 LAB — RESP PANEL BY RT-PCR (FLU A&B, COVID) ARPGX2
Influenza A by PCR: NEGATIVE
Influenza B by PCR: NEGATIVE
SARS Coronavirus 2 by RT PCR: NEGATIVE

## 2021-01-02 MED ORDER — LEVETIRACETAM IN NACL 1000 MG/100ML IV SOLN
1000.0000 mg | Freq: Once | INTRAVENOUS | Status: AC
  Administered 2021-01-02: 1000 mg via INTRAVENOUS
  Filled 2021-01-02: qty 100

## 2021-01-02 MED ORDER — LACTULOSE 10 GM/15ML PO SOLN
20.0000 g | Freq: Once | ORAL | Status: AC
  Administered 2021-01-02: 20 g via ORAL
  Filled 2021-01-02: qty 30

## 2021-01-02 MED ORDER — SODIUM CHLORIDE 0.9 % IV BOLUS
1000.0000 mL | Freq: Once | INTRAVENOUS | Status: AC
  Administered 2021-01-02: 1000 mL via INTRAVENOUS

## 2021-01-02 MED ORDER — POTASSIUM CHLORIDE CRYS ER 20 MEQ PO TBCR
40.0000 meq | EXTENDED_RELEASE_TABLET | Freq: Once | ORAL | Status: AC
  Administered 2021-01-02: 40 meq via ORAL
  Filled 2021-01-02: qty 2

## 2021-01-02 NOTE — ED Notes (Signed)
Pt contacted his Mom for a ride home.

## 2021-01-02 NOTE — ED Notes (Signed)
Lab called for add on 

## 2021-01-02 NOTE — Discharge Instructions (Addendum)
Is important that he takes his seizure medicine to prevent seizures as well as all of his medications for his liver.  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  He needs to stop drinking alcohol when he is ready it would be best to do this at a facility to help give him medications to help stop to help prevent withdrawal seizures.

## 2021-01-02 NOTE — ED Triage Notes (Signed)
Patient coming [facility] from side of road for seizures. Patient had 2 seizures witnessed by stranger, reports one prior to those two seizures. Patient reports ETOH on board, and that he did not take seizure meds due to ETOH ingestion.

## 2021-01-02 NOTE — ED Provider Notes (Signed)
[facility] Emergency Department Provider Note  ____________________________________________   Event Date/Time   First MD Initiated Contact with Patient 01/02/21 1727     (approximate)  I have reviewed the triage vital signs and the nursing notes.   HISTORY  Chief Complaint Seizures    HPI Brett Washington is a 40 y.o. male with history of alcohol abuse and seizure history on Keppra who comes in with concern for seizures.  Patient reportedly has had 3 seizures.  First seizure was earlier this morning.  Second seizure was witnessed by somebody outside and then had another seizure witnessed by that same person.  They each lasted less than a minute, full body tonic jerking.  Did fall and hit his head on the right side.  Patient reporting a headache on the right side, constant, nothing makes it better or worse.  States that he has been drinking a lot of alcohol.  Last drink 6 hours ago.  States that because he was drinking he did not take his seizure medication.  Unclear what seizure med he is on but on review of records it appears that it is Keppra.  Tdap April 2022             Past Medical History:  Diagnosis Date   Alcohol abuse    Heart murmur    Seizures (HCC)     Patient Active Problem List   Diagnosis Date Noted   Hepatic encephalopathy (HCC) 09/27/2020   Elevated bilirubin    Full code status    Advanced directives, counseling/discussion    Goals of care, counseling/discussion    Palliative care by specialist    Hematemesis 11/01/2019   Acute hepatic encephalopathy 11/01/2019   Leukocytosis 11/01/2019   Abnormal LFTs 11/01/2019   Seizure (HCC) 11/01/2019   Alcohol abuse 11/01/2019   Hepatic encephalopathy (HCC)    Hypothermia 06/02/2019   Hypothermia due to cold environment 06/02/2019   Alcohol intoxication with delirium (HCC) 06/02/2019   COVID-19 virus infection 06/02/2019   LOC (loss of consciousness) (HCC) 01/11/2017   Alcohol  use disorder, severe, dependence (HCC) 11/06/2016   Aspiration pneumonia due to vomit (HCC)    Alcohol withdrawal seizure (HCC) 10/31/2016   Thrombocytopenia (HCC) 10/31/2016   Transaminitis 10/31/2016   Hypokalemia 10/31/2016    Past Surgical History:  Procedure Laterality Date   ESOPHAGOGASTRODUODENOSCOPY (EGD) WITH PROPOFOL N/A 11/02/2019   Procedure: ESOPHAGOGASTRODUODENOSCOPY (EGD) WITH PROPOFOL;  Surgeon: Toney Reil, MD;  Location: [facility] ENDOSCOPY;  Service: Gastroenterology;  Laterality: N/A;   HAND SURGERY      Prior to Admission medications   Medication Sig Start Date End Date Taking? Authorizing Provider  gabapentin (NEURONTIN) 600 MG tablet Take 0.5 tablets (300 mg total) by mouth at bedtime. 11/09/19 12/09/19  Dorcas Carrow, MD  lactulose (CHRONULAC) 10 GM/15ML solution Take 30 mLs (20 g total) by mouth 2 (two) times daily as needed for mild constipation (to make 2-3 loose BM a day). 11/09/19   Dorcas Carrow, MD  levETIRAcetam (KEPPRA) 1000 MG tablet Take 1 tablet (1,000 mg total) by mouth 2 (two) times daily. 11/09/19 12/09/19  Dorcas Carrow, MD  pantoprazole (PROTONIX) 40 MG tablet Take 1 tablet (40 mg total) by mouth 2 (two) times daily. 11/09/19 12/09/19  Dorcas Carrow, MD  potassium chloride SA (KLOR-CON) 20 MEQ tablet Take 1 tablet (20 mEq total) by mouth daily for 7 days. 11/09/19 11/16/19  Dorcas Carrow, MD  rifaximin (XIFAXAN) 550 MG TABS tablet Take 1 tablet (550  mg total) by mouth 2 (two) times daily. 11/09/19   Dorcas Carrow, MD    Allergies Patient has no known allergies.  Family History  Problem Relation Age of Onset   Diabetes Mellitus II Mother     Social History Social History   Tobacco Use   Smoking status: Never   Smokeless tobacco: Never  Vaping Use   Vaping Use: Never used  Substance Use Topics   Alcohol use: Yes    Alcohol/week: 56.0 standard drinks    Types: 56 Cans of beer per week   Drug use: No      Review of  Systems Constitutional: No fever/chills Eyes: No visual changes. ENT: No sore throat. Cardiovascular: Denies chest pain. Respiratory: Denies shortness of breath. Gastrointestinal: No abdominal pain.  No nausea, no vomiting.  No diarrhea.  No constipation. Genitourinary: Negative for dysuria. Musculoskeletal: Negative for back pain. Skin: Negative for rash. Neurological: Positive headache, no focal weakness or numbness.  Positive seizure All other ROS negative ____________________________________________   PHYSICAL EXAM:  VITAL SIGNS: Height 5\' 8"  (1.727 m), weight 78.8 kg.   Constitutional: Alert and oriented.  Appears disheveled Eyes: Conjunctivae are normal. EOMI. Head: Some bruising on the right head Nose: No congestion/rhinnorhea. Mouth/Throat: Mucous membranes are moist.   Neck: No stridor. Trachea Midline. FROM Cardiovascular: Normal rate, regular rhythm. Grossly normal heart sounds.  Good peripheral circulation. Respiratory: Normal respiratory effort.  No retractions. Lungs CTAB. Gastrointestinal: Soft and nontender. No distention. No abdominal bruits.  Musculoskeletal: No lower extremity tenderness nor edema.  No joint effusions.  Some old bruising on the right arm but full range of motion. Neurologic:  Normal speech and language. No gross focal neurologic deficits are appreciated.  Skin:  Skin is warm, dry and intact. No rash noted. Psychiatric: Mood and affect are normal. Speech and behavior are normal. GU: Deferred   ____________________________________________   LABS (all labs ordered are listed, but only abnormal results are displayed)  Labs Reviewed  CBC WITH DIFFERENTIAL/PLATELET - Abnormal; Notable for the following components:      Result Value   RBC 2.64 (*)    Hemoglobin 8.3 (*)    HCT 25.8 (*)    RDW 20.1 (*)    All other components within normal limits  COMPREHENSIVE METABOLIC PANEL - Abnormal; Notable for the following components:   Potassium  3.2 (*)    Chloride 112 (*)    BUN <5 (*)    Creatinine, Ser 0.37 (*)    Calcium 8.0 (*)    Albumin 2.6 (*)    AST 160 (*)    Alkaline Phosphatase 154 (*)    Total Bilirubin 4.5 (*)    All other components within normal limits  AMMONIA - Abnormal; Notable for the following components:   Ammonia 57 (*)    All other components within normal limits  ETHANOL - Abnormal; Notable for the following components:   Alcohol, Ethyl (B) 427 (*)    All other components within normal limits  SALICYLATE LEVEL - Abnormal; Notable for the following components:   Salicylate Lvl <7.0 (*)    All other components within normal limits  ACETAMINOPHEN LEVEL - Abnormal; Notable for the following components:   Acetaminophen (Tylenol), Serum <10 (*)    All other components within normal limits  RESP PANEL BY RT-PCR (FLU A&B, COVID) ARPGX2  CK  URINALYSIS, ROUTINE W REFLEX MICROSCOPIC  URINE DRUG SCREEN, QUALITATIVE ([facility] ONLY)  MAGNESIUM   ____________________________________________   ED ECG REPORT I,  Concha Se, the attending physician, personally viewed and interpreted this ECG.  Normal sinus rate of 81, no ST elevation, no T wave inversions except for aVL, normal intervals except for slightly prolonged QTC at 502 ____________________________________________  RADIOLOGY I, Concha Se, personally viewed and evaluated these images (plain radiographs) as part of my medical decision making, as well as reviewing the written report by the radiologist.  ED MD interpretation: No pneumonia   Official radiology report(s): CT HEAD WO CONTRAST ( )  Result Date: 01/02/2021 CLINICAL DATA:  Seizures. EXAM: CT HEAD WITHOUT CONTRAST TECHNIQUE: Contiguous axial images were obtained from the base of the skull through the vertex without intravenous contrast. COMPARISON:  August 08, 2020 FINDINGS: Brain: No evidence of acute infarction, hemorrhage, hydrocephalus, extra-axial collection or mass lesion/mass  effect. Mild prominence of the ventricles and sulci is again seen for the patient's age. Vascular: No hyperdense vessel or unexpected calcification. Skull: Normal. Negative for fracture or focal lesion. Sinuses/Orbits: No acute finding. Other: Very mild right frontal scalp soft tissue swelling is seen. IMPRESSION: 1. No acute intracranial process. 2. Very mild right frontal scalp soft tissue swelling. Electronically Signed   By: Aram Candela M.D.   On: 01/02/2021 19:11   CT Cervical Spine Wo Contrast  Result Date: 01/02/2021 CLINICAL DATA:  Status post seizure. EXAM: CT CERVICAL SPINE WITHOUT CONTRAST TECHNIQUE: Multidetector CT imaging of the cervical spine was performed without intravenous contrast. Multiplanar CT image reconstructions were also generated. COMPARISON:  August 08, 2020 FINDINGS: Alignment: Normal. Skull base and vertebrae: No acute fracture. No primary bone lesion or focal pathologic process. Soft tissues and spinal canal: No prevertebral fluid or swelling. No visible canal hematoma. Disc levels: Very mild anterior osteophyte formation is seen at the level of C5-C6. Normal multilevel intervertebral disc space narrowing is seen. Normal, bilateral multilevel facet joints are noted. Upper chest: Negative. Other: None. IMPRESSION: 1. No acute fracture or subluxation of the cervical spine. Electronically Signed   By: Aram Candela M.D.   On: 01/02/2021 19:14   DG Chest Portable 1 View  Result Date: 01/02/2021 CLINICAL DATA:  Status post seizure. EXAM: PORTABLE CHEST 1 VIEW COMPARISON:  November 01, 2019 FINDINGS: Decreased lung volumes are seen with subsequent crowding of the bronchovascular lung markings. Mild areas of atelectasis are also noted within the bilateral lung bases. There is no evidence of a pleural effusion or pneumothorax. The heart size and mediastinal contours are within normal limits. The visualized skeletal structures are unremarkable. IMPRESSION: Low lung volumes with  mild bibasilar atelectasis. Electronically Signed   By: Aram Candela M.D.   On: 01/02/2021 19:06    ____________________________________________   PROCEDURES  Procedure(s) performed (including Critical Care):  .1-3 Lead EKG Interpretation Performed by: Concha Se, MD Authorized by: Concha Se, MD     Interpretation: normal     ECG rate:  70s   ECG rate assessment: normal     Rhythm: sinus rhythm     Ectopy: none     Conduction: normal     ____________________________________________   INITIAL IMPRESSION / ASSESSMENT AND PLAN / ED COURSE  Brett Washington was evaluated in Emergency Department on 01/02/2021 for the symptoms described in the history of present illness. He was evaluated in the context of the global COVID-19 pandemic, which necessitated consideration that the patient might be at risk for infection with the SARS-CoV-2 virus that causes COVID-19. Institutional protocols and algorithms that pertain to the evaluation of patients at risk for  COVID-19 are in a state of rapid change based on information released by regulatory bodies including the CDC and federal and state organizations. These policies and algorithms were followed during the patient's care in the ED.     Patient comes in with concern for recurrent seizures in the setting of EtOH use and not taking his medication.  We will give 1 g of Keppra and 1 L of fluid.  Patient already got 500 cc of fluid from EMS.  Labs ordered to evaluate for Electra abnormalities, AKI, rhabdo, UTI.  Add on CT head, CT cervical evaluate for any intracranial hemorrhage, cervical fracture.  We will keep patient the cardiac monitor.   7:31 PM  Labs are reassuring with normal magnesium.  Hemoglobin slightly low but count of around his baseline at 8.3.  His potassium is low at 3.2 will give some oral repletion.  His LFTs are elevated with a T bili at 4.5.  Most likely from his alcohol use.  He is nontender in his abdomen.  His CK level  is negative.  His ammonia level slightly elevated so give him a dose of lactulose but his alcohol level is in the 400s so we will need to monitor patient make sure he does not have another seizure.  Patient was given 1 g of IV Keppra and has not had any seizures at this time   Offered patient admission for helping to stop drinking alcohol given history of withdrawal as well as to monitor his hemoglobin.  Patient declined.  Patient's hemoglobin was slightly lower than baseline but he is no melena on exam and low suspicion for acute GI bleed.  At this time patient's not interested in stopping drinking alcohol.  His wife is come to pick him up.  I again explained to the wife about my concern that over time that he is causing some to have liver failure and she understands and states that they have taken him multiple times to different places to try to stop drinking but he only starts drinking again.  At this time they understand patient has severe liver damage from his drinking and that he will die if he does not stop drinking but I do not want prescribed Librium given he has a history of mixing them with the alcohol and at this time he plans to cotninue to drink.   Ammonia was slightly elevated but patient is not altered and is understanding everything you are saying.  No longer appears intoxicated.  He has a ride home.  I did give a dose of lactulose just given he is not taking his medications at home.   Patient has additional seizure medicine at home but just refuses to take them per wife.       ____________________________________________   FINAL CLINICAL IMPRESSION(S) / ED DIAGNOSES   Final diagnoses:  Seizure (HCC)  ETOH abuse      MEDICATIONS GIVEN DURING THIS VISIT:  Medications  sodium chloride 0.9 % bolus 1,000 mL (0 mLs Intravenous Stopped 01/02/21 1954)  levETIRAcetam (KEPPRA) IVPB 1000 mg/100 mL premix (0 mg Intravenous Stopped 01/02/21 1826)  potassium chloride SA (KLOR-CON) CR  tablet 40 mEq (40 mEq Oral Given 01/02/21 1919)  lactulose (CHRONULAC) 10 GM/15ML solution 20 g (20 g Oral Given 01/02/21 1919)  sodium chloride 0.9 % bolus 1,000 mL (1,000 mLs Intravenous New Bag/Given 01/02/21 2202)     ED Discharge Orders     None        Note:  This  document was prepared using Conservation officer, historic buildings and may include unintentional dictation errors.    Concha Se, MD 01/02/21 2253

## 2021-01-02 NOTE — ED Notes (Signed)
E-signature pad unavailable - Pt verbalized understanding of D/C information - no additional concerns at this time.  

## 2021-03-13 ENCOUNTER — Encounter: Payer: Self-pay | Admitting: Emergency Medicine

## 2021-03-13 ENCOUNTER — Emergency Department
Admission: EM | Admit: 2021-03-13 | Discharge: 2021-03-13 | Disposition: A | Discharge status: Home / Self Care | Payer: Self-pay | Attending: Emergency Medicine | Admitting: Emergency Medicine

## 2021-03-13 DIAGNOSIS — F10920 Alcohol use, unspecified with intoxication, uncomplicated: Secondary | ICD-10-CM

## 2021-03-13 DIAGNOSIS — Z8616 Personal history of COVID-19: Secondary | ICD-10-CM | POA: Insufficient documentation

## 2021-03-13 DIAGNOSIS — F10129 Alcohol abuse with intoxication, unspecified: Secondary | ICD-10-CM | POA: Insufficient documentation

## 2021-03-13 LAB — CBG MONITORING, ED: Glucose-Capillary: 125 mg/dL — ABNORMAL HIGH (ref 70–99)

## 2021-03-13 NOTE — ED Triage Notes (Signed)
Pt arrived via [facility] from the local [facility] where pt was found coming from bathroom with pants down and intoxicated. Pt admits to ETOH use and sts to MD, he is an alcoholic. Pt able to answer all questions but drowsy when not engaging in conversation.

## 2021-03-13 NOTE — ED Provider Notes (Signed)
[facility]  ____________________________________________   Event Date/Time   First MD Initiated Contact with Patient 03/13/21 1937     (approximate)  I have reviewed the triage vital signs and the nursing notes.   HISTORY  Chief Complaint Alcohol Intoxication    HPI Brett Washington is a 40 y.o. male with past medical history of alcohol use disorder, seizures who presents with alcohol intoxication.  Per medics the patient was in [place] walking around with his pants at his feet and so security called them.  The patient admits to drinking beer tonight.  He denies any acute complaints denies abdominal pain nausea vomiting or headache.  He does drink daily about 4-5 beers.  Has had a history of withdrawal.  Lives with his mother.         Past Medical History:  Diagnosis Date   Alcohol abuse    Heart murmur    Seizures (HCC)     Patient Active Problem List   Diagnosis Date Noted   Hepatic encephalopathy 09/27/2020   Elevated bilirubin    Full code status    Advanced directives, counseling/discussion    Goals of care, counseling/discussion    Palliative care by specialist    Hematemesis 11/01/2019   Acute hepatic encephalopathy 11/01/2019   Leukocytosis 11/01/2019   Abnormal LFTs 11/01/2019   Seizure (HCC) 11/01/2019   Alcohol abuse 11/01/2019   Hepatic encephalopathy    Hypothermia 06/02/2019   Hypothermia due to cold environment 06/02/2019   Alcohol intoxication with delirium (HCC) 06/02/2019   COVID-19 virus infection 06/02/2019   LOC (loss of consciousness) (HCC) 01/11/2017   Alcohol use disorder, severe, dependence (HCC) 11/06/2016   Aspiration pneumonia due to vomit (HCC)    Alcohol withdrawal seizure (HCC) 10/31/2016   Thrombocytopenia (HCC) 10/31/2016   Transaminitis 10/31/2016   Hypokalemia 10/31/2016    Past Surgical History:  Procedure Laterality Date   ESOPHAGOGASTRODUODENOSCOPY (EGD) WITH PROPOFOL N/A 11/02/2019   Procedure:  ESOPHAGOGASTRODUODENOSCOPY (EGD) WITH PROPOFOL;  Surgeon: Toney Reil, MD;  Location: [facility] ENDOSCOPY;  Service: Gastroenterology;  Laterality: N/A;   HAND SURGERY      Prior to Admission medications   Medication Sig Start Date End Date Taking? Authorizing Provider  gabapentin (NEURONTIN) 600 MG tablet Take 0.5 tablets (300 mg total) by mouth at bedtime. 11/09/19 12/09/19  Dorcas Carrow, MD  lactulose (CHRONULAC) 10 GM/15ML solution Take 30 mLs (20 g total) by mouth 2 (two) times daily as needed for mild constipation (to make 2-3 loose BM a day). 11/09/19   Dorcas Carrow, MD  levETIRAcetam (KEPPRA) 1000 MG tablet Take 1 tablet (1,000 mg total) by mouth 2 (two) times daily. 11/09/19 12/09/19  Dorcas Carrow, MD  pantoprazole (PROTONIX) 40 MG tablet Take 1 tablet (40 mg total) by mouth 2 (two) times daily. 11/09/19 12/09/19  Dorcas Carrow, MD  potassium chloride SA (KLOR-CON) 20 MEQ tablet Take 1 tablet (20 mEq total) by mouth daily for 7 days. 11/09/19 11/16/19  Dorcas Carrow, MD  rifaximin (XIFAXAN) 550 MG TABS tablet Take 1 tablet (550 mg total) by mouth 2 (two) times daily. 11/09/19   Dorcas Carrow, MD    Allergies Patient has no known allergies.  Family History  Problem Relation Age of Onset   Diabetes Mellitus II Mother     Social History Social History   Tobacco Use   Smoking status: Never   Smokeless tobacco: Never  Vaping Use   Vaping Use: Never used  Substance Use Topics  Alcohol use: Yes    Alcohol/week: 56.0 standard drinks    Types: 56 Cans of beer per week   Drug use: No    Review of Systems   Review of Systems  Constitutional:  Negative for chills and fever.  Respiratory:  Negative for shortness of breath.   Cardiovascular:  Negative for chest pain.  Gastrointestinal:  Negative for abdominal pain.  Neurological:  Negative for headaches.  All other systems reviewed and are negative.  Physical Exam Updated Vital Signs BP 122/82 (BP Location: Left Arm)    Pulse (!) 101   Temp 97.8 F (36.6 C) (Oral)   Resp 16   Ht 5\' 8"  (1.727 m)   Wt 79 kg   SpO2 97%   BMI 26.48 kg/m   Physical Exam Vitals and nursing note reviewed.  Constitutional:      General: He is not in acute distress.    Appearance: Normal appearance.  HENT:     Head: Normocephalic and atraumatic.  Eyes:     General: No scleral icterus.    Conjunctiva/sclera: Conjunctivae normal.  Pulmonary:     Effort: Pulmonary effort is normal. No respiratory distress.     Breath sounds: Normal breath sounds. No wheezing.  Abdominal:     General: Abdomen is flat. There is no distension.     Palpations: Abdomen is soft.     Tenderness: There is no abdominal tenderness. There is no guarding.  Musculoskeletal:        General: No deformity or signs of injury.     Cervical back: Normal range of motion.  Skin:    Coloration: Skin is not jaundiced or pale.  Neurological:     General: No focal deficit present.     Mental Status: He is alert and oriented to person, place, and time. Mental status is at baseline.     Comments: Patient with slurred speech, appears intoxicated  Psychiatric:        Mood and Affect: Mood normal.        Behavior: Behavior normal.     Comments: Negative suicidal ideation     LABS (all labs ordered are listed, but only abnormal results are displayed)  Labs Reviewed  CBG MONITORING, ED - Abnormal; Notable for the following components:      Result Value   Glucose-Capillary 125 (*)    All other components within normal limits   ____________________________________________  EKG  N/a ____________________________________________  RADIOLOGY , personally viewed and evaluated these images (plain radiographs) as part of my medical decision making, as well as reviewing the written report by the radiologist.  ED MD interpretation:  n/a    ____________________________________________   PROCEDURES  Procedure(s) performed (including  Critical Care):  Procedures   ____________________________________________   INITIAL IMPRESSION / ASSESSMENT AND PLAN / ED COURSE     40 year old man with known alcohol use disorder presents with alcohol intoxication.  He is able to tell me that he was drinking beer.  Vital signs are within normal limits.  He does appear intoxicated but is alert and oriented and overall appropriate.  He has no focal complaints at this time.  Blood sugar normal.  He was observed for period of time in the emergency department until clinically sober enough to be discharged with a ride.  He was able to ambulate and tolerate p.o.  Clinical Course as of 03/14/21 0034  Tue Mar 13, 2021  2003 Glucose-Capillary(!): 125 [KM]    Clinical Course  User Index [KM] Georga Hacking, MD     ____________________________________________   FINAL CLINICAL IMPRESSION(S) / ED DIAGNOSES  Final diagnoses:  Alcoholic intoxication without complication [facility])     ED Discharge Orders     None        Note:  This document was prepared using Dragon voice recognition software and may include unintentional dictation errors.    Georga Hacking, MD 03/14/21 [id number]

## 2021-03-13 NOTE — ED Notes (Signed)
Pt ambulatory to bathroom at this time.  Pt states he is currently feeling better.

## 2021-03-13 NOTE — ED Notes (Signed)
Pt able to get in contact with his s/o, who will come and pick him up shortly from ER.  Pt steadily ambulated to lobby at this time with RN.

## 2021-03-14 ENCOUNTER — Emergency Department: Admission: EM | Admit: 2021-03-14 | Discharge: 2021-03-14 | Discharge status: Against Medical Advice | Payer: Self-pay

## 2021-03-14 NOTE — ED Triage Notes (Signed)
Pt comes into the ED via EMS from [place], pt was sleeping with his pants down so witness called 911, pt fell while with EMS, pt is intoxicated with alcohol and admits to that, pt is homeless.    138/78 72

## 2021-04-28 ENCOUNTER — Emergency Department
Admission: EM | Admit: 2021-04-28 | Discharge: 2021-04-28 | Disposition: A | Discharge status: Home / Self Care | Payer: Self-pay | Attending: Emergency Medicine | Admitting: Emergency Medicine

## 2021-04-28 ENCOUNTER — Emergency Department: Payer: Self-pay

## 2021-04-28 ENCOUNTER — Other Ambulatory Visit: Payer: Self-pay

## 2021-04-28 DIAGNOSIS — F10120 Alcohol abuse with intoxication, uncomplicated: Secondary | ICD-10-CM | POA: Insufficient documentation

## 2021-04-28 DIAGNOSIS — T68XXXA Hypothermia, initial encounter: Secondary | ICD-10-CM | POA: Insufficient documentation

## 2021-04-28 DIAGNOSIS — F10929 Alcohol use, unspecified with intoxication, unspecified: Secondary | ICD-10-CM

## 2021-04-28 DIAGNOSIS — X31XXXA Exposure to excessive natural cold, initial encounter: Secondary | ICD-10-CM | POA: Insufficient documentation

## 2021-04-28 DIAGNOSIS — R4182 Altered mental status, unspecified: Secondary | ICD-10-CM | POA: Insufficient documentation

## 2021-04-28 DIAGNOSIS — Y908 Blood alcohol level of 240 mg/100 ml or more: Secondary | ICD-10-CM | POA: Insufficient documentation

## 2021-04-28 DIAGNOSIS — Z20822 Contact with and (suspected) exposure to covid-19: Secondary | ICD-10-CM | POA: Insufficient documentation

## 2021-04-28 DIAGNOSIS — R791 Abnormal coagulation profile: Secondary | ICD-10-CM | POA: Insufficient documentation

## 2021-04-28 LAB — URINALYSIS, COMPLETE (UACMP) WITH MICROSCOPIC
Bacteria, UA: NONE SEEN
Bilirubin Urine: NEGATIVE
Glucose, UA: NEGATIVE mg/dL
Hgb urine dipstick: NEGATIVE
Ketones, ur: NEGATIVE mg/dL
Leukocytes,Ua: NEGATIVE
Nitrite: NEGATIVE
Protein, ur: NEGATIVE mg/dL
Specific Gravity, Urine: 1.02 (ref 1.005–1.030)
Squamous Epithelial / HPF: NONE SEEN (ref 0–5)
pH: 6.5 (ref 5.0–8.0)

## 2021-04-28 LAB — URINE DRUG SCREEN, QUALITATIVE (ARMC ONLY)
Amphetamines, Ur Screen: NOT DETECTED
Barbiturates, Ur Screen: NOT DETECTED
Benzodiazepine, Ur Scrn: NOT DETECTED
Cannabinoid 50 Ng, Ur ~~LOC~~: NOT DETECTED
Cocaine Metabolite,Ur ~~LOC~~: NOT DETECTED
MDMA (Ecstasy)Ur Screen: NOT DETECTED
Methadone Scn, Ur: NOT DETECTED
Opiate, Ur Screen: NOT DETECTED
Phencyclidine (PCP) Ur S: NOT DETECTED
Tricyclic, Ur Screen: NOT DETECTED

## 2021-04-28 LAB — LACTIC ACID, PLASMA
Lactic Acid, Venous: 4 mmol/L (ref 0.5–1.9)
Lactic Acid, Venous: 2.6 mmol/L (ref 0.5–1.9)

## 2021-04-28 LAB — ETHANOL: Alcohol, Ethyl (B): 436 mg/dL (ref <10)

## 2021-04-28 LAB — CBC
HCT: 34.8 % — ABNORMAL LOW (ref 39.0–52.0)
Hemoglobin: 10.9 g/dL — ABNORMAL LOW (ref 13.0–17.0)
MCH: 28.9 pg (ref 26.0–34.0)
MCHC: 31.3 g/dL (ref 30.0–36.0)
MCV: 92.3 fL (ref 80.0–100.0)
Platelets: 93 10*3/uL — ABNORMAL LOW (ref 150–400)
RBC: 3.77 MIL/uL — ABNORMAL LOW (ref 4.22–5.81)
RDW: 18.4 % — ABNORMAL HIGH (ref 11.5–15.5)
WBC: 3 10*3/uL — ABNORMAL LOW (ref 4.0–10.5)
nRBC: 0 % (ref 0.0–0.2)

## 2021-04-28 LAB — PROTIME-INR
INR: 1.7 — ABNORMAL HIGH (ref 0.8–1.2)
Prothrombin Time: 19.7 seconds — ABNORMAL HIGH (ref 11.4–15.2)

## 2021-04-28 LAB — COMPREHENSIVE METABOLIC PANEL
ALT: 35 U/L (ref 0–44)
AST: 89 U/L — ABNORMAL HIGH (ref 15–41)
Albumin: 3.4 g/dL — ABNORMAL LOW (ref 3.5–5.0)
Alkaline Phosphatase: 191 U/L — ABNORMAL HIGH (ref 38–126)
Anion gap: 11 (ref 5–15)
BUN: <5 mg/dL — ABNORMAL LOW (ref 6–20)
CO2: 23 mmol/L (ref 22–32)
Calcium: 8.4 mg/dL — ABNORMAL LOW (ref 8.9–10.3)
Chloride: 108 mmol/L (ref 98–111)
Creatinine, Ser: 0.33 mg/dL — ABNORMAL LOW (ref 0.61–1.24)
GFR, Estimated: >60 mL/min (ref >60)
Glucose, Bld: 125 mg/dL — ABNORMAL HIGH (ref 70–99)
Potassium: 3.2 mmol/L — ABNORMAL LOW (ref 3.5–5.1)
Sodium: 142 mmol/L (ref 135–145)
Total Bilirubin: 2.5 mg/dL — ABNORMAL HIGH (ref 0.3–1.2)
Total Protein: 7.6 g/dL (ref 6.5–8.1)

## 2021-04-28 LAB — RESP PANEL BY RT-PCR (FLU A&B, COVID) ARPGX2
Influenza A by PCR: NEGATIVE
Influenza B by PCR: NEGATIVE
SARS Coronavirus 2 by RT PCR: NEGATIVE

## 2021-04-28 LAB — APTT: aPTT: 49 seconds — ABNORMAL HIGH (ref 24–36)

## 2021-04-28 LAB — TSH: TSH: 0.321 u[IU]/mL — ABNORMAL LOW (ref 0.350–4.500)

## 2021-04-28 LAB — AMMONIA: Ammonia: 28 umol/L (ref 9–35)

## 2021-04-28 MED ORDER — SODIUM CHLORIDE 0.9 % IV BOLUS
2000.0000 mL | Freq: Once | INTRAVENOUS | Status: AC
  Administered 2021-04-28: 2000 mL via INTRAVENOUS

## 2021-04-28 NOTE — Discharge Instructions (Signed)
Stop drinking so much 

## 2021-04-28 NOTE — ED Notes (Signed)
RN and tech moved patient to room 20. Undressed patient as he is saturated in cold urine and cold to the touch. Rectal temp obtained at 89.6. Placed in clean dry clothes. Bear hugger in place. Warm blankets. Placed on cardiac monitor at this time.

## 2021-04-28 NOTE — ED Triage Notes (Signed)
Pt BIB ems from abandoned home in [place]. Someone called 911. Pt is cool to the touch. EMS picked up and brought to hospital.  20 G LH

## 2021-04-28 NOTE — ED Notes (Signed)
Pt in bed sleeping. NAD noted, respirations even and unlabored

## 2021-04-28 NOTE — ED Notes (Signed)
Pt more alert and asking for OJ. Pt given some OJ.
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
## 2021-04-30 LAB — URINE CULTURE: Culture: >=100000 — AB

## 2021-05-03 LAB — CULTURE, BLOOD (ROUTINE X 2)
Culture: NO GROWTH
Culture: NO GROWTH

## 2021-05-19 ENCOUNTER — Other Ambulatory Visit: Payer: Self-pay

## 2021-05-19 ENCOUNTER — Encounter: Payer: Self-pay | Admitting: Family Medicine

## 2021-05-19 ENCOUNTER — Emergency Department: Payer: Self-pay

## 2021-05-19 ENCOUNTER — Inpatient Hospital Stay
Admission: EM | Admit: 2021-05-19 | Discharge: 2021-05-20 | DRG: 101 | Disposition: A | Discharge status: Home / Self Care | Payer: Self-pay | Attending: Hospitalist | Admitting: Hospitalist

## 2021-05-19 DIAGNOSIS — F10920 Alcohol use, unspecified with intoxication, uncomplicated: Secondary | ICD-10-CM

## 2021-05-19 DIAGNOSIS — Z8249 Family history of ischemic heart disease and other diseases of the circulatory system: Secondary | ICD-10-CM

## 2021-05-19 DIAGNOSIS — K7030 Alcoholic cirrhosis of liver without ascites: Secondary | ICD-10-CM | POA: Diagnosis present

## 2021-05-19 DIAGNOSIS — D696 Thrombocytopenia, unspecified: Secondary | ICD-10-CM

## 2021-05-19 DIAGNOSIS — K2970 Gastritis, unspecified, without bleeding: Secondary | ICD-10-CM | POA: Diagnosis present

## 2021-05-19 DIAGNOSIS — K219 Gastro-esophageal reflux disease without esophagitis: Secondary | ICD-10-CM | POA: Diagnosis present

## 2021-05-19 DIAGNOSIS — Z833 Family history of diabetes mellitus: Secondary | ICD-10-CM

## 2021-05-19 DIAGNOSIS — F10220 Alcohol dependence with intoxication, uncomplicated: Secondary | ICD-10-CM | POA: Diagnosis present

## 2021-05-19 DIAGNOSIS — K7682 Hepatic encephalopathy: Secondary | ICD-10-CM

## 2021-05-19 DIAGNOSIS — R404 Transient alteration of awareness: Secondary | ICD-10-CM

## 2021-05-19 DIAGNOSIS — R011 Cardiac murmur, unspecified: Secondary | ICD-10-CM | POA: Diagnosis present

## 2021-05-19 DIAGNOSIS — Z79899 Other long term (current) drug therapy: Secondary | ICD-10-CM

## 2021-05-19 DIAGNOSIS — R4182 Altered mental status, unspecified: Secondary | ICD-10-CM | POA: Diagnosis present

## 2021-05-19 DIAGNOSIS — G40909 Epilepsy, unspecified, not intractable, without status epilepticus: Principal | ICD-10-CM | POA: Diagnosis present

## 2021-05-19 DIAGNOSIS — K7460 Unspecified cirrhosis of liver: Secondary | ICD-10-CM

## 2021-05-19 DIAGNOSIS — Z20822 Contact with and (suspected) exposure to covid-19: Secondary | ICD-10-CM | POA: Diagnosis present

## 2021-05-19 DIAGNOSIS — Y908 Blood alcohol level of 240 mg/100 ml or more: Secondary | ICD-10-CM | POA: Diagnosis present

## 2021-05-19 DIAGNOSIS — R7401 Elevation of levels of liver transaminase levels: Secondary | ICD-10-CM

## 2021-05-19 DIAGNOSIS — Z9114 Patient's other noncompliance with medication regimen: Secondary | ICD-10-CM

## 2021-05-19 DIAGNOSIS — D61818 Other pancytopenia: Secondary | ICD-10-CM | POA: Diagnosis present

## 2021-05-19 DIAGNOSIS — R569 Unspecified convulsions: Secondary | ICD-10-CM

## 2021-05-19 LAB — CBC WITH DIFFERENTIAL/PLATELET
Abs Immature Granulocytes: 0 10*3/uL (ref 0.00–0.07)
Basophils Absolute: 0.1 10*3/uL (ref 0.0–0.1)
Basophils Relative: 2 %
Eosinophils Absolute: 0.1 10*3/uL (ref 0.0–0.5)
Eosinophils Relative: 1 %
HCT: 31.5 % — ABNORMAL LOW (ref 39.0–52.0)
Hemoglobin: 10 g/dL — ABNORMAL LOW (ref 13.0–17.0)
Immature Granulocytes: 0 %
Lymphocytes Relative: 58 %
Lymphs Abs: 2.3 10*3/uL (ref 0.7–4.0)
MCH: 29.1 pg (ref 26.0–34.0)
MCHC: 31.7 g/dL (ref 30.0–36.0)
MCV: 91.6 fL (ref 80.0–100.0)
Monocytes Absolute: 0.4 10*3/uL (ref 0.1–1.0)
Monocytes Relative: 9 %
Neutro Abs: 1.2 10*3/uL — ABNORMAL LOW (ref 1.7–7.7)
Neutrophils Relative %: 30 %
Platelets: 55 10*3/uL — ABNORMAL LOW (ref 150–400)
RBC: 3.44 MIL/uL — ABNORMAL LOW (ref 4.22–5.81)
RDW: 17.7 % — ABNORMAL HIGH (ref 11.5–15.5)
WBC: 3.9 10*3/uL — ABNORMAL LOW (ref 4.0–10.5)
nRBC: 0 % (ref 0.0–0.2)

## 2021-05-19 LAB — RESP PANEL BY RT-PCR (FLU A&B, COVID) ARPGX2
Influenza A by PCR: NEGATIVE
Influenza B by PCR: NEGATIVE
SARS Coronavirus 2 by RT PCR: NEGATIVE

## 2021-05-19 LAB — COMPREHENSIVE METABOLIC PANEL
ALT: 36 U/L (ref 0–44)
AST: 114 U/L — ABNORMAL HIGH (ref 15–41)
Albumin: 3.1 g/dL — ABNORMAL LOW (ref 3.5–5.0)
Alkaline Phosphatase: 215 U/L — ABNORMAL HIGH (ref 38–126)
Anion gap: 5 (ref 5–15)
BUN: 5 mg/dL — ABNORMAL LOW (ref 6–20)
CO2: 27 mmol/L (ref 22–32)
Calcium: 8.1 mg/dL — ABNORMAL LOW (ref 8.9–10.3)
Chloride: 106 mmol/L (ref 98–111)
Creatinine, Ser: 0.4 mg/dL — ABNORMAL LOW (ref 0.61–1.24)
GFR, Estimated: >60 mL/min (ref >60)
Glucose, Bld: 99 mg/dL (ref 70–99)
Potassium: 3.5 mmol/L (ref 3.5–5.1)
Sodium: 138 mmol/L (ref 135–145)
Total Bilirubin: 3.1 mg/dL — ABNORMAL HIGH (ref 0.3–1.2)
Total Protein: 7.4 g/dL (ref 6.5–8.1)

## 2021-05-19 LAB — PROTIME-INR
INR: 1.5 — ABNORMAL HIGH (ref 0.8–1.2)
Prothrombin Time: 18.5 seconds — ABNORMAL HIGH (ref 11.4–15.2)

## 2021-05-19 LAB — AMMONIA: Ammonia: 105 umol/L — ABNORMAL HIGH (ref 9–35)

## 2021-05-19 LAB — LIPASE, BLOOD: Lipase: 36 U/L (ref 11–51)

## 2021-05-19 MED ORDER — TRAMADOL HCL 50 MG PO TABS: 50.0000 mg | ORAL_TABLET | Freq: Three times a day (TID) | ORAL | Status: DC | PRN

## 2021-05-19 MED ORDER — ADULT MULTIVITAMIN W/MINERALS CH
1.0000 | ORAL_TABLET | Freq: Every day | ORAL | Status: DC
  Administered 2021-05-19 – 2021-05-20 (×2): 1 via ORAL
  Filled 2021-05-19 (×2): qty 1

## 2021-05-19 MED ORDER — SODIUM CHLORIDE 0.9 % IV BOLUS
1000.0000 mL | Freq: Once | INTRAVENOUS | Status: AC
Start: 2021-05-19 — End: 2021-05-19
  Administered 2021-05-19: 1000 mL via INTRAVENOUS

## 2021-05-19 MED ORDER — LORAZEPAM 2 MG/ML IJ SOLN
INTRAMUSCULAR | Status: AC
  Administered 2021-05-19: 1 mg via INTRAVENOUS
  Filled 2021-05-19: qty 1

## 2021-05-19 MED ORDER — LORAZEPAM 2 MG PO TABS: 0.0000 mg | ORAL_TABLET | Freq: Two times a day (BID) | ORAL | Status: DC

## 2021-05-19 MED ORDER — THIAMINE HCL 100 MG PO TABS
100.0000 mg | ORAL_TABLET | Freq: Every day | ORAL | Status: DC
  Administered 2021-05-19 – 2021-05-20 (×2): 100 mg via ORAL
  Filled 2021-05-19 (×2): qty 1

## 2021-05-19 MED ORDER — FOLIC ACID 1 MG PO TABS
1.0000 mg | ORAL_TABLET | Freq: Every day | ORAL | Status: DC
  Administered 2021-05-19 – 2021-05-20 (×2): 1 mg via ORAL
  Filled 2021-05-19 (×2): qty 1

## 2021-05-19 MED ORDER — LORAZEPAM 2 MG PO TABS
0.0000 mg | ORAL_TABLET | Freq: Four times a day (QID) | ORAL | Status: DC
  Administered 2021-05-20: 01:00:00 2 mg via ORAL
  Filled 2021-05-19: qty 1

## 2021-05-19 MED ORDER — TRAZODONE HCL 50 MG PO TABS: 50.0000 mg | ORAL_TABLET | Freq: Every evening | ORAL | Status: DC | PRN

## 2021-05-19 MED ORDER — LACTULOSE 10 GM/15ML PO SOLN
20.0000 g | Freq: Once | ORAL | Status: AC
  Administered 2021-05-19: 20 g via ORAL
  Filled 2021-05-19: qty 30

## 2021-05-19 MED ORDER — LORAZEPAM 2 MG/ML IJ SOLN: 1.0000 mg | INTRAMUSCULAR | Status: DC | PRN

## 2021-05-19 MED ORDER — LORAZEPAM 1 MG PO TABS: 1.0000 mg | ORAL_TABLET | ORAL | Status: DC | PRN

## 2021-05-19 MED ORDER — ONDANSETRON HCL 4 MG PO TABS: 4.0000 mg | ORAL_TABLET | Freq: Four times a day (QID) | ORAL | Status: DC | PRN

## 2021-05-19 MED ORDER — POLYETHYLENE GLYCOL 3350 17 G PO PACK: 17.0000 g | PACK | Freq: Every day | ORAL | Status: DC | PRN

## 2021-05-19 MED ORDER — PANTOPRAZOLE SODIUM 40 MG PO TBEC
40.0000 mg | DELAYED_RELEASE_TABLET | Freq: Two times a day (BID) | ORAL | Status: DC
  Administered 2021-05-19 – 2021-05-20 (×2): 40 mg via ORAL
  Filled 2021-05-19 (×2): qty 1

## 2021-05-19 MED ORDER — THIAMINE HCL 100 MG/ML IJ SOLN: 100.0000 mg | Freq: Every day | INTRAMUSCULAR | Status: DC

## 2021-05-19 MED ORDER — ONDANSETRON HCL 4 MG/2ML IJ SOLN: 4.0000 mg | Freq: Four times a day (QID) | INTRAMUSCULAR | Status: DC | PRN

## 2021-05-19 MED ORDER — LEVETIRACETAM IN NACL 1000 MG/100ML IV SOLN
1000.0000 mg | Freq: Once | INTRAVENOUS | Status: AC
  Administered 2021-05-19: 1000 mg via INTRAVENOUS
  Filled 2021-05-19: qty 100

## 2021-05-19 MED ORDER — SODIUM CHLORIDE 0.9% FLUSH
3.0000 mL | Freq: Two times a day (BID) | INTRAVENOUS | Status: DC
  Administered 2021-05-19: 3 mL via INTRAVENOUS

## 2021-05-19 MED ORDER — LEVETIRACETAM 500 MG PO TABS
1000.0000 mg | ORAL_TABLET | Freq: Two times a day (BID) | ORAL | Status: DC
  Administered 2021-05-19 – 2021-05-20 (×2): 1000 mg via ORAL
  Filled 2021-05-19 (×2): qty 2

## 2021-05-19 NOTE — ED Notes (Signed)
Pt saturated in urine. Pt wet clothing removed and placed in bag. Pt placed in blue paper scrubs and gown. Pt bed linens changed.

## 2021-05-19 NOTE — ED Notes (Signed)
Informed RN bed assigned 
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
## 2021-05-19 NOTE — H&P (Signed)
[facility] History and Physical  Brett Washington ZOX:[id number] DOB: 03/26/1981 DOA: 05/19/2021  Referring physician: Dr. Fuller Washington PCP: [facility]   Chief Complaint: found down  HPI: Brett FinerJose Washington is a 41 y.o. male with hx of severe alcohol use disorder, cirrhosis, anxiety disorder, seizure disorder likely related to alcohol use, who was brought to [facility] after being found down.  Per ED provider signout he was found down by the side of the road.  He apparently [facility] ED on a frequent basis and is well-known to the staff.  On my interview and exam patient was actually alert and oriented and able to engage in a conversation.  He reported that he has not taken his seizure medications for about 2 weeks.  He believes that he had a seizure prior to being found by EMS.  Reports that he continues to drink 2 to 340 ounce bottles of beer a day.  He also reports he has been significantly stressed today because his father had a heart attack earlier today and is admitted to the hospital at [facility].  Patient reports he has tried many times to stop drinking but has not been successful.  He reports he has been on medications for this in the past but he does not recall which ones.  In the ED initial vital signs largely unremarkable.  Lab work-up showed CMP notable for AST of 114, ALT 36, T bili 3.1, all largely in line with prior checks.  CBC showed pancytopenia, with platelets slightly worse than normal at 55 compared to 93 3 weeks ago. INR was 1.5, ammonia level was 105.  Alcohol level was 476.  On exam he was noted to have bloody gums, given unclear history when he arrived he was sent for CT head, maxillofacial, C-spine, all without acute findings.  Given altered mental status on arrival and concern for possible hepatic etiology, he was given a dose of lactulose.  He was also given a dose of IV Keppra and 1 L normal saline bolus.  He was admitted for further  management.  Review of Systems:  Pertinent positives and negative per HPI, all others reviewed and negative  Past Medical History:  Diagnosis Date   Alcohol abuse    Heart murmur    Seizures (HCC)    Past Surgical History:  Procedure Laterality Date   ESOPHAGOGASTRODUODENOSCOPY (EGD) WITH PROPOFOL N/A 11/02/2019   Procedure: ESOPHAGOGASTRODUODENOSCOPY (EGD) WITH PROPOFOL;  Surgeon: Toney ReilVanga, Rohini Reddy, MD;  Location: [facility] ENDOSCOPY;  Service: Gastroenterology;  Laterality: N/A;   HAND SURGERY     Social History:  reports that he has never smoked. He has never used smokeless tobacco. He reports current alcohol use of about 56.0 standard drinks per week. He reports that he does not use drugs.  No Known Allergies  Family History  Problem Relation Age of Onset   Diabetes Mellitus II Mother      Prior to Admission medications   Medication Sig Start Date End Date Taking? Authorizing Provider  gabapentin (NEURONTIN) 600 MG tablet Take 0.5 tablets (300 mg total) by mouth at bedtime. 11/09/19 12/09/19  Dorcas CarrowGhimire, Kuber, MD  lactulose (CHRONULAC) 10 GM/15ML solution Take 30 mLs (20 g total) by mouth 2 (two) times daily as needed for mild constipation (to make 2-3 loose BM a day). Patient not taking: Reported on 04/28/2021 11/09/19   Dorcas CarrowGhimire, Kuber, MD  levETIRAcetam (KEPPRA) 1000 MG tablet Take 1 tablet (1,000 mg total) by mouth 2 (two) times daily. 11/09/19  12/09/19  Dorcas Carrow, MD  pantoprazole (PROTONIX) 40 MG tablet Take 1 tablet (40 mg total) by mouth 2 (two) times daily. 11/09/19 12/09/19  Dorcas Carrow, MD  potassium chloride SA (KLOR-CON) 20 MEQ tablet Take 1 tablet (20 mEq total) by mouth daily for 7 days. 11/09/19 11/16/19  Dorcas Carrow, MD  rifaximin (XIFAXAN) 550 MG TABS tablet Take 1 tablet (550 mg total) by mouth 2 (two) times daily. Patient not taking: Reported on 04/28/2021 11/09/19   Dorcas Carrow, MD   Physical Exam: Vitals:   05/19/21 1434 05/19/21 1510 05/19/21 1530 05/19/21  1600  BP:   110/77 112/79  Pulse: 79 85 87 93  Resp: 11 13 (!) 24 (!) 25  Temp:      TempSrc:      SpO2: 95% 100% 92% 100%  Weight:      Height:        Wt Readings from Last 3 Encounters:  05/19/21 68 kg  04/28/21 68 kg  03/13/21 79 kg     General:  Appears intoxicated, jittery Eyes: no scleral icterus appreciated ENT: bloody gums Neck: no masses Cardiovascular: RRR, harsh systolic murmur (known bicuspid aortic valve). No LE edema. Telemetry: SR, no arrhythmias  Respiratory: CTA bilaterally, no w/r/r. Normal respiratory effort. Abdomen: soft, ntnd Skin: no rash or induration seen on limited exam Musculoskeletal: grossly normal tone BUE/BLE Psychiatric: grossly normal mood and affect, speech fluent and appropriate Neurologic: grossly non-focal.          Labs on Admission:  Basic Metabolic Panel: Recent Labs  Lab 05/19/21 1316  NA 138  K 3.5  CL 106  CO2 27  GLUCOSE 99  BUN 5*  CREATININE 0.40*  CALCIUM 8.1*   Liver Function Tests: Recent Labs  Lab 05/19/21 1316  AST 114*  ALT 36  ALKPHOS 215*  BILITOT 3.1*  PROT 7.4  ALBUMIN 3.1*   Recent Labs  Lab 05/19/21 1316  LIPASE 36   Recent Labs  Lab 05/19/21 1316  AMMONIA 105*   CBC: Recent Labs  Lab 05/19/21 1316  WBC 3.9*  NEUTROABS 1.2*  HGB 10.0*  HCT 31.5*  MCV 91.6  PLT 55*   Cardiac Enzymes: No results for input(s): CKTOTAL, CKMB, CKMBINDEX, TROPONINI in the last 168 hours.  BNP (last 3 results) No results for input(s): BNP in the last 8760 hours.  ProBNP (last 3 results) No results for input(s): PROBNP in the last 8760 hours.  CBG: No results for input(s): GLUCAP in the last 168 hours.  Radiological Exams on Admission: CT HEAD WO CONTRAST ( )  Result Date: 05/19/2021 CLINICAL DATA:  Head trauma, altered mental status, ETOH, found on road side with bloody mouth, unknown down time EXAM: CT HEAD WITHOUT CONTRAST CT MAXILLOFACIAL WITHOUT CONTRAST CT CERVICAL SPINE WITHOUT  CONTRAST TECHNIQUE: Multidetector CT imaging of the head, cervical spine, and maxillofacial structures were performed using the standard protocol without intravenous contrast. Multiplanar CT image reconstructions of the cervical spine and maxillofacial structures were also generated. RADIATION DOSE REDUCTION: This exam was performed according to the departmental dose-optimization program which includes automated exposure control, adjustment of the mA and/or kV according to patient size and/or use of iterative reconstruction technique. COMPARISON:  04/28/2021 FINDINGS: CT HEAD FINDINGS Brain: No evidence of acute infarction, hemorrhage, hydrocephalus, extra-axial collection or mass lesion/mass effect. Vascular: No hyperdense vessel or unexpected calcification. CT FACIAL BONES FINDINGS Skull: Normal. Negative for fracture or focal lesion. Facial bones: No displaced fractures or dislocations. Sinuses/Orbits: No acute finding. Other:  None. CT CERVICAL SPINE FINDINGS Alignment: Normal. Skull base and vertebrae: No acute fracture. No primary bone lesion or focal pathologic process. Soft tissues and spinal canal: No prevertebral fluid or swelling. No visible canal hematoma. Disc levels:  Intact. Upper chest: Negative. Other: None. IMPRESSION: 1. No acute intracranial pathology. 2. No displaced fractures or dislocations of the facial bones. 3. No fracture or static subluxation of the cervical spine. Electronically Signed   By: Jearld Lesch M.D.   On: 05/19/2021 14:16   CT Cervical Spine Wo Contrast  Result Date: 05/19/2021 CLINICAL DATA:  Head trauma, altered mental status, ETOH, found on road side with bloody mouth, unknown down time EXAM: CT HEAD WITHOUT CONTRAST CT MAXILLOFACIAL WITHOUT CONTRAST CT CERVICAL SPINE WITHOUT CONTRAST TECHNIQUE: Multidetector CT imaging of the head, cervical spine, and maxillofacial structures were performed using the standard protocol without intravenous contrast. Multiplanar CT image  reconstructions of the cervical spine and maxillofacial structures were also generated. RADIATION DOSE REDUCTION: This exam was performed according to the departmental dose-optimization program which includes automated exposure control, adjustment of the mA and/or kV according to patient size and/or use of iterative reconstruction technique. COMPARISON:  04/28/2021 FINDINGS: CT HEAD FINDINGS Brain: No evidence of acute infarction, hemorrhage, hydrocephalus, extra-axial collection or mass lesion/mass effect. Vascular: No hyperdense vessel or unexpected calcification. CT FACIAL BONES FINDINGS Skull: Normal. Negative for fracture or focal lesion. Facial bones: No displaced fractures or dislocations. Sinuses/Orbits: No acute finding. Other: None. CT CERVICAL SPINE FINDINGS Alignment: Normal. Skull base and vertebrae: No acute fracture. No primary bone lesion or focal pathologic process. Soft tissues and spinal canal: No prevertebral fluid or swelling. No visible canal hematoma. Disc levels:  Intact. Upper chest: Negative. Other: None. IMPRESSION: 1. No acute intracranial pathology. 2. No displaced fractures or dislocations of the facial bones. 3. No fracture or static subluxation of the cervical spine. Electronically Signed   By: Jearld Lesch M.D.   On: 05/19/2021 14:16   CT Maxillofacial Wo Contrast  Result Date: 05/19/2021 CLINICAL DATA:  Head trauma, altered mental status, ETOH, found on road side with bloody mouth, unknown down time EXAM: CT HEAD WITHOUT CONTRAST CT MAXILLOFACIAL WITHOUT CONTRAST CT CERVICAL SPINE WITHOUT CONTRAST TECHNIQUE: Multidetector CT imaging of the head, cervical spine, and maxillofacial structures were performed using the standard protocol without intravenous contrast. Multiplanar CT image reconstructions of the cervical spine and maxillofacial structures were also generated. RADIATION DOSE REDUCTION: This exam was performed according to the departmental dose-optimization program which  includes automated exposure control, adjustment of the mA and/or kV according to patient size and/or use of iterative reconstruction technique. COMPARISON:  04/28/2021 FINDINGS: CT HEAD FINDINGS Brain: No evidence of acute infarction, hemorrhage, hydrocephalus, extra-axial collection or mass lesion/mass effect. Vascular: No hyperdense vessel or unexpected calcification. CT FACIAL BONES FINDINGS Skull: Normal. Negative for fracture or focal lesion. Facial bones: No displaced fractures or dislocations. Sinuses/Orbits: No acute finding. Other: None. CT CERVICAL SPINE FINDINGS Alignment: Normal. Skull base and vertebrae: No acute fracture. No primary bone lesion or focal pathologic process. Soft tissues and spinal canal: No prevertebral fluid or swelling. No visible canal hematoma. Disc levels:  Intact. Upper chest: Negative. Other: None. IMPRESSION: 1. No acute intracranial pathology. 2. No displaced fractures or dislocations of the facial bones. 3. No fracture or static subluxation of the cervical spine. Electronically Signed   By: Jearld Lesch M.D.   On: 05/19/2021 14:16    EKG: Independently reviewed.  Sinus rhythm, interventricular conduction delay, no acute  ischemic changes.  Assessment/Plan Principal Problem:   AMS (altered mental status) Active Problems:   Thrombocytopenia (HCC)   Transaminitis   Seizure (HCC)   Brett FinerJose Washington is a 41 y.o. male with hx of severe alcohol use disorder, cirrhosis, anxiety disorder, seizure disorder likely related to alcohol use, who [facility] after being found down, likely secondary to seizure versus alcohol intoxication.  #Seizure disorder Status post IV Keppra loading dose.  Not totally clear to me if he has primary seizure disorder or if it is related to his alcohol use disorder. - Continue home Keppra 1 g twice daily  #Alcohol use disorder, severe #Cirrhosis #Pancytopenia #Transaminitis #Hyperbilirubinemia Patient intoxicated during our  interaction and not totally clear to me if he is interested in pursuing options for sobriety.  CBC and CMP appear to be about at his baseline with exception of thrombocytopenia which is somewhat worse than usual.  Admission to [facility] from last year reviewed, patient required transfer to ICU and briefly needed Precedex.  We will attempt to aggressively avoid significant withdrawal. - Both scheduled and as needed Ativan - Consider switching to Valium in spite of mildly increased LFTs given longer half-life - Discuss options such as p.o. or Depo naloxone, acamprosate, when patient is no longer intoxicated - No obvious signs of hepatic encephalopathy, did not restart rifaximin or lactulose at this time but low threshold to do so - Multivitamin, thiamine, folic acid  #Chronic medical problems GERD/gastritis-continue PPI twice daily  Code Status: Full code, presumed DVT Prophylaxis: SCDs Family Communication: None Disposition Plan: Inpatient, MedSurg  Time spent: 3450 min  Venora MaplesMatthew M Shuntel Fishburn MD/MP[facility]  Note:  This document was prepared using Conservation officer, historic buildingsDragon voice recognition software and may include unintentional dictation errors.

## 2021-05-19 NOTE — ED Triage Notes (Signed)
BIB ems. Pt was found on the size of the road by PD. Pt has bloody mouth and unknown how long he had been outside. Pt appears to be intoxicated per EMS. No vitals given to staff by EMS.

## 2021-05-19 NOTE — ED Notes (Signed)
RN called patient sister at his request to update them on his location. Sister advised she was already here with her dad who had a heart attack today.

## 2021-05-20 DIAGNOSIS — F10920 Alcohol use, unspecified with intoxication, uncomplicated: Secondary | ICD-10-CM

## 2021-05-20 LAB — COMPREHENSIVE METABOLIC PANEL
ALT: 31 U/L (ref 0–44)
AST: 100 U/L — ABNORMAL HIGH (ref 15–41)
Albumin: 2.5 g/dL — ABNORMAL LOW (ref 3.5–5.0)
Alkaline Phosphatase: 145 U/L — ABNORMAL HIGH (ref 38–126)
Anion gap: 5 (ref 5–15)
BUN: 7 mg/dL (ref 6–20)
CO2: 27 mmol/L (ref 22–32)
Calcium: 8.1 mg/dL — ABNORMAL LOW (ref 8.9–10.3)
Chloride: 105 mmol/L (ref 98–111)
Creatinine, Ser: 0.41 mg/dL — ABNORMAL LOW (ref 0.61–1.24)
GFR, Estimated: >60 mL/min (ref >60)
Glucose, Bld: 89 mg/dL (ref 70–99)
Potassium: 3.3 mmol/L — ABNORMAL LOW (ref 3.5–5.1)
Sodium: 137 mmol/L (ref 135–145)
Total Bilirubin: 2.7 mg/dL — ABNORMAL HIGH (ref 0.3–1.2)
Total Protein: 5.7 g/dL — ABNORMAL LOW (ref 6.5–8.1)

## 2021-05-20 LAB — CBC
HCT: 27 % — ABNORMAL LOW (ref 39.0–52.0)
Hemoglobin: 8.7 g/dL — ABNORMAL LOW (ref 13.0–17.0)
MCH: 28.8 pg (ref 26.0–34.0)
MCHC: 32.2 g/dL (ref 30.0–36.0)
MCV: 89.4 fL (ref 80.0–100.0)
Platelets: 47 10*3/uL — ABNORMAL LOW (ref 150–400)
RBC: 3.02 MIL/uL — ABNORMAL LOW (ref 4.22–5.81)
RDW: 17.6 % — ABNORMAL HIGH (ref 11.5–15.5)
WBC: 3.3 10*3/uL — ABNORMAL LOW (ref 4.0–10.5)
nRBC: 0 % (ref 0.0–0.2)

## 2021-05-20 LAB — PHOSPHORUS: Phosphorus: 3.6 mg/dL (ref 2.5–4.6)

## 2021-05-20 LAB — ETHANOL: Alcohol, Ethyl (B): 476 mg/dL (ref <10)

## 2021-05-20 MED ORDER — ADULT MULTIVITAMIN W/MINERALS CH: 1.0000 | ORAL_TABLET | Freq: Every day | ORAL | Status: AC

## 2021-05-20 MED ORDER — FOLIC ACID 1 MG PO TABS: 1.0000 mg | ORAL_TABLET | Freq: Every day | ORAL | Status: DC

## 2021-05-20 MED ORDER — THIAMINE HCL 100 MG PO TABS: 100.0000 mg | ORAL_TABLET | Freq: Every day | ORAL | 0 refills | Status: AC

## 2021-05-20 MED ORDER — FOLIC ACID 1 MG PO TABS: 1.0000 mg | ORAL_TABLET | Freq: Every day | ORAL | 0 refills | Status: AC

## 2021-05-20 MED ORDER — PANTOPRAZOLE SODIUM 40 MG PO TBEC
40.0000 mg | DELAYED_RELEASE_TABLET | Freq: Two times a day (BID) | ORAL | 2 refills | Status: DC
Start: 2021-05-20 — End: 2021-05-20

## 2021-05-20 MED ORDER — PANTOPRAZOLE SODIUM 40 MG PO TBEC: 40.0000 mg | DELAYED_RELEASE_TABLET | Freq: Two times a day (BID) | ORAL | 2 refills | Status: DC

## 2021-05-20 MED ORDER — LEVETIRACETAM 1000 MG PO TABS: 1000.0000 mg | ORAL_TABLET | Freq: Two times a day (BID) | ORAL | 0 refills | Status: DC

## 2021-05-20 MED ORDER — POTASSIUM CHLORIDE CRYS ER 20 MEQ PO TBCR
40.0000 meq | EXTENDED_RELEASE_TABLET | Freq: Once | ORAL | Status: AC
  Administered 2021-05-20: 40 meq via ORAL
  Filled 2021-05-20: qty 2

## 2021-05-20 MED ORDER — THIAMINE HCL 100 MG PO TABS: 100.0000 mg | ORAL_TABLET | Freq: Every day | ORAL | Status: DC

## 2021-05-20 NOTE — Progress Notes (Signed)
Discharge instructions reviewed with patient, verbalized understanding. IV and telemetry removed. Patient stated his ride would pick him up at the [place] entrance. This nurse transported patient to [place] entrance but patient did not see his ride. Patient stated his ride was on the way and he would wait in the lobby. Patient remained in lobby with all paperwork.
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
## 2021-05-21 LAB — LEVETIRACETAM LEVEL: Levetiracetam Lvl: <2 ug/mL — ABNORMAL LOW (ref 10.0–40.0)

## 2021-06-17 ENCOUNTER — Emergency Department
Admission: EM | Admit: 2021-06-17 | Discharge: 2021-06-18 | Disposition: A | Discharge status: Home / Self Care | Payer: Self-pay | Attending: Emergency Medicine | Admitting: Emergency Medicine

## 2021-06-17 ENCOUNTER — Other Ambulatory Visit: Payer: Self-pay

## 2021-06-17 DIAGNOSIS — F1010 Alcohol abuse, uncomplicated: Secondary | ICD-10-CM

## 2021-06-17 DIAGNOSIS — R569 Unspecified convulsions: Secondary | ICD-10-CM

## 2021-06-17 DIAGNOSIS — W19XXXA Unspecified fall, initial encounter: Secondary | ICD-10-CM

## 2021-06-17 DIAGNOSIS — D696 Thrombocytopenia, unspecified: Secondary | ICD-10-CM | POA: Insufficient documentation

## 2021-06-17 DIAGNOSIS — Y908 Blood alcohol level of 240 mg/100 ml or more: Secondary | ICD-10-CM | POA: Insufficient documentation

## 2021-06-17 NOTE — ED Triage Notes (Addendum)
Assist of spanish interpreter who is having difficulty understanding pt. Pt with history of etoh consumption with withdrawal seizure. Pt states he believes he had a seizure tonight and fell. Pt states he also took pills tonight but is not sure of names, believes they were for blood pressure and sleeping medication. Pt appears altered is having difficulty holding conversation and keeping his eyes open.  ?

## 2021-06-18 ENCOUNTER — Emergency Department
Admission: EM | Admit: 2021-06-18 | Discharge: 2021-06-19 | Disposition: A | Discharge status: Home / Self Care | Payer: Self-pay | Attending: Emergency Medicine | Admitting: Emergency Medicine

## 2021-06-18 ENCOUNTER — Other Ambulatory Visit: Payer: Self-pay

## 2021-06-18 ENCOUNTER — Emergency Department: Payer: Self-pay

## 2021-06-18 ENCOUNTER — Encounter: Payer: Self-pay | Admitting: Emergency Medicine

## 2021-06-18 DIAGNOSIS — G40909 Epilepsy, unspecified, not intractable, without status epilepticus: Secondary | ICD-10-CM | POA: Insufficient documentation

## 2021-06-18 DIAGNOSIS — F1010 Alcohol abuse, uncomplicated: Secondary | ICD-10-CM

## 2021-06-18 DIAGNOSIS — D696 Thrombocytopenia, unspecified: Secondary | ICD-10-CM

## 2021-06-18 DIAGNOSIS — Y908 Blood alcohol level of 240 mg/100 ml or more: Secondary | ICD-10-CM | POA: Insufficient documentation

## 2021-06-18 DIAGNOSIS — D72819 Decreased white blood cell count, unspecified: Secondary | ICD-10-CM | POA: Insufficient documentation

## 2021-06-18 DIAGNOSIS — R770 Abnormality of albumin: Secondary | ICD-10-CM | POA: Insufficient documentation

## 2021-06-18 DIAGNOSIS — Z20822 Contact with and (suspected) exposure to covid-19: Secondary | ICD-10-CM | POA: Insufficient documentation

## 2021-06-18 DIAGNOSIS — R7401 Elevation of levels of liver transaminase levels: Secondary | ICD-10-CM | POA: Insufficient documentation

## 2021-06-18 DIAGNOSIS — E876 Hypokalemia: Secondary | ICD-10-CM

## 2021-06-18 LAB — CBC
HCT: 30.3 % — ABNORMAL LOW (ref 39.0–52.0)
HCT: 35 % — ABNORMAL LOW (ref 39.0–52.0)
Hemoglobin: 11.3 g/dL — ABNORMAL LOW (ref 13.0–17.0)
Hemoglobin: 9.5 g/dL — ABNORMAL LOW (ref 13.0–17.0)
MCH: 28.6 pg (ref 26.0–34.0)
MCH: 28.6 pg (ref 26.0–34.0)
MCHC: 31.4 g/dL (ref 30.0–36.0)
MCHC: 32.3 g/dL (ref 30.0–36.0)
MCV: 88.6 fL (ref 80.0–100.0)
MCV: 91.3 fL (ref 80.0–100.0)
Platelets: 65 10*3/uL — ABNORMAL LOW (ref 150–400)
Platelets: 92 10*3/uL — ABNORMAL LOW (ref 150–400)
RBC: 3.32 MIL/uL — ABNORMAL LOW (ref 4.22–5.81)
RBC: 3.95 MIL/uL — ABNORMAL LOW (ref 4.22–5.81)
RDW: 17.4 % — ABNORMAL HIGH (ref 11.5–15.5)
RDW: 17.7 % — ABNORMAL HIGH (ref 11.5–15.5)
WBC: 3.8 10*3/uL — ABNORMAL LOW (ref 4.0–10.5)
WBC: 5.1 10*3/uL (ref 4.0–10.5)
nRBC: 0 % (ref 0.0–0.2)
nRBC: 0 % (ref 0.0–0.2)

## 2021-06-18 LAB — URINALYSIS, ROUTINE W REFLEX MICROSCOPIC
Bilirubin Urine: NEGATIVE
Glucose, UA: NEGATIVE mg/dL
Hgb urine dipstick: NEGATIVE
Ketones, ur: NEGATIVE mg/dL
Leukocytes,Ua: NEGATIVE
Nitrite: NEGATIVE
Protein, ur: NEGATIVE mg/dL
Specific Gravity, Urine: 1.008 (ref 1.005–1.030)
pH: 8 (ref 5.0–8.0)

## 2021-06-18 LAB — COMPREHENSIVE METABOLIC PANEL
ALT: 51 U/L — ABNORMAL HIGH (ref 0–44)
AST: 105 U/L — ABNORMAL HIGH (ref 15–41)
Albumin: 3.2 g/dL — ABNORMAL LOW (ref 3.5–5.0)
Alkaline Phosphatase: 123 U/L (ref 38–126)
Anion gap: 12 (ref 5–15)
BUN: 5 mg/dL — ABNORMAL LOW (ref 6–20)
CO2: 26 mmol/L (ref 22–32)
Calcium: 8.3 mg/dL — ABNORMAL LOW (ref 8.9–10.3)
Chloride: 104 mmol/L (ref 98–111)
Creatinine, Ser: 0.47 mg/dL — ABNORMAL LOW (ref 0.61–1.24)
GFR, Estimated: >60 mL/min (ref >60)
Glucose, Bld: 104 mg/dL — ABNORMAL HIGH (ref 70–99)
Potassium: 3.3 mmol/L — ABNORMAL LOW (ref 3.5–5.1)
Sodium: 142 mmol/L (ref 135–145)
Total Bilirubin: 3.7 mg/dL — ABNORMAL HIGH (ref 0.3–1.2)
Total Protein: 7 g/dL (ref 6.5–8.1)

## 2021-06-18 LAB — MAGNESIUM: Magnesium: 1.6 mg/dL — ABNORMAL LOW (ref 1.7–2.4)

## 2021-06-18 LAB — URINE DRUG SCREEN, QUALITATIVE (ARMC ONLY)
Amphetamines, Ur Screen: NOT DETECTED
Barbiturates, Ur Screen: NOT DETECTED
Benzodiazepine, Ur Scrn: NOT DETECTED
Cannabinoid 50 Ng, Ur ~~LOC~~: NOT DETECTED
Cocaine Metabolite,Ur ~~LOC~~: NOT DETECTED
MDMA (Ecstasy)Ur Screen: NOT DETECTED
Methadone Scn, Ur: NOT DETECTED
Opiate, Ur Screen: NOT DETECTED
Phencyclidine (PCP) Ur S: NOT DETECTED
Tricyclic, Ur Screen: NOT DETECTED

## 2021-06-18 LAB — BASIC METABOLIC PANEL
Anion gap: 6 (ref 5–15)
BUN: 6 mg/dL (ref 6–20)
CO2: 25 mmol/L (ref 22–32)
Calcium: 8.3 mg/dL — ABNORMAL LOW (ref 8.9–10.3)
Chloride: 107 mmol/L (ref 98–111)
Creatinine, Ser: 0.39 mg/dL — ABNORMAL LOW (ref 0.61–1.24)
GFR, Estimated: >60 mL/min (ref >60)
Glucose, Bld: 103 mg/dL — ABNORMAL HIGH (ref 70–99)
Potassium: 3.1 mmol/L — ABNORMAL LOW (ref 3.5–5.1)
Sodium: 138 mmol/L (ref 135–145)

## 2021-06-18 LAB — ETHANOL
Alcohol, Ethyl (B): 382 mg/dL (ref <10)
Alcohol, Ethyl (B): 245 mg/dL — ABNORMAL HIGH (ref <10)

## 2021-06-18 LAB — TROPONIN I (HIGH SENSITIVITY): Troponin I (High Sensitivity): 15 ng/L (ref <18)

## 2021-06-18 MED ORDER — LEVETIRACETAM IN NACL 1000 MG/100ML IV SOLN
1000.0000 mg | Freq: Once | INTRAVENOUS | Status: AC
  Administered 2021-06-18: 1000 mg via INTRAVENOUS
  Filled 2021-06-18: qty 100

## 2021-06-18 MED ORDER — ADULT MULTIVITAMIN W/MINERALS CH
1.0000 | ORAL_TABLET | Freq: Every day | ORAL | Status: DC
  Administered 2021-06-18: 1 via ORAL
  Filled 2021-06-18: qty 1

## 2021-06-18 MED ORDER — LORAZEPAM 2 MG/ML IJ SOLN: 1.0000 mg | INTRAMUSCULAR | Status: DC | PRN

## 2021-06-18 MED ORDER — THIAMINE HCL 100 MG/ML IJ SOLN
100.0000 mg | Freq: Every day | INTRAMUSCULAR | Status: DC
  Filled 2021-06-18: qty 2

## 2021-06-18 MED ORDER — FOLIC ACID 1 MG PO TABS
1.0000 mg | ORAL_TABLET | Freq: Every day | ORAL | Status: DC
  Administered 2021-06-18: 1 mg via ORAL
  Filled 2021-06-18: qty 1

## 2021-06-18 MED ORDER — MAGNESIUM SULFATE 4 GM/100ML IV SOLN
4.0000 g | Freq: Once | INTRAVENOUS | Status: AC
  Administered 2021-06-18: 4 g via INTRAVENOUS
  Filled 2021-06-18: qty 100

## 2021-06-18 MED ORDER — POTASSIUM CHLORIDE CRYS ER 20 MEQ PO TBCR
40.0000 meq | EXTENDED_RELEASE_TABLET | Freq: Once | ORAL | Status: AC
  Administered 2021-06-18: 40 meq via ORAL
  Filled 2021-06-18: qty 2

## 2021-06-18 MED ORDER — LORAZEPAM 1 MG PO TABS
1.0000 mg | ORAL_TABLET | ORAL | Status: DC | PRN
  Administered 2021-06-18: 1 mg via ORAL
  Filled 2021-06-18: qty 1

## 2021-06-18 MED ORDER — PANTOPRAZOLE SODIUM 40 MG PO TBEC
40.0000 mg | DELAYED_RELEASE_TABLET | Freq: Two times a day (BID) | ORAL | Status: DC
Start: 2021-06-18 — End: 2021-06-19
  Administered 2021-06-18: 40 mg via ORAL
  Filled 2021-06-18: qty 1

## 2021-06-18 MED ORDER — CHLORDIAZEPOXIDE HCL 25 MG PO CAPS: 25.0000 mg | ORAL_CAPSULE | Freq: Once | ORAL | Status: DC

## 2021-06-18 MED ORDER — SODIUM CHLORIDE 0.9 % IV BOLUS
1000.0000 mL | Freq: Once | INTRAVENOUS | Status: AC
Start: 2021-06-18 — End: 2021-06-18
  Administered 2021-06-18: 1000 mL via INTRAVENOUS

## 2021-06-18 MED ORDER — THIAMINE HCL 100 MG PO TABS
100.0000 mg | ORAL_TABLET | Freq: Every day | ORAL | Status: DC
  Administered 2021-06-18: 100 mg via ORAL
  Filled 2021-06-18: qty 1

## 2021-06-18 MED ORDER — LORAZEPAM 2 MG/ML IJ SOLN
INTRAMUSCULAR | Status: AC
  Administered 2021-06-18: 1 mg via INTRAVENOUS
  Filled 2021-06-18: qty 1

## 2021-06-18 MED ORDER — LEVETIRACETAM 500 MG PO TABS: 1000.0000 mg | ORAL_TABLET | Freq: Two times a day (BID) | ORAL | Status: DC

## 2021-06-18 MED ORDER — LORAZEPAM 2 MG/ML IJ SOLN: 1.0000 mg | Freq: Once | INTRAMUSCULAR | Status: AC

## 2021-06-18 NOTE — ED Notes (Signed)
EDP at bedside with [facility] interpreter  ?

## 2021-06-18 NOTE — ED Notes (Signed)
Pt sleepy but responsive to voice, oriented. Responses yes when asked if he has hx of seizures and states that he has been taking meds. Pt takes keppra per PTA med list. ?

## 2021-06-18 NOTE — ED Notes (Addendum)
First Nurse Note:  Pt to ED via [facility] from the side of the road for possible seizure. Per EMS pt was not post ictal with them. VSS. ETOH on board.  ?
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
## 2021-06-18 NOTE — ED Notes (Signed)
Dr. Lenard Lance messaged via secure chat to notify of critical lab value: ETOH 382. ?
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
## 2021-06-18 NOTE — ED Provider Notes (Signed)
? ?[facility] ?Provider Note ? ? ? Event Date/Time  ? First MD Initiated Contact with Patient 06/18/21 2051   ?  (approximate) ? ? ?History  ? ?Seizures ? ? ?HPI ? ?Brett Washington is a 41 y.o. male with a past medical history of seizure disorder and alcohol abuse disorder recently evaluated last night following concerns for alcohol withdrawal related seizure discharged earlier this morning and presents for evaluation via EMS after a supportively found lying on the side of the road after possible seizure.  EMS did not witness the seizure and is unclear if bystanders saw a seizure or not.  Patient states he does not remember what happened.  He states he did get some alcohol after leaving the emergency room earlier this morning.  States he did not take any Keppra after leaving the ED.  He is denying any symptoms at this time including headache, neck pain, abdominal pain, chest pain, shortness of breath, cough or any other sick symptoms.  He is denying any SI or HI.  Denies any hallucinations.  Denies taking any illicit drugs.  Denies any other acute concerns at this time. ? ?  ? ? ?Physical Exam  ?Triage Vital Signs: ?ED Triage Vitals  ?Enc Vitals Group  ?   BP 06/18/21 1743 122/82  ?   Pulse Rate 06/18/21 1743 91  ?   Resp 06/18/21 1743 18  ?   Temp 06/18/21 1743 98.3 ?F (36.8 ?C)  ?   Temp Source 06/18/21 1743 Oral  ?   SpO2 06/18/21 1743 100 %  ?   Weight 06/18/21 1744 176 lb (79.8 kg)  ?   Height 06/18/21 1744 5\' 8"  (1.727 m)  ?   Head Circumference --   ?   Peak Flow --   ?   Pain Score 06/18/21 1744 6  ?   Pain Loc --   ?   Pain Edu? --   ?   Excl. in [place]? --   ? ? ?Most recent vital signs: ?Vitals:  ? 06/18/21 2140 06/18/21 2141  ?BP:  118/75  ?Pulse: 100 98  ?Resp:  16  ?Temp:    ?SpO2:  99%  ? ? ?General: Awake, chronically ill-appearing. ?CV:  Good peripheral perfusion.  2+ radial pulses.  No murmur. ?Resp:  Normal effort.  Clear bilaterally. ?Abd:  No distention.  Soft  throughout. ?Other:  Patient seems very slightly intoxicated with very mildly slurred speech.  No pronator drift or finger dysmetria.  Symmetric strength in all extremities.  Cranial nerves II through XII are grossly intact.  There is no tenderness over the C/T/L-spine.  No obvious trauma to the extremities or torso. ? ? ?ED Results / Procedures / Treatments  ?Labs ?(all labs ordered are listed, but only abnormal results are displayed) ?Labs Reviewed  ?BASIC METABOLIC PANEL - Abnormal; Notable for the following components:  ?    Result Value  ? Potassium 3.1 (*)   ? Glucose, Bld 103 (*)   ? Creatinine, Ser 0.39 (*)   ? Calcium 8.3 (*)   ? All other components within normal limits  ?CBC - Abnormal; Notable for the following components:  ? WBC 3.8 (*)   ? RBC 3.32 (*)   ? Hemoglobin 9.5 (*)   ? HCT 30.3 (*)   ? RDW 17.7 (*)   ? Platelets 65 (*)   ? All other components within normal limits  ?ETHANOL - Abnormal; Notable for the following components:  ?  Alcohol, Ethyl (B) 245 (*)   ? All other components within normal limits  ?URINALYSIS, ROUTINE W REFLEX MICROSCOPIC - Abnormal; Notable for the following components:  ? Color, Urine YELLOW (*)   ? APPearance HAZY (*)   ? All other components within normal limits  ?MAGNESIUM - Abnormal; Notable for the following components:  ? Magnesium 1.6 (*)   ? All other components within normal limits  ?RESP PANEL BY RT-PCR (FLU A&B, COVID) ARPGX2  ?URINE DRUG SCREEN, QUALITATIVE ([facility] ONLY)  ?LEVETIRACETAM LEVEL  ?CBG MONITORING, ED  ?TROPONIN I (HIGH SENSITIVITY)  ? ? ? ?EKG ? ?ECG shows sinus rhythm with a ventricular rate of 88 with some nonspecific changes versus artifact in inferior and lateral leads without other clear evidence of acute ischemia or significant arrhythmia. ? ? ?RADIOLOGY ? ? ?CT head and C-spine on my interpretation without evidence of skull fracture, intracranial hemorrhage or acute C-spine injury.  I also reviewed radiologist interpretation and agree with her  findings of same as well as notation of some stable but age advanced generalized atrophy. ? ?PROCEDURES: ? ?Critical Care performed: No ? ?Procedures ? ? ? ?MEDICATIONS ORDERED IN ED: ?Medications  ?LORazepam (ATIVAN) tablet 1-4 mg (1 mg Oral Given 06/18/21 2143)  ?  Or  ?LORazepam (ATIVAN) injection 1-4 mg ( Intravenous See Alternative 06/18/21 2143)  ?thiamine tablet 100 mg (100 mg Oral Given 06/18/21 2132)  ?  Or  ?thiamine (B-1) injection 100 mg ( Intravenous See Alternative 06/18/21 2132)  ?folic acid (FOLVITE) tablet 1 mg (1 mg Oral Given 06/18/21 2132)  ?multivitamin with minerals tablet 1 tablet (1 tablet Oral Given 06/18/21 2132)  ?levETIRAcetam (KEPPRA) tablet 1,000 mg (has no administration in time range)  ?pantoprazole (PROTONIX) EC tablet 40 mg (40 mg Oral Given 06/18/21 2228)  ?magnesium sulfate IVPB 4 g 100 mL (has no administration in time range)  ?potassium chloride SA (KLOR-CON M) CR tablet 40 mEq (40 mEq Oral Given 06/18/21 2132)  ?levETIRAcetam (KEPPRA) IVPB 1000 mg/100 mL premix (0 mg Intravenous Stopped 06/18/21 2153)  ? ? ? ?IMPRESSION / MDM / ASSESSMENT AND PLAN / ED COURSE  ?I reviewed the triage vital signs and the nursing notes. ?             ?               ? ?Differential diagnosis includes, but is not limited to syncope possibly from arrhythmia, dehydration, anemia, metabolic derangements versus possible seizure in the setting of ongoing alcohol abuse.  While patient is denying any symptoms on my assessment and has no obvious evidence of trauma given he appears slightly intoxicated and has a history of thrombocytopenia on review of prior labs will obtain a CT head and C-spine. ? ?CT head and C-spine on my interpretation without evidence of skull fracture, intracranial hemorrhage or acute C-spine injury.  I also reviewed radiologist interpretation and agree with her findings of same as well as notation of some stable but age advanced generalized atrophy. ? ?EKG and nonelevated troponin are not suggestive  of ACS or myocarditis.  ? ?I reviewed labs obtained last night including a CMP that showed a K of 3.3 and mild transaminitis and an albumin of 3.2.  BMP obtained on arrival today shows a K of 3.1 without any other significant electrolyte or metabolic derangements.  CBC shows WBC count of 3.8 and hemoglobin of 9.5 prior to 11.31-day ago.  Platelets are 65.  It seems patient's platelets normally range between  50 and 90s.  Serum ethanol on arrival is 245.  UA obtained in triage unremarkable.  Magnesium 1.6. ? ?Potassium and magnesium were repleted.  Patient given an IV dose of Keppra.  Placed on CIWA protocol.  He states he thinks he supposed to go to inpatient alcohol rehab tomorrow other is not aware this is.  Given he has had 2 possible seizures in the last 24 hours in the setting of fairly significant ongoing alcohol abuse and is requesting help I think it is reasonable for patient to be observed overnight until he can talk to TTS in the morning to help him get set up with alcohol detox.  Home medications reordered.  Placed in border status. ? ?  ? ? ?FINAL CLINICAL IMPRESSION(S) / ED DIAGNOSES  ? ?Final diagnoses:  ?Alcohol abuse  ?Hypokalemia  ?Thrombocytopenia ([place])  ?Hypomagnesemia  ? ? ? ?Rx / DC Orders  ? ?ED Discharge Orders   ? ? None  ? ?  ? ? ? ?Note:  This document was prepared using Dragon voice recognition software and may include unintentional dictation errors. ?  ?Lucrezia Starch, MD ?06/18/21 2243 ? ?

## 2021-06-18 NOTE — ED Notes (Signed)
Pt states he had a seizure. His seizures are from ETOH withdrawl ?

## 2021-06-18 NOTE — ED Provider Notes (Signed)
? ?[facility] ?Provider Note ? ? ? Event Date/Time  ? First MD Initiated Contact with Patient 06/18/21 0010   ?  (approximate) ? ?History  ? ?Chief Complaint: Seizures ?Spanish interpreter used for this evaluation ? ?HPI ? ?Brett Washington is a 41 y.o. male with a past medical history of alcohol abuse, seizures, presents to the emergency department after seizure and fall.  According to the patient as well as report patient fell approximately 30 minutes prior to arrival and had seizure-like activity.  Patient states a history of seizures but he is not sure if it is due to alcohol or not.  Patient does admit to heavy alcohol use, last drink was around 2 PM today.  Patient also states he is scheduled to start alcohol detox on Tuesday.  Patient is somewhat confused at times but is able to answer most questions appropriately.  Follows commands well. ? ?Physical Exam  ? ?Triage Vital Signs: ?ED Triage Vitals  ?Enc Vitals Group  ?   BP 06/17/21 2347 (!) 140/92  ?   Pulse Rate 06/17/21 2347 (!) 122  ?   Resp 06/17/21 2347 14  ?   Temp 06/18/21 0004 (!) 97 ?F (36.1 ?C)  ?   Temp Source 06/18/21 0004 Axillary  ?   SpO2 06/17/21 2347 100 %  ?   Weight 06/17/21 2347 175 lb (79.4 kg)  ?   Height 06/17/21 2347 5\' 8"  (1.727 m)  ?   Head Circumference --   ?   Peak Flow --   ?   Pain Score --   ?   Pain Loc --   ?   Pain Edu? --   ?   Excl. in GC? --   ? ? ?Most recent vital signs: ?Vitals:  ? 06/17/21 2347 06/18/21 0004  ?BP: (!) 140/92   ?Pulse: (!) 122   ?Resp: 14   ?Temp:  (!) 97 ?F (36.1 ?C)  ?SpO2: 100%   ? ? ?General: Awake, no distress.  ?CV:  Good peripheral perfusion.  Regular rate and rhythm  ?Resp:  Normal effort.  Equal breath sounds bilaterally.  ?Abd:  No distention.  Soft, nontender.  No rebound or guarding. ?Other:  Does have tearing from both of his eyes.  Patient states this happens every time he has a seizure. ? ? ?ED Results / Procedures / Treatments  ? ?EKG ? ?EKG viewed and interpreted by  myself shows a normal sinus rhythm 88 bpm with a slightly widened QRS, normal axis, largely normal intervals, and nonspecific ST changes without elevation. ? ?RADIOLOGY ? ?I personally viewed the CT images, no acute abnormality seen on my evaluation. ?Radiology is read the CT is negative. ? ?MEDICATIONS ORDERED IN ED: ?Medications  ?LORazepam (ATIVAN) 2 MG/ML injection (has no administration in time range)  ?LORazepam (ATIVAN) injection 1 mg (has no administration in time range)  ?chlordiazePOXIDE (LIBRIUM) capsule 25 mg (has no administration in time range)  ? ? ? ?IMPRESSION / MDM / ASSESSMENT AND PLAN / ED COURSE  ?I reviewed the triage vital signs and the nursing notes. ? ?Patient presents emergency department after a seizure.  Patient has a long history of alcohol use and visits for alcohol use in the past.  Highly suspect alcohol withdrawal seizure, last drink was around 2 PM today.  We will dose 1 mg of IV Ativan.  We will dose 25 mg of Librium orally.  We will check labs and obtain a CT scan of  the head given the reported seizure and fall.  We will IV hydrate and continue to closely monitor.  Patient does appear slightly postictal but is able to answer most questions appropriately but does have some confusion. ? ?Patient presents to the emergency department for patient CT scan is negative.  Labs are resulted showing significant alcohol intoxication.  With an ethanol level of 382.  CBC is largely normal besides mild thrombocytopenia largely unchanged from historical values.  Chemistry is nonrevealing.  Patient received Ativan on arrival did not receive Librium.  We will continue to monitor the patient he is currently resting comfortably.  Once awake and sober I believe patient could be discharged home as he has follow-up on Tuesday for detox.  Patient care signed out to oncoming provider. ? ?FINAL CLINICAL IMPRESSION(S) / ED DIAGNOSES  ? ?Alcohol use disorder ?Seizure-like activity ?Fall ? ?Note:  This  document was prepared using Dragon voice recognition software and may include unintentional dictation errors. ?  Minna Antis, MD ?06/18/21 [phone] ? ?

## 2021-06-19 LAB — RESP PANEL BY RT-PCR (FLU A&B, COVID) ARPGX2
Influenza A by PCR: NEGATIVE
Influenza B by PCR: NEGATIVE
SARS Coronavirus 2 by RT PCR: NEGATIVE

## 2021-06-19 NOTE — TOC Initial Note (Signed)
Transition of Care (TOC) - Initial/Assessment Note  ? ? ?Patient Details  ?Name: Brett Washington ?MRN: [id number] ?Date of Birth: 1980/10/18 ? ?Transition of Care (TOC) CM/SW Contact:    ?Shelbie Hutching, RN ?Phone Number: ?06/19/2021, 11:00 AM ? ?Clinical Narrative:                 ?Patient brought into the emergency room for alcohol withdrawal, seizure.   ?Patient discharged home.  His mother is picking him up today.  RNCM met with patient at the bedside and provided him with substance abuse resources in [place].  He reports he is interested in getting into treatment.   ?Patient does not drive he has DUI's on his record and meets with a Research officer, trade union.  He says he needs to meet with his parole officer before he goes into treatment.   ?Patient lives with his mother. ? ? ? ?Expected Discharge Plan: Home/Self Care ?Barriers to Discharge: Barriers Resolved ? ? ?Patient Goals and CMS Choice ?Patient states their goals for this hospitalization and ongoing recovery are:: patient interested in treatment but needs to meet with his parole officer first. ?  ?  ? ?Expected Discharge Plan and Services ?Expected Discharge Plan: Home/Self Care ?  ?Discharge Planning Services: CM Consult ?  ?Living arrangements for the past 2 months: [place] ?                ?DME Arranged: N/A ?DME Agency: NA ?  ?  ?  ?HH Arranged: NA ?[place] Agency: NA ?  ?  ?  ? ?Prior Living Arrangements/Services ?Living arrangements for the past 2 months: [place] ?Lives with:: Parents ?Patient language and need for interpreter reviewed:: Yes ?Do you feel safe going back to the place where you live?: Yes      ?Need for Family Participation in Patient Care: Yes (Comment) ?Care giver support system in place?: Yes (comment) (mother) ?  ?Criminal Activity/Legal Involvement Pertinent to Current Situation/Hospitalization: No - Comment as needed ? ?Activities of Daily Living ?  ?  ? ?Permission Sought/Granted ?  ?  ?   ?   ?   ?   ? ?Emotional  Assessment ?Appearance:: Appears stated age ?Attitude/Demeanor/Rapport: Engaged ?Affect (typically observed): Accepting ?Orientation: : Oriented to Self, Oriented to Place, Oriented to  Time, Oriented to Situation ?Alcohol / Substance Use: Alcohol Use ?Psych Involvement: No (comment) ? ?Admission diagnosis:  seizure ?Patient Active Problem List  ? Diagnosis Date Noted  ? AMS (altered mental status) 05/19/2021  ? Hepatic encephalopathy 09/27/2020  ? Bicuspid aortic valve 06/06/2020  ? Elevated bilirubin   ? Full code status   ? Advanced directives, counseling/discussion   ? Goals of care, counseling/discussion   ? Palliative care by specialist   ? Hematemesis 11/01/2019  ? Acute hepatic encephalopathy 11/01/2019  ? Leukocytosis 11/01/2019  ? Abnormal LFTs 11/01/2019  ? Seizure ([place]) 11/01/2019  ? Alcohol abuse 11/01/2019  ? Hepatic encephalopathy   ? Hypothermia 06/02/2019  ? Hypothermia due to cold environment 06/02/2019  ? Alcohol intoxication with delirium ([place]) 06/02/2019  ? COVID-19 virus infection 06/02/2019  ? Generalized anxiety disorder 04/05/2019  ? Alcoholic cirrhosis ([place]) 03/50/0938  ? LOC (loss of consciousness) ([place]) 01/11/2017  ? Alcohol use disorder, severe, dependence ([place]) 11/06/2016  ? Aspiration pneumonia due to vomit ([place])   ? Alcohol withdrawal seizure ([place]) 10/31/2016  ? Thrombocytopenia ([place]) 10/31/2016  ? Transaminitis 10/31/2016  ? Hypokalemia 10/31/2016  ? ?PCP:  [facility] ?Pharmacy:   ?[facility] [facility] [place] [facility] [place] ?[address] ([place]) [place] [address] ?Phone: [phone] Fax: [phone] ? ?[facility] ?[address] ?[place] [address] ?Phone: [phone] Fax: [phone] ? ?[place] [place] [address] ?[place] [address] ?Phone: [phone] Fax: [phone] ? ? ? ? ?Social  Determinants of Health (SDOH) Interventions ?  ? ?Readmission Risk Interventions ?No flowsheet data found. ? ? ?

## 2021-06-19 NOTE — ED Notes (Signed)
Per pt, mom will be here around 11:00 to pick pt up. Edp notified. ?

## 2021-06-19 NOTE — ED Provider Notes (Signed)
----------------------------------------- ?  7:13 AM on 06/19/2021 ?----------------------------------------- ? ? ?Blood pressure 105/77, pulse 76, temperature 98.3 ?F (36.8 ?C), temperature source Oral, resp. rate 18, height 5\' 8"  (1.727 m), weight 79.8 kg, SpO2 98 %. ? ?The patient is calm and cooperative at this time.  There have been no acute events since the last update.  Awaiting disposition plan from Social Work team. ?  ? , MD ?06/19/21 [phone] ? ?

## 2021-06-19 NOTE — ED Notes (Signed)
Pt given phone and assisted with calling family for possible ride. ?

## 2021-06-19 NOTE — ED Notes (Signed)
Pt awake, oob to BR without assistance. Pt is eating breakfast at this time. No issues. ?

## 2021-06-19 NOTE — ED Provider Notes (Signed)
Procedures ? ?  ? ?----------------------------------------- ?10:50 AM on 06/19/2021 ?----------------------------------------- ?Patient is awake and alert, clear speech, steady gait, tolerating p.o.  He is clinically sober.  No evidence of withdrawal or seizures at this time.  He has called his mother who is expected to arrive at the ED at 11:00 AM.  He wishes to wait in the lobby for her at this point.  He agrees to wait near the front desk until his mother arrives, and then he will notify staff that she is here to pick him up. ? ?Social work discussing resources with the patient at this time as well. ? ? ?  ?Sharman Cheek, MD ?06/19/21 1052 ? ?

## 2021-06-20 LAB — LEVETIRACETAM LEVEL: Levetiracetam Lvl: 6.3 ug/mL — ABNORMAL LOW (ref 10.0–40.0)

## 2021-11-22 ENCOUNTER — Emergency Department: Payer: Self-pay

## 2021-11-22 ENCOUNTER — Other Ambulatory Visit: Payer: Self-pay

## 2021-11-22 ENCOUNTER — Emergency Department
Admission: EM | Admit: 2021-11-22 | Discharge: 2021-11-23 | Disposition: A | Discharge status: Home / Self Care | Payer: Self-pay | Attending: Student in an Organized Health Care Education/Training Program | Admitting: Student in an Organized Health Care Education/Training Program

## 2021-11-22 DIAGNOSIS — Y908 Blood alcohol level of 240 mg/100 ml or more: Secondary | ICD-10-CM | POA: Insufficient documentation

## 2021-11-22 DIAGNOSIS — F1010 Alcohol abuse, uncomplicated: Secondary | ICD-10-CM

## 2021-11-22 DIAGNOSIS — F10120 Alcohol abuse with intoxication, uncomplicated: Secondary | ICD-10-CM | POA: Insufficient documentation

## 2021-11-22 DIAGNOSIS — R4182 Altered mental status, unspecified: Secondary | ICD-10-CM | POA: Insufficient documentation

## 2021-11-22 DIAGNOSIS — Z760 Encounter for issue of repeat prescription: Secondary | ICD-10-CM

## 2021-11-22 HISTORY — DX: Alcoholic cirrhosis of liver without ascites: K70.30

## 2021-11-22 LAB — COMPREHENSIVE METABOLIC PANEL
ALT: 67 U/L — ABNORMAL HIGH (ref 0–44)
AST: 156 U/L — ABNORMAL HIGH (ref 15–41)
Albumin: 3.2 g/dL — ABNORMAL LOW (ref 3.5–5.0)
Alkaline Phosphatase: 185 U/L — ABNORMAL HIGH (ref 38–126)
Anion gap: 10 (ref 5–15)
BUN: 8 mg/dL (ref 6–20)
CO2: 23 mmol/L (ref 22–32)
Calcium: 8.3 mg/dL — ABNORMAL LOW (ref 8.9–10.3)
Chloride: 112 mmol/L — ABNORMAL HIGH (ref 98–111)
Creatinine, Ser: 0.45 mg/dL — ABNORMAL LOW (ref 0.61–1.24)
GFR, Estimated: >60 mL/min (ref >60)
Glucose, Bld: 99 mg/dL (ref 70–99)
Potassium: 3.7 mmol/L (ref 3.5–5.1)
Sodium: 145 mmol/L (ref 135–145)
Total Bilirubin: 3.6 mg/dL — ABNORMAL HIGH (ref 0.3–1.2)
Total Protein: 7 g/dL (ref 6.5–8.1)

## 2021-11-22 LAB — CBC
HCT: 36.5 % — ABNORMAL LOW (ref 39.0–52.0)
Hemoglobin: 12.1 g/dL — ABNORMAL LOW (ref 13.0–17.0)
MCH: 29.2 pg (ref 26.0–34.0)
MCHC: 33.2 g/dL (ref 30.0–36.0)
MCV: 88.2 fL (ref 80.0–100.0)
Platelets: 57 10*3/uL — ABNORMAL LOW (ref 150–400)
RBC: 4.14 MIL/uL — ABNORMAL LOW (ref 4.22–5.81)
RDW: 17.2 % — ABNORMAL HIGH (ref 11.5–15.5)
WBC: 4.4 10*3/uL (ref 4.0–10.5)
nRBC: 0 % (ref 0.0–0.2)

## 2021-11-22 LAB — ETHANOL: Alcohol, Ethyl (B): 404 mg/dL (ref <10)

## 2021-11-22 LAB — AMMONIA: Ammonia: 56 umol/L — ABNORMAL HIGH (ref 9–35)

## 2021-11-22 NOTE — ED Provider Notes (Signed)
[facility] Provider Note    Event Date/Time   First MD Initiated Contact with Patient 11/22/21 1828     (approximate)   History   Alcohol Problem   Level V Caveat:  intoxicated HPI  Brett Washington is a 41 y.o. male history of EtOH abuse presents to the ER reportedly intoxicated.  Patient unable provide much additional history.  No report of trauma.     Physical Exam   Triage Vital Signs: ED Triage Vitals  Enc Vitals Group     BP 11/22/21 1740 (!) 165/141     Pulse Rate 11/22/21 1740 74     Resp 11/22/21 1740 12     Temp 11/22/21 1740 98.6 F (37 C)     Temp Source 11/22/21 1925 Oral     SpO2 11/22/21 1740 100 %     Weight 11/22/21 1745 185 lb (83.9 kg)     Height --      Head Circumference --      Peak Flow --      Pain Score 11/22/21 1745 8     Pain Loc --      Pain Edu? --      Excl. in [place]? --     Most recent vital signs: Vitals:   11/22/21 2035 11/22/21 2100  BP: (!) 103/58 102/66  Pulse: 91 (!) 103  Resp:    Temp:    SpO2: 99% 100%     Constitutional: Sounds heavily of alcohol, intoxicated. Eyes: Conjunctivae are normal.  Head: Atraumatic. Nose: No congestion/rhinnorhea. Mouth/Throat: Mucous membranes are moist.   Neck: Painless ROM.  Cardiovascular:   Good peripheral circulation. Respiratory: Normal respiratory effort.  No retractions.  Gastrointestinal: Soft and nontender.  Musculoskeletal:  no deformity Neurologic:  MAE spontaneously.  Drowsy but will open eyes and look around the room.  No seizure-like activity.   Skin:  Skin is warm, dry and intact. No rash noted. Psychiatric: unable to assess    ED Results / Procedures / Treatments   Labs (all labs ordered are listed, but only abnormal results are displayed) Labs Reviewed  COMPREHENSIVE METABOLIC PANEL - Abnormal; Notable for the following components:      Result Value   Chloride 112 (*)    Creatinine, Ser 0.45 (*)    Calcium 8.3 (*)    Albumin  3.2 (*)    AST 156 (*)    ALT 67 (*)    Alkaline Phosphatase 185 (*)    Total Bilirubin 3.6 (*)    All other components within normal limits  ETHANOL - Abnormal; Notable for the following components:   Alcohol, Ethyl (B) 404 (*)    All other components within normal limits  CBC - Abnormal; Notable for the following components:   RBC 4.14 (*)    Hemoglobin 12.1 (*)    HCT 36.5 (*)    RDW 17.2 (*)    Platelets 57 (*)    All other components within normal limits  AMMONIA - Abnormal; Notable for the following components:   Ammonia 56 (*)    All other components within normal limits  URINE DRUG SCREEN, QUALITATIVE ([facility] ONLY)  ETHANOL     EKG  ED ECG REPORT I, Willy Eddy, the attending physician, personally viewed and interpreted this ECG.   Date: 11/22/2021  EKG Time: 20:08  Rate: 80  Rhythm: sinus  Axis: normal  Intervals:  left  ST&T Change: no stemi, nonspecific st abn  RADIOLOGY Please see ED Course for my review and interpretation.  I personally reviewed all radiographic images ordered to evaluate for the above acute complaints and reviewed radiology reports and findings.  These findings were personally discussed with the patient.  Please see medical record for radiology report.    PROCEDURES:  Critical Care performed: No  Procedures   MEDICATIONS ORDERED IN ED: Medications - No data to display   IMPRESSION / MDM / ASSESSMENT AND PLAN / ED COURSE  I reviewed the triage vital signs and the nursing notes.                              Differential diagnosis includes, but is not limited to, Dehydration, sepsis, pna, uti, hypoglycemia, cva, drug effect, withdrawal, encephalitis   Patient presents to the ER for evaluation of symptoms as described above.  This presenting complaint could reflect a potentially life-threatening illness therefore the patient will be placed on continuous pulse oximetry and telemetry for monitoring.  Laboratory evaluation  will be sent to evaluate for the above complaints.    Clinical Course as of 11/22/21 2306  Thu Nov 22, 2021  1942 Patient's alcohol level is 404.  I suspect his presentation is secondary to significant alcohol ingestion.  Will continue observe. [PR]  2045 CT head on my review and interpretation does not show any evidence of SDH or IPH. [PR]  2136 Patient reassessed.  He is now more alert but still slurring his words very difficult to understand even with an interpreter.  Will continue to observe. [PR]    Clinical Course User Index [PR] Willy Eddy, MD     FINAL CLINICAL IMPRESSION(S) / ED DIAGNOSES   Final diagnoses:  Altered mental status, unspecified altered mental status type  Alcohol abuse     Rx / DC Orders   ED Discharge Orders     None        Note:  This document was prepared using Dragon voice recognition software and may include unintentional dictation errors.    Willy Eddy, MD 11/22/21 2306

## 2021-11-22 NOTE — ED Notes (Signed)
Resumed care from [employer].  Pt sleeping.  Siderails up x 2.

## 2021-11-22 NOTE — ED Notes (Addendum)
Pt pulled both iv's out.  Md aware

## 2021-11-22 NOTE — ED Notes (Signed)
Pt t oct scan  pt awake.  2nd iv started   labs sent.  Ekg done

## 2021-11-22 NOTE — ED Notes (Signed)
Md in with pt.   

## 2021-11-22 NOTE — ED Triage Notes (Signed)
PT coming In today for alcohol detox. Pt difficult to assess with interpretor due to slurring and inability to understand patient.  Last drink 2-3 days ago. Pt normally drinks 3-4 beers

## 2021-11-22 NOTE — ED Notes (Signed)
Pt sleeping. 

## 2021-11-23 MED ORDER — LEVETIRACETAM 1000 MG PO TABS: 1000.0000 mg | ORAL_TABLET | Freq: Two times a day (BID) | ORAL | 0 refills | Status: AC

## 2021-11-23 MED ORDER — LACTULOSE 10 G PO PACK: 10.0000 g | PACK | Freq: Two times a day (BID) | ORAL | 0 refills | Status: AC

## 2021-11-23 MED ORDER — LEVETIRACETAM 500 MG PO TABS
1000.0000 mg | ORAL_TABLET | Freq: Once | ORAL | Status: AC
  Administered 2021-11-23: 1000 mg via ORAL
  Filled 2021-11-23: qty 2

## 2021-11-23 NOTE — ED Provider Notes (Signed)
I assumed care of this patient approximately 2300.  Please see outgoing providers note for full details regarding patient's initial evaluation and assessment.  In brief patient presents extremely intoxicated on or provide any history history.  He does have a history of fairly significant alcohol abuse.  He had a head CT as well as chest x-ray that are unremarkable.  CMP shows chronic mild transaminitis consistent with all abuse.  He also has chronically elevated bilirubin today's is 3.6 compared 3.7.  No other significant electrolyte derangements.  Kidney function is WNL.   CBC without leukocytosis and hemoglobin of 12.1 compared to 9.54 months ago.   Ammonia is 56.  Serum ethanol 404.  Plan is to observe until he is clinically sober and containment reassess.   On my reassessment patient is awake and alert.  He appears clinically sober and has no slurred speech.  He is able to ambulate with steady gait unassisted.  He does state that he drinks quite heavily daily and is working with a department to get help with this.  He does not seem ready to quit consulted for Librium at this time.  He has no other acute concerns at this time and is denying any acute pain nausea vomiting or fever.  He is denying any SI HI or hallucinations.  He is requesting a refill of his Keppra.  In addition to refill for this helped for short course of lactulose for his elevated ammonia which may be chronic due to his alcoholic cirrhosis.  Advised him to follow-up with PCP to have his liver enzymes and ammonia rechecked in about a week as well as at [facility] for more intensive alcohol rehab.  He has no other acute concerns.  Discharged in stable condition.   Past Medical History:  Diagnosis Date   Alcohol abuse    Cirrhosis with alcoholism (HCC)    Heart murmur    Seizures (HCC)       Gilles Chiquito, MD 11/23/21 [phone]

## 2022-02-26 ENCOUNTER — Emergency Department: Payer: Self-pay

## 2022-02-26 ENCOUNTER — Other Ambulatory Visit: Payer: Self-pay

## 2022-02-26 ENCOUNTER — Emergency Department
Admission: EM | Admit: 2022-02-26 | Discharge: 2022-02-26 | Disposition: A | Discharge status: Home / Self Care | Payer: Self-pay | Attending: Emergency Medicine | Admitting: Emergency Medicine

## 2022-02-26 DIAGNOSIS — K7010 Alcoholic hepatitis without ascites: Secondary | ICD-10-CM | POA: Insufficient documentation

## 2022-02-26 DIAGNOSIS — F10920 Alcohol use, unspecified with intoxication, uncomplicated: Secondary | ICD-10-CM

## 2022-02-26 DIAGNOSIS — Y908 Blood alcohol level of 240 mg/100 ml or more: Secondary | ICD-10-CM | POA: Insufficient documentation

## 2022-02-26 DIAGNOSIS — F10120 Alcohol abuse with intoxication, uncomplicated: Secondary | ICD-10-CM | POA: Insufficient documentation

## 2022-02-26 LAB — CBC WITH DIFFERENTIAL/PLATELET
Abs Immature Granulocytes: 0.01 10*3/uL (ref 0.00–0.07)
Basophils Absolute: 0.1 10*3/uL (ref 0.0–0.1)
Basophils Relative: 2 %
Eosinophils Absolute: 0 10*3/uL (ref 0.0–0.5)
Eosinophils Relative: 1 %
HCT: 30.6 % — ABNORMAL LOW (ref 39.0–52.0)
Hemoglobin: 9.8 g/dL — ABNORMAL LOW (ref 13.0–17.0)
Immature Granulocytes: 0 %
Lymphocytes Relative: 42 %
Lymphs Abs: 1.5 10*3/uL (ref 0.7–4.0)
MCH: 30.8 pg (ref 26.0–34.0)
MCHC: 32 g/dL (ref 30.0–36.0)
MCV: 96.2 fL (ref 80.0–100.0)
Monocytes Absolute: 0.4 10*3/uL (ref 0.1–1.0)
Monocytes Relative: 10 %
Neutro Abs: 1.6 10*3/uL — ABNORMAL LOW (ref 1.7–7.7)
Neutrophils Relative %: 45 %
Platelets: 66 10*3/uL — ABNORMAL LOW (ref 150–400)
RBC: 3.18 MIL/uL — ABNORMAL LOW (ref 4.22–5.81)
RDW: 14.2 % (ref 11.5–15.5)
Smear Review: NORMAL
WBC: 3.6 10*3/uL — ABNORMAL LOW (ref 4.0–10.5)
nRBC: 0 % (ref 0.0–0.2)

## 2022-02-26 LAB — BASIC METABOLIC PANEL
Anion gap: 8 (ref 5–15)
BUN: 5 mg/dL — ABNORMAL LOW (ref 6–20)
CO2: 25 mmol/L (ref 22–32)
Calcium: 8 mg/dL — ABNORMAL LOW (ref 8.9–10.3)
Chloride: 107 mmol/L (ref 98–111)
Creatinine, Ser: 0.38 mg/dL — ABNORMAL LOW (ref 0.61–1.24)
GFR, Estimated: >60 mL/min (ref >60)
Glucose, Bld: 102 mg/dL — ABNORMAL HIGH (ref 70–99)
Potassium: 3.3 mmol/L — ABNORMAL LOW (ref 3.5–5.1)
Sodium: 140 mmol/L (ref 135–145)

## 2022-02-26 LAB — HEPATIC FUNCTION PANEL
ALT: 90 U/L — ABNORMAL HIGH (ref 0–44)
AST: 259 U/L — ABNORMAL HIGH (ref 15–41)
Albumin: 2.9 g/dL — ABNORMAL LOW (ref 3.5–5.0)
Alkaline Phosphatase: 168 U/L — ABNORMAL HIGH (ref 38–126)
Bilirubin, Direct: 1.4 mg/dL — ABNORMAL HIGH (ref 0.0–0.2)
Indirect Bilirubin: 2.4 mg/dL — ABNORMAL HIGH (ref 0.3–0.9)
Total Bilirubin: 3.8 mg/dL — ABNORMAL HIGH (ref 0.3–1.2)
Total Protein: 7.1 g/dL (ref 6.5–8.1)

## 2022-02-26 LAB — ETHANOL: Alcohol, Ethyl (B): 479 mg/dL (ref <10)

## 2022-02-26 LAB — MAGNESIUM: Magnesium: 2 mg/dL (ref 1.7–2.4)

## 2022-02-26 MED ORDER — POTASSIUM CHLORIDE CRYS ER 20 MEQ PO TBCR
20.0000 meq | EXTENDED_RELEASE_TABLET | Freq: Once | ORAL | Status: AC
  Administered 2022-02-26: 20 meq via ORAL
  Filled 2022-02-26: qty 1

## 2022-02-26 NOTE — ED Triage Notes (Signed)
Pt laying in woods found by [facility]. EMS called, VS upon their arrival.  Patient with seizure history, smells of urine and found with beer cans. Pt alert and oriented. Pt denies falling he states he just laid down in the woods.   BG 115

## 2022-02-26 NOTE — ED Notes (Signed)
Pt left ED without this writer reviewing his discharge paperwork.

## 2022-02-26 NOTE — ED Provider Notes (Signed)
[facility] Provider Note    Event Date/Time   First MD Initiated Contact with Patient 02/26/22 1521     (approximate)   History   Chief Complaint Near Syncope and Alcohol Intoxication   HPI  Brett Washington is a 41 y.o. male with past medical history of alcohol abuse, seizures, and cirrhosis who presents to the ED for altered mental status.  Patient was reportedly found lying in the woods by a bystander with decreased responsiveness.  On EMS arrival, they noted patient found surrounded by beer cans and smelled of urine.  He admits to consuming about 10 beers today with his last drink coming around 2 PM.  He had initially reported that he did not fall, but patient now tells me that he believes he fell earlier and hit his head.  He thinks he may have lost consciousness, but currently denies any headache or neck pain.  He denies any other complaints including chest pain, abdominal pain, or extremity pain.  He states he drinks on a daily basis, denies any drug use.     Physical Exam   Triage Vital Signs: ED Triage Vitals  Enc Vitals Group     BP 02/26/22 1426 122/87     Pulse Rate 02/26/22 1426 83     Resp 02/26/22 1426 20     Temp 02/26/22 1426 98.7 F (37.1 C)     Temp Source 02/26/22 1426 Oral     SpO2 02/26/22 1426 99 %     Weight --      Height --      Head Circumference --      Peak Flow --      Pain Score 02/26/22 1424 0     Pain Loc --      Pain Edu? --      Excl. in GC? --     Most recent vital signs: Vitals:   02/26/22 1426 02/26/22 1824  BP: 122/87 101/70  Pulse: 83 76  Resp: 20 16  Temp: 98.7 F (37.1 C) 98.3 F (36.8 C)  SpO2: 99% 98%    Constitutional: Intoxicated appearing, alert and oriented. Eyes: Conjunctivae are normal.  Pupils equal, round, and reactive to light bilaterally. Head: Atraumatic. Nose: No congestion/rhinnorhea. Mouth/Throat: Mucous membranes are moist.  Neck: No midline cervical spine tenderness to  palpation. Cardiovascular: Normal rate, regular rhythm. Grossly normal heart sounds.  2+ radial pulses bilaterally. Respiratory: Normal respiratory effort.  No retractions. Lungs CTAB.  No chest wall tenderness to palpation. Gastrointestinal: Soft and nontender. No distention. Musculoskeletal: No lower extremity tenderness nor edema.  No upper extremity bony tenderness to palpation. Neurologic:  Normal speech and language. No gross focal neurologic deficits are appreciated.    ED Results / Procedures / Treatments   Labs (all labs ordered are listed, but only abnormal results are displayed) Labs Reviewed  BASIC METABOLIC PANEL - Abnormal; Notable for the following components:      Result Value   Potassium 3.3 (*)    Glucose, Bld 102 (*)    BUN 5 (*)    Creatinine, Ser 0.38 (*)    Calcium 8.0 (*)    All other components within normal limits  CBC WITH DIFFERENTIAL/PLATELET - Abnormal; Notable for the following components:   WBC 3.6 (*)    RBC 3.18 (*)    Hemoglobin 9.8 (*)    HCT 30.6 (*)    Platelets 66 (*)    Neutro Abs 1.6 (*)  All other components within normal limits  ETHANOL - Abnormal; Notable for the following components:   Alcohol, Ethyl (B) 479 (*)    All other components within normal limits  HEPATIC FUNCTION PANEL - Abnormal; Notable for the following components:   Albumin 2.9 (*)    AST 259 (*)    ALT 90 (*)    Alkaline Phosphatase 168 (*)    Total Bilirubin 3.8 (*)    Bilirubin, Direct 1.4 (*)    Indirect Bilirubin 2.4 (*)    All other components within normal limits  MAGNESIUM    RADIOLOGY CT head reviewed and interpreted by me with no hemorrhage or midline shift.  CT cervical spine reviewed and interpreted by me with no fracture or dislocation.  PROCEDURES:  Critical Care performed: No  Procedures   MEDICATIONS ORDERED IN ED: Medications  potassium chloride SA (KLOR-CON M) CR tablet 20 mEq (20 mEq Oral Given 02/26/22 1612)     IMPRESSION /  MDM / ASSESSMENT AND PLAN / ED COURSE  I reviewed the triage vital signs and the nursing notes.                              41 y.o. male with past medical history of alcohol abuse, seizures, and cirrhosis who presents to the ED after being found laying down in the woods, admits to significant alcohol consumption today.  Patient's presentation is most consistent with acute presentation with potential threat to life or bodily function.  Differential diagnosis includes, but is not limited to, alcohol intoxication, arrhythmia, electrolyte abnormality, dehydration, AKI, intracranial injury, cervical spine injury.  Patient intoxicated but well-appearing, arouses easily to voice and currently denies any complaints.  Vital signs are reassuring, but he does report that he may have hit his head earlier with LOC.  No obvious signs of trauma to his head or neck, we will further assess with CT head and cervical spine.  No evidence of traumatic injury to his trunk or extremities.  We will screen EKG and labs for potential electrolyte abnormality.  CT head and cervical spine are negative for traumatic injury.  Patient has markedly elevated ethanol level at greater than 400, additionally noted to have transaminitis with AST greater than ALT, which appears acute on chronic with mild increase compared to previous.  This is consistent with a mild alcoholic hepatitis, bilirubin remained stable compared to previous and remainder of electrolytes are reassuring, remarkable only for mild hypokalemia.  We will replete potassium, additional labs are reassuring with stable anemia and thrombocytopenia.  Plan to observe patient until he is clinically sober, at which point he would be appropriate for discharge home.  Patient is now awake and alert, eating and drinking without difficulty and ambulating with a steady gait.  He is requesting to be discharged home, was counseled to cut back on alcohol intake and to follow-up with his  PCP.  Patient counseled to return to the ED for new or worsening symptoms, patient agrees with plan.      FINAL CLINICAL IMPRESSION(S) / ED DIAGNOSES   Final diagnoses:  Alcoholic intoxication without complication (HCC)  Alcoholic hepatitis without ascites     Rx / DC Orders   ED Discharge Orders     None        Note:  This document was prepared using Dragon voice recognition software and may include unintentional dictation errors.   Chesley Noon, MD 02/26/22 1945

## 2022-02-26 NOTE — ED Notes (Signed)
Pt reporting he was "tired and laid down."  Pt is poor historian.

## 2022-05-30 ENCOUNTER — Encounter: Payer: Self-pay | Admitting: Emergency Medicine

## 2022-05-30 ENCOUNTER — Emergency Department: Payer: No Typology Code available for payment source

## 2022-05-30 ENCOUNTER — Emergency Department
Admission: EM | Admit: 2022-05-30 | Discharge: 2022-05-31 | Disposition: A | Discharge status: Home / Self Care | Payer: No Typology Code available for payment source | Attending: Emergency Medicine | Admitting: Emergency Medicine

## 2022-05-30 ENCOUNTER — Other Ambulatory Visit: Payer: Self-pay

## 2022-05-30 DIAGNOSIS — F10921 Alcohol use, unspecified with intoxication delirium: Secondary | ICD-10-CM | POA: Diagnosis present

## 2022-05-30 DIAGNOSIS — K7682 Hepatic encephalopathy: Secondary | ICD-10-CM | POA: Insufficient documentation

## 2022-05-30 DIAGNOSIS — K7030 Alcoholic cirrhosis of liver without ascites: Secondary | ICD-10-CM | POA: Diagnosis not present

## 2022-05-30 DIAGNOSIS — Y908 Blood alcohol level of 240 mg/100 ml or more: Secondary | ICD-10-CM | POA: Diagnosis not present

## 2022-05-30 DIAGNOSIS — R569 Unspecified convulsions: Secondary | ICD-10-CM | POA: Diagnosis not present

## 2022-05-30 DIAGNOSIS — D696 Thrombocytopenia, unspecified: Secondary | ICD-10-CM | POA: Diagnosis not present

## 2022-05-30 DIAGNOSIS — F10288 Alcohol dependence with other alcohol-induced disorder: Secondary | ICD-10-CM | POA: Diagnosis present

## 2022-05-30 DIAGNOSIS — Q231 Congenital insufficiency of aortic valve: Secondary | ICD-10-CM

## 2022-05-30 DIAGNOSIS — F10121 Alcohol abuse with intoxication delirium: Secondary | ICD-10-CM | POA: Insufficient documentation

## 2022-05-30 DIAGNOSIS — F1010 Alcohol abuse, uncomplicated: Secondary | ICD-10-CM

## 2022-05-30 DIAGNOSIS — F411 Generalized anxiety disorder: Secondary | ICD-10-CM | POA: Diagnosis not present

## 2022-05-30 DIAGNOSIS — Z7189 Other specified counseling: Secondary | ICD-10-CM | POA: Diagnosis not present

## 2022-05-30 DIAGNOSIS — F32A Depression, unspecified: Secondary | ICD-10-CM | POA: Insufficient documentation

## 2022-05-30 DIAGNOSIS — Z5321 Procedure and treatment not carried out due to patient leaving prior to being seen by health care provider: Secondary | ICD-10-CM | POA: Insufficient documentation

## 2022-05-30 DIAGNOSIS — F1020 Alcohol dependence, uncomplicated: Secondary | ICD-10-CM | POA: Diagnosis present

## 2022-05-30 LAB — COMPREHENSIVE METABOLIC PANEL WITH GFR
ALT: 42 U/L (ref 0–44)
AST: 143 U/L — ABNORMAL HIGH (ref 15–41)
Albumin: 3.2 g/dL — ABNORMAL LOW (ref 3.5–5.0)
Alkaline Phosphatase: 193 U/L — ABNORMAL HIGH (ref 38–126)
Anion gap: 9 (ref 5–15)
BUN: <5 mg/dL — ABNORMAL LOW (ref 6–20)
CO2: 25 mmol/L (ref 22–32)
Calcium: 8.1 mg/dL — ABNORMAL LOW (ref 8.9–10.3)
Chloride: 105 mmol/L (ref 98–111)
Creatinine, Ser: 0.39 mg/dL — ABNORMAL LOW (ref 0.61–1.24)
GFR, Estimated: >60 mL/min
Glucose, Bld: 99 mg/dL (ref 70–99)
Potassium: 3.7 mmol/L (ref 3.5–5.1)
Sodium: 139 mmol/L (ref 135–145)
Total Bilirubin: 3.5 mg/dL — ABNORMAL HIGH (ref 0.3–1.2)
Total Protein: 7.9 g/dL (ref 6.5–8.1)

## 2022-05-30 LAB — CBC
HCT: 34.9 % — ABNORMAL LOW (ref 39.0–52.0)
Hemoglobin: 10.8 g/dL — ABNORMAL LOW (ref 13.0–17.0)
MCH: 28.7 pg (ref 26.0–34.0)
MCHC: 30.9 g/dL (ref 30.0–36.0)
MCV: 92.8 fL (ref 80.0–100.0)
Platelets: 20 10*3/uL — CL (ref 150–400)
RBC: 3.76 MIL/uL — ABNORMAL LOW (ref 4.22–5.81)
RDW: 16.4 % — ABNORMAL HIGH (ref 11.5–15.5)
WBC: 4.7 10*3/uL (ref 4.0–10.5)
nRBC: 0 % (ref 0.0–0.2)

## 2022-05-30 LAB — URINE DRUG SCREEN, QUALITATIVE (ARMC ONLY)
Amphetamines, Ur Screen: NOT DETECTED
Barbiturates, Ur Screen: NOT DETECTED
Benzodiazepine, Ur Scrn: NOT DETECTED
Cannabinoid 50 Ng, Ur ~~LOC~~: NOT DETECTED
Cocaine Metabolite,Ur ~~LOC~~: NOT DETECTED
MDMA (Ecstasy)Ur Screen: NOT DETECTED
Methadone Scn, Ur: NOT DETECTED
Opiate, Ur Screen: NOT DETECTED
Phencyclidine (PCP) Ur S: NOT DETECTED
Tricyclic, Ur Screen: NOT DETECTED

## 2022-05-30 LAB — ACETAMINOPHEN LEVEL: Acetaminophen (Tylenol), Serum: <10 ug/mL — ABNORMAL LOW (ref 10–30)

## 2022-05-30 LAB — ETHANOL: Alcohol, Ethyl (B): 460 mg/dL

## 2022-05-30 LAB — SALICYLATE LEVEL: Salicylate Lvl: <7 mg/dL — ABNORMAL LOW (ref 7.0–30.0)

## 2022-05-30 NOTE — ED Notes (Signed)
To CT

## 2022-05-30 NOTE — Consult Note (Incomplete)
[facility] Consult   Reason for Consult:Psychiatric Evaluation   Referring Physician: Dr. Archie Balboa Patient Identification: Brett Washington MRN:  [id number] Principal Diagnosis: <principal problem not specified> Diagnosis:  Active Problems:   Alcohol withdrawal seizure ([place])   Alcohol use disorder, severe, dependence ([place])   Alcohol intoxication with delirium ([place])   Seizure ([place])   Hepatic encephalopathy ([place])   Alcohol abuse   Advanced directives, counseling/discussion   Alcoholic cirrhosis ([place])   Bicuspid aortic valve   Generalized anxiety disorder   Total Time spent with patient: 1 hour  Subjective: "I don't drink everyday." Brett Washington is a 42 y.o. male patient presented to [facility] ED via law enforcement voluntarily. The patient suspected that his wife was cheating and he began drinking. The patient have a long history of alcohol abuse history. The patient BAL is 460 mg/dl. The patient shared that he and his wife has been separate for more than one and a half years. He states he was living with his father-in -law who   The patient was seen face-to-face by this provider; chart reviewed and consulted with Dr. ED providers and Dr. Lajuana Ripple who ever doc on call on 02/00/2024 due to the care of the patient. It was discussed with the EDP that the patient does/does not meet the criteria to be admitted to the psychiatric inpatient unit.  On evaluation the patient is alert and oriented x4, calm and cooperative, and mood-congruent with affect.  The patient does not appear to be responding to internal or external stimuli. Neither is the patient presenting with any delusional thinking. The patient denies auditory or visual hallucinations. The patient denies any suicidal, homicidal, or self-harm ideations. The patient is not presenting with any psychotic or paranoid behaviors. During an encounter with the patient, he/she could answer questions appropriately.  HPI: Per Dr.  Archie Balboa, Brett Washington is a 42 y.o. male who presents to the emergency department intoxicated and upset.  The patient states that he found out his wife was cheating on him.  Did start drinking alcohol.  Per chart review patient has been seen numerous times in the past for alcohol related issues. No recent illness.   Past Psychiatric History:  Alcohol abuse Seizures ([place])   Risk to Self:   Risk to Others:   Prior Inpatient Therapy:   Prior Outpatient Therapy:    Past Medical History:  Past Medical History:  Diagnosis Date  . Alcohol abuse   . Cirrhosis with alcoholism ([place])   . Heart murmur   . Seizures ([place])     Past Surgical History:  Procedure Laterality Date  . ESOPHAGOGASTRODUODENOSCOPY (EGD) WITH PROPOFOL N/A 11/02/2019   Procedure: ESOPHAGOGASTRODUODENOSCOPY (EGD) WITH PROPOFOL;  Surgeon: Lin Landsman, MD;  Location: [facility];  Service: Gastroenterology;  Laterality: N/A;  . HAND SURGERY     Family History:  Family History  Problem Relation Age of Onset  . Diabetes Mellitus II Mother    Family Psychiatric  History: History reviewed. No pertinent family psychiatric history. Social History:  Social History   Substance and Sexual Activity  Alcohol Use Yes  . Alcohol/week: 56.0 standard drinks of alcohol  . Types: 56 Cans of beer per week     Social History   Substance and Sexual Activity  Drug Use No    Social History   Socioeconomic History  . Marital status: Married    Spouse name: Not on file  . Number of children: Not on file  . Years  of education: Not on file  . Highest education level: Not on file  Occupational History  . Not on file  Tobacco Use  . Smoking status: Never  . Smokeless tobacco: Never  Vaping Use  . Vaping Use: Never used  Substance and Sexual Activity  . Alcohol use: Yes    Alcohol/week: 56.0 standard drinks of alcohol    Types: 56 Cans of beer per week  . Drug use: No  . Sexual activity: Not on file  Other  Topics Concern  . Not on file  Social History Narrative  . Not on file   Social Determinants of Health   Financial Resource Strain: Not on file  Food Insecurity: Not on file  Transportation Needs: Not on file  Physical Activity: Not on file  Stress: Not on file  Social Connections: Not on file   Additional Social History:    Allergies:  No Known Allergies  Labs:  Results for orders placed or performed during the hospital encounter of 05/30/22 (from the past 48 hour(s))  Comprehensive metabolic panel     Status: Abnormal   Collection Time: 05/30/22  5:59 PM  Result Value Ref Range   Sodium 139 135 - 145 mmol/L   Potassium 3.7 3.5 - 5.1 mmol/L   Chloride 105 98 - 111 mmol/L   CO2 25 22 - 32 mmol/L   Glucose, Bld 99 70 - 99 mg/dL    Comment: Glucose reference range applies only to samples taken after fasting for at least 8 hours.   BUN <5 (L) 6 - 20 mg/dL   Creatinine, Ser 0.39 (L) 0.61 - 1.24 mg/dL   Calcium 8.1 (L) 8.9 - 10.3 mg/dL   Total Protein 7.9 6.5 - 8.1 g/dL   Albumin 3.2 (L) 3.5 - 5.0 g/dL   AST 143 (H) 15 - 41 U/L   ALT 42 0 - 44 U/L   Alkaline Phosphatase 193 (H) 38 - 126 U/L   Total Bilirubin 3.5 (H) 0.3 - 1.2 mg/dL   GFR, Estimated >60 >60 mL/min    Comment: (NOTE) Calculated using the CKD-EPI Creatinine Equation (2021)    Anion gap 9 5 - 15    Comment: Performed at [facility], [address] [place], [place] [address]  Ethanol     Status: Abnormal   Collection Time: 05/30/22  5:59 PM  Result Value Ref Range   Alcohol, Ethyl (B) 460 (HH) <10 mg/dL    Comment: CRITICAL RESULT CALLED TO, READ BACK BY AND VERIFIED WITH ASHLEY TREXLER 05/30/22 1842 KLW (NOTE) Lowest detectable limit for serum alcohol is 10 mg/dL.  For medical purposes only. Performed at [facility], [address] [place], [place] [id number]   Salicylate level     Status: Abnormal   Collection Time: 05/30/22  5:59 PM  Result Value Ref Range   Salicylate Lvl  [id number] (L) 7.0 - 30.0 mg/dL    Comment: Performed at [facility], [address] [place], [place] [address]  Acetaminophen level     Status: Abnormal   Collection Time: 05/30/22  5:59 PM  Result Value Ref Range   Acetaminophen (Tylenol), Serum <10 (L) 10 - 30 ug/mL    Comment: (NOTE) Therapeutic concentrations vary significantly. A range of 10-30 ug/mL  may be an effective concentration for many patients. However, some  are best treated at concentrations outside of this range. Acetaminophen concentrations >150 ug/mL at 4 hours after ingestion  and >50 ug/mL at 12 hours after  ingestion are often associated with  toxic reactions.  Performed at [facility], [address] [place], [place] [address]   cbc     Status: Abnormal   Collection Time: 05/30/22  5:59 PM  Result Value Ref Range   WBC 4.7 4.0 - 10.5 K/uL   RBC 3.76 (L) 4.22 - 5.81 MIL/uL   Hemoglobin 10.8 (L) 13.0 - 17.0 g/dL   HCT 34.9 (L) 39.0 - 52.0 %   MCV 92.8 80.0 - 100.0 fL   MCH 28.7 26.0 - 34.0 pg   MCHC 30.9 30.0 - 36.0 g/dL   RDW 16.4 (H) 11.5 - 15.5 %   Platelets 20 (LL) 150 - 400 K/uL    Comment: Immature Platelet Fraction may be clinically indicated, consider ordering this additional test [id number] THIS CRITICAL RESULT HAS VERIFIED AND BEEN CALLED TO NATASHIA WHEATLY BY RADISH PERSAUD ON 02 15 2024 AT 1857, AND HAS BEEN READ BACK.     nRBC 0.0 0.0 - 0.2 %    Comment: Performed at [facility], [address] [place], [place] [address]  Urine Drug Screen, Qualitative     Status: None   Collection Time: 05/30/22  7:52 PM  Result Value Ref Range   Tricyclic, Ur Screen NONE DETECTED NONE DETECTED   Amphetamines, Ur Screen NONE DETECTED NONE DETECTED   MDMA (Ecstasy)Ur Screen NONE DETECTED NONE DETECTED   Cocaine Metabolite,Ur [place] NONE DETECTED NONE DETECTED   Opiate, Ur Screen NONE DETECTED NONE DETECTED   Phencyclidine (PCP) Ur S NONE DETECTED NONE DETECTED   Cannabinoid 50  Ng, Ur [place] NONE DETECTED NONE DETECTED   Barbiturates, Ur Screen NONE DETECTED NONE DETECTED   Benzodiazepine, Ur Scrn NONE DETECTED NONE DETECTED   Methadone Scn, Ur NONE DETECTED NONE DETECTED    Comment: (NOTE) Tricyclics + metabolites, urine    Cutoff 1000 ng/mL Amphetamines + metabolites, urine  Cutoff 1000 ng/mL MDMA (Ecstasy), urine              Cutoff 500 ng/mL Cocaine Metabolite, urine          Cutoff 300 ng/mL Opiate + metabolites, urine        Cutoff 300 ng/mL Phencyclidine (PCP), urine         Cutoff 25 ng/mL Cannabinoid, urine                 Cutoff 50 ng/mL Barbiturates + metabolites, urine  Cutoff 200 ng/mL Benzodiazepine, urine              Cutoff 200 ng/mL Methadone, urine                   Cutoff 300 ng/mL  The urine drug screen provides only a preliminary, unconfirmed analytical test result and should not be used for non-medical purposes. Clinical consideration and professional judgment should be applied to any positive drug screen result due to possible interfering substances. A more specific alternate chemical method must be used in order to obtain a confirmed analytical result. Gas chromatography / mass spectrometry (GC/MS) is the preferred confirm atory method. Performed at [facility], [address] [place], [place] [address]     No current facility-administered medications for this encounter.   Current Outpatient Medications  Medication Sig Dispense Refill  . levETIRAcetam (KEPPRA) 1000 MG tablet Take 1 tablet (1,000 mg total) by mouth 2 (two) times daily. 180 tablet 0  . Multiple Vitamin (MULTIVITAMIN WITH MINERALS) TABS tablet Take 1 tablet by mouth daily. (  Patient not taking: Reported on 11/22/2021)      Musculoskeletal: Strength & Muscle Tone: within normal limits Gait & Station: normal Patient leans: N/A Psychiatric Specialty Exam:  Presentation  General Appearance:  Appropriate for Environment  Eye  Contact: Good  Speech: Clear and Coherent  Speech Volume: Normal  Handedness: Right   Mood and Affect  Mood: Euthymic  Affect: Congruent   Thought Process  Thought Processes: Coherent  Descriptions of Associations:Intact  Orientation:Full (Time, Place and Person)  Thought Content:Logical  History of Schizophrenia/Schizoaffective disorder:No data recorded Duration of Psychotic Symptoms:No data recorded Hallucinations:Hallucinations: None  Ideas of Reference:None  Suicidal Thoughts:Suicidal Thoughts: No  Homicidal Thoughts:Homicidal Thoughts: No   Sensorium  Memory: Immediate Fair; Recent Fair; Remote Fair  Judgment: Fair  Insight: Fair   Community education officer  Concentration: Fair  Attention Span: Fair  Recall: [employer] of Knowledge: Fair  Language: Fair   Psychomotor Activity  Psychomotor Activity: Psychomotor Activity: Normal   Assets  Assets: Communication Skills; Physical Health; Resilience; Social Support   Sleep  Sleep: Sleep: Fair Number of Hours of Sleep: 6   Physical Exam: Physical Exam Vitals and nursing note reviewed.  Constitutional:      Appearance: Normal appearance. He is normal weight.  HENT:     Head: Normocephalic and atraumatic.     Right Ear: External ear normal.     Left Ear: External ear normal.     Nose: Nose normal.     Mouth/Throat:     Mouth: Mucous membranes are dry.  Cardiovascular:     Rate and Rhythm: Normal rate.     Pulses: Normal pulses.  Pulmonary:     Effort: Pulmonary effort is normal.  Musculoskeletal:        General: Normal range of motion.     Cervical back: Normal range of motion and neck supple.  Neurological:     General: No focal deficit present.     Mental Status: He is alert and oriented to person, place, and time.  Psychiatric:        Attention and Perception: Attention and perception normal.        Mood and Affect: Mood normal.        Speech: Speech normal.         Behavior: Behavior normal.        Thought Content: Thought content normal.        Cognition and Memory: Cognition is impaired.        Judgment: Judgment normal.    Review of Systems  Psychiatric/Behavioral:  Positive for depression and substance abuse. The patient has insomnia.   All other systems reviewed and are negative.  Blood pressure 114/77, pulse 77, temperature 97.7 F (36.5 C), resp. rate 18, SpO2 99 %. There is no height or weight on file to calculate BMI.  Treatment Plan Summary: Medication management and Plan   The patient remained under observation overnight and will be discharged in the a.m. once the patient is no longer under the influence of alcohol he is psychiatrically stable to be discharge.  Disposition: No evidence of imminent risk to self or others at present.   Patient does not meet criteria for psychiatric inpatient admission. Supportive therapy provided about ongoing stressors. Discussed crisis plan, support from social network, calling 911, coming to the Emergency Department, and calling Suicide Hotline.  Caroline Sauger, NP 05/30/2022 11:11 PM

## 2022-05-30 NOTE — ED Triage Notes (Signed)
Pt to ED via [facility]. Pt is voluntary at this time. Pt states that earlier today him and his wife got into an argument because he found out that his wife was cheating on him. Pt states that he got upset and started drinking alcohol. Pt states that one of the neighbors calls the cops and that is how he ended up in the ED. Pt states that he was mad and upset and was "close" to having thoughts of harming himself. Pt states that he did not have a plan as to how he would harm himself. Pt denies thoughts of harming others. Pt states that he was also kicked out by his father a few days ago. Pt reports that he has been living him his fathers storage building recently without heat. Pt is cooperative at this time.

## 2022-05-30 NOTE — ED Notes (Signed)
Snack: provided a bag of chips and a cup of icecream.

## 2022-05-30 NOTE — ED Notes (Signed)
Assumed care of patient.

## 2022-05-30 NOTE — ED Notes (Signed)
Patient speaking with NP for TTS consult.

## 2022-05-30 NOTE — BH Assessment (Signed)
Comprehensive Clinical Assessment (CCA) Note  05/30/2022 Jayzon Blakenship [id number]  Chief Complaint:  Chief Complaint  Patient presents with   Psychiatric Evaluation   Mr. Mcclarnon arrived to the ED by way of law enforcement.   He shared, "I was in the street taking a nap, and somebody called the police", "I'm not happy, and I was tired, I just laid down for a little bit, and they called the police officers, I was drunk".  He reports that he is depressed, and has been feeling this way for about a year.  He reports that he has been feeling sad, worrying about his son, hopelessness, problems sleeping from overthinking and worrying. Loss of enjoyment is also identified.  He denied symptoms of anxiety. He denied suicidal ideation or intent.  He denied homicidal ideation or intent. He denied having auditory of visual hallucinations at this time.  He shared that when he was drinking he was having auditory hallucinations.  Stated that he went to pick up his children, and his wife would not allow him to take the children. He became angry because it was his time to take the kids.  She continued to deny him.  Mr. Robertson BAC reported .460. Mr. Lamson shared that he has gone a period without drinking only when hospitalized   Reports he was drinking today when he was really mad  Visit Diagnosis: Alcohol Use Disorder, Depression   CCA Screening, Triage and Referral (STR)  Patient Reported Information How did you hear about [place]? Legal System  Referral name: No data recorded Referral phone number: No data recorded  Whom do you see for routine medical problems? No data recorded Practice/Facility Name: No data recorded Practice/Facility Phone Number: No data recorded Name of Contact: No data recorded Contact Number: No data recorded Contact Fax Number: No data recorded Prescriber Name: No data recorded Prescriber Address (if known): No data recorded  What Is the Reason for Your Visit/Call  Today? Psychiatric Evaluation  How Long Has This Been Causing You Problems? > than 6 months  What Do You Feel Would Help You the Most Today? Alcohol or Drug Use Treatment; Treatment for Depression or other mood problem   Have You Recently Been in Any Inpatient Treatment (Hospital/Detox/Crisis Center/28-Day Program)? No data recorded Name/Location of Program/Hospital:No data recorded How Long Were You There? No data recorded When Were You Discharged? No data recorded  Have You Ever Received Services From [facility] Before? No data recorded Who Do You See at [facility]? No data recorded  Have You Recently Had Any Thoughts About Hurting Yourself? No  Are You Planning to Commit Suicide/Harm Yourself At This time? No   Have you Recently Had Thoughts About [facility]? No  Explanation: No data recorded  Have You Used Any Alcohol or Drugs in the Past 24 Hours? Yes  How Long Ago Did You Use Drugs or Alcohol? No data recorded What Did You Use and How Much? a lot   Do You Currently Have a Therapist/Psychiatrist? No  Name of Therapist/Psychiatrist: None at this time   Have You Been Recently Discharged From Any Office Practice or Programs? No  Explanation of Discharge From Practice/Program: No data recorded    CCA Screening Triage Referral Assessment Type of Contact: Face-to-Face  Is this Initial or Reassessment? No data recorded Date Telepsych consult ordered in [facility]:  No data recorded Time Telepsych consult ordered in [facility]:  No data recorded  Patient Reported Information Reviewed? No data recorded Patient Left  Without Being Seen? No data recorded Reason for Not Completing Assessment: No data recorded  Collateral Involvement: No data recorded  Does Patient Have a [place]? No data recorded Name and Contact of Legal Guardian: No data recorded If Minor and Not Living with Parent(s), Who has Custody? No data recorded Is CPS involved or ever  been involved? Never  Is APS involved or ever been involved? Never   Patient Determined To Be At Risk for Harm To Self or Others Based on Review of Patient Reported Information or Presenting Complaint? No  Method: No Plan  Availability of Means: No data recorded Intent: No data recorded Notification Required: No data recorded Additional Information for Danger to Others Potential: No data recorded Additional Comments for Danger to Others Potential: No data recorded Are There Guns or Other Weapons in Your Home? No  Types of Guns/Weapons: No data recorded Are These Weapons Safely Secured?                            No data recorded Who Could Verify You Are Able To Have These Secured: No data recorded Do You Have any Outstanding Charges, Pending Court Dates, Parole/Probation? No data recorded Contacted To Inform of Risk of Harm To Self or Others: No data recorded  Location of Assessment: [facility] ED   Does Patient Present under Involuntary Commitment? No  IVC Papers Initial File Date: No data recorded  [place] of Residence: [place]   Patient Currently Receiving the Following Services: SAIOP (Substance Abuse Intensive Outpatient Program; Individual Therapy   Determination of Need: Urgent (48 hours)   Options For Referral: Chemical Dependency Intensive Outpatient Therapy (CDIOP); Outpatient Therapy     CCA Biopsychosocial Intake/Chief Complaint:  No data recorded Current Symptoms/Problems: No data recorded  Patient Reported Schizophrenia/Schizoaffective Diagnosis in Past: No data recorded  Strengths: No data recorded Preferences: No data recorded Abilities: No data recorded  Type of Services Patient Feels are Needed: No data recorded  Initial Clinical Notes/Concerns: No data recorded  Mental Health Symptoms Depression:   Change in energy/activity; Hopelessness; Sleep (too much or little)   Duration of Depressive symptoms:  Greater than two weeks   Mania:   N/A    Anxiety:    N/A   Psychosis:   None   Duration of Psychotic symptoms: No data recorded  Trauma:   N/A   Obsessions:   N/A   Compulsions:   N/A   Inattention:   N/A   Hyperactivity/Impulsivity:   N/A   Oppositional/Defiant Behaviors:   N/A   Emotional Irregularity:   Chronic feelings of emptiness   Other Mood/Personality Symptoms:  No data recorded   Mental Status Exam Appearance and self-care  Stature:   Average   Weight:   Average weight   Clothing:  No data recorded  Grooming:   Normal   Cosmetic use:   None   Posture/gait:  No data recorded  Motor activity:   Not Remarkable   Sensorium  Attention:  No data recorded  Concentration:   Normal   Orientation:   X5   Recall/memory:   Normal   Affect and Mood  Affect:   Appropriate   Mood:   Depressed   Relating  Eye contact:   Normal   Facial expression:   Responsive   Attitude toward examiner:   Cooperative   Thought and Language  Speech flow:  Loud   Thought content:   Appropriate  to Mood and Circumstances   Preoccupation:   None   Hallucinations:   None   Organization:  No data recorded  [employer] of Knowledge:   Fair   Intelligence:  No data recorded  Abstraction:  No data recorded  Judgement:  No data recorded  Reality Testing:  No data recorded  Insight:   Poor   Decision Making:   Impulsive   Social Functioning  Social Maturity:  No data recorded  Social Judgement:  No data recorded  Stress  Stressors:   Housing; Teacher, music Ability:   Deficient supports   Skill Deficits:   None   Supports:   Family     Religion: Religion/Spirituality Are You A Religious Person?: Yes What is Your Religious Affiliation?: International aid/development worker: Leisure / Recreation Do You Have Hobbies?: No  Exercise/Diet: Exercise/Diet Do You Exercise?: No Have You Gained or Lost A Significant Amount of Weight in the Past Six  Months?: No Do You Follow a Special Diet?: No Do You Have Any Trouble Sleeping?: Yes   CCA Employment/Education Employment/Work Situation: Employment / Work Situation Employment Situation: Unemployed  Education: Education Is Patient Currently Attending School?: No Last Grade Completed: 6 Did You Nutritional therapist?: No Did You Have An Individualized Education Program (IIEP): No Did You Have Any Difficulty At [employer]?: No Patient's Education Has Been Impacted by Current Illness: No   CCA Family/Childhood History Family and Relationship History: Family history Marital status: Separated Does patient have children?: Yes How many children?: 3 How is patient's relationship with their children?: Good  Childhood History:  Childhood History By whom was/is the patient raised?: Both parents Did patient suffer any verbal/emotional/physical/sexual abuse as a child?: No Did patient suffer from severe childhood neglect?: No Has patient ever been sexually abused/assaulted/raped as an adolescent or adult?: No Was the patient ever a victim of a crime or a disaster?: No Witnessed domestic violence?: No Has patient been affected by domestic violence as an adult?: No  Child/Adolescent Assessment:     CCA Substance Use Alcohol/Drug Use: Alcohol / Drug Use Pain Medications: See PTA Prescriptions: See PTA Over the Counter: See PTA History of alcohol / drug use?: Yes Longest period of sobriety (when/how long): Unable to quantify (a lot) Withdrawal Symptoms: Fever / Chills, Tremors, Sweats, Nausea / Vomiting Substance #1 Name of Substance 1: Alcohol 1 - Age of First Use: 16 1 - Frequency: 7 1 - Last Use / Amount: 05/30/2021 1 - Method of Aquiring: purchasing 1- Route of Use: Oral                       ASAM's:  Six Dimensions of Multidimensional Assessment  Dimension 1:  Acute Intoxication and/or Withdrawal Potential:      Dimension 2:  Biomedical Conditions and  Complications:      Dimension 3:  Emotional, Behavioral, or Cognitive Conditions and Complications:     Dimension 4:  Readiness to Change:     Dimension 5:  Relapse, Continued use, or Continued Problem Potential:     Dimension 6:  Recovery/Living Environment:     ASAM Severity Score:    ASAM Recommended Level of Treatment:     Substance use Disorder (SUD)    Recommendations for Services/Supports/Treatments:    DSM5 Diagnoses: Patient Active Problem List   Diagnosis Date Noted   AMS (altered mental status) 05/19/2021   Hepatic encephalopathy ([place]) 09/27/2020   Bicuspid aortic valve 06/06/2020   Elevated  bilirubin    Full code status    Advanced directives, counseling/discussion    Goals of care, counseling/discussion    Palliative care by specialist    Hematemesis 11/01/2019   Acute hepatic encephalopathy ([place]) 11/01/2019   Leukocytosis 11/01/2019   Abnormal LFTs 11/01/2019   Seizure ([place]) 11/01/2019   Alcohol abuse 11/01/2019   Hepatic encephalopathy ([place])    Hypothermia 06/02/2019   Hypothermia due to cold environment 06/02/2019   Alcohol intoxication with delirium ([place]) 06/02/2019   COVID-19 virus infection 06/02/2019   Generalized anxiety disorder [id number]   Alcoholic cirrhosis ([place]) [id number]   LOC (loss of consciousness) ([place]) 01/11/2017   Alcohol use disorder, severe, dependence ([place]) 11/06/2016   Aspiration pneumonia due to vomit ([place])    Alcohol withdrawal seizure ([place]) 10/31/2016   Thrombocytopenia ([place]) 10/31/2016   Transaminitis 10/31/2016   Hypokalemia 10/31/2016     @[email or web address]$ @  Elmer Bales, Counselor

## 2022-05-30 NOTE — ED Notes (Signed)
Back from CT

## 2022-05-30 NOTE — Consult Note (Signed)
[place] Psychiatry Consult   Reason for Consult:Psychiatric Evaluation   Referring Physician: Dr. Archie Balboa Patient Identification: Brett Washington MRN:  [id number] Principal Diagnosis: <principal problem not specified> Diagnosis:  Active Problems:   Alcohol withdrawal seizure ([place])   Alcohol use disorder, severe, dependence ([place])   Alcohol intoxication with delirium ([place])   Seizure ([place])   Hepatic encephalopathy ([place])   Alcohol abuse   Advanced directives, counseling/discussion   Alcoholic cirrhosis ([place])   Bicuspid aortic valve   Generalized anxiety disorder   Total Time spent with patient: 1 hour  Subjective: "I don't drink everyday." Brett Washington is a 42 y.o. male patient presented to [facility] ED via law enforcement voluntarily. It appears that the patient in question was brought to the emergency department (ED) via law enforcement due to concerns related to alcohol intoxication. The patient has a history of alcohol abuse and was found with a blood alcohol level (BAL) of 460 mg/dl. The patient reported suspicions of infidelity by their spouse, which led to increased alcohol consumption. Additionally, the patient had been living with his father-in-law but was recently asked to leave.  Upon evaluation, the patient was alert and oriented but demonstrated impaired cognition, being oriented only to person and partially to time and place. Despite the high BAL, the patient did not display any signs of acute psychosis, such as hallucinations or delusional thinking. Furthermore, the patient denied any current suicidal, homicidal, or self-harm ideations. The patient's mood was congruent with his affect, and they appeared calm and cooperative during the assessment.  It was determined that the patient would be kept under observation overnight and discharged once he were no longer under the influence of alcohol and deemed psychiatrically stable for discharge. Collaboration with Dr.  Archie Balboa occurred to ensure appropriate care for the patient.  HPI: Per Dr. Archie Balboa, Brett Washington is a 42 y.o. male who presents to the emergency department intoxicated and upset.  The patient states that he found out his wife was cheating on him.  Did start drinking alcohol.  Per chart review patient has been seen numerous times in the past for alcohol related issues. No recent illness.   Past Psychiatric History:  Alcohol abuse Seizures ([place])   Risk to Self:   Risk to Others:   Prior Inpatient Therapy:   Prior Outpatient Therapy:    Past Medical History:  Past Medical History:  Diagnosis Date   Alcohol abuse    Cirrhosis with alcoholism ([place])    Heart murmur    Seizures ([place])     Past Surgical History:  Procedure Laterality Date   ESOPHAGOGASTRODUODENOSCOPY (EGD) WITH PROPOFOL N/A 11/02/2019   Procedure: ESOPHAGOGASTRODUODENOSCOPY (EGD) WITH PROPOFOL;  Surgeon: Lin Landsman, MD;  Location: [facility] ENDOSCOPY;  Service: Gastroenterology;  Laterality: N/A;   HAND SURGERY     Family History:  Family History  Problem Relation Age of Onset   Diabetes Mellitus II Mother    Family Psychiatric  History: History reviewed. No pertinent family psychiatric history. Social History:  Social History   Substance and Sexual Activity  Alcohol Use Yes   Alcohol/week: 56.0 standard drinks of alcohol   Types: 56 Cans of beer per week     Social History   Substance and Sexual Activity  Drug Use No    Social History   Socioeconomic History   Marital status: Married    Spouse name: Not on file   Number of children: Not on file   Years of education: Not  on file   Highest education level: Not on file  Occupational History   Not on file  Tobacco Use   Smoking status: Never   Smokeless tobacco: Never  Vaping Use   Vaping Use: Never used  Substance and Sexual Activity   Alcohol use: Yes    Alcohol/week: 56.0 standard drinks of alcohol    Types: 56 Cans of beer per week    Drug use: No   Sexual activity: Not on file  Other Topics Concern   Not on file  Social History Narrative   Not on file   Social Determinants of Health   Financial Resource Strain: Not on file  Food Insecurity: Not on file  Transportation Needs: Not on file  Physical Activity: Not on file  Stress: Not on file  Social Connections: Not on file   Additional Social History:    Allergies:  No Known Allergies  Labs:  Results for orders placed or performed during the hospital encounter of 05/30/22 (from the past 48 hour(s))  Comprehensive metabolic panel     Status: Abnormal   Collection Time: 05/30/22  5:59 PM  Result Value Ref Range   Sodium 139 135 - 145 mmol/L   Potassium 3.7 3.5 - 5.1 mmol/L   Chloride 105 98 - 111 mmol/L   CO2 25 22 - 32 mmol/L   Glucose, Bld 99 70 - 99 mg/dL    Comment: Glucose reference range applies only to samples taken after fasting for at least 8 hours.   BUN <5 (L) 6 - 20 mg/dL   Creatinine, Ser 0.39 (L) 0.61 - 1.24 mg/dL   Calcium 8.1 (L) 8.9 - 10.3 mg/dL   Total Protein 7.9 6.5 - 8.1 g/dL   Albumin 3.2 (L) 3.5 - 5.0 g/dL   AST 143 (H) 15 - 41 U/L   ALT 42 0 - 44 U/L   Alkaline Phosphatase 193 (H) 38 - 126 U/L   Total Bilirubin 3.5 (H) 0.3 - 1.2 mg/dL   GFR, Estimated >60 >60 mL/min    Comment: (NOTE) Calculated using the CKD-EPI Creatinine Equation (2021)    Anion gap 9 5 - 15    Comment: Performed at [facility], [address] [place], [place] [address]  Ethanol     Status: Abnormal   Collection Time: 05/30/22  5:59 PM  Result Value Ref Range   Alcohol, Ethyl (B) 460 (HH) <10 mg/dL    Comment: CRITICAL RESULT CALLED TO, READ BACK BY AND VERIFIED WITH ASHLEY TREXLER 05/30/22 1842 KLW (NOTE) Lowest detectable limit for serum alcohol is 10 mg/dL.  For medical purposes only. Performed at [facility], [address] [place], [place] [id number]   Salicylate level     Status: Abnormal   Collection Time: 05/30/22   5:59 PM  Result Value Ref Range   Salicylate Lvl [id number] (L) 7.0 - 30.0 mg/dL    Comment: Performed at [facility], [address] [place], [place] [address]  Acetaminophen level     Status: Abnormal   Collection Time: 05/30/22  5:59 PM  Result Value Ref Range   Acetaminophen (Tylenol), Serum <10 (L) 10 - 30 ug/mL    Comment: (NOTE) Therapeutic concentrations vary significantly. A range of 10-30 ug/mL  may be an effective concentration for many patients. However, some  are best treated at concentrations outside of this range. Acetaminophen concentrations >150 ug/mL at 4 hours after ingestion  and >50 ug/mL at 12 hours after ingestion are often  associated with  toxic reactions.  Performed at [facility], [address] [place], [place] [address]   cbc     Status: Abnormal   Collection Time: 05/30/22  5:59 PM  Result Value Ref Range   WBC 4.7 4.0 - 10.5 K/uL   RBC 3.76 (L) 4.22 - 5.81 MIL/uL   Hemoglobin 10.8 (L) 13.0 - 17.0 g/dL   HCT 34.9 (L) 39.0 - 52.0 %   MCV 92.8 80.0 - 100.0 fL   MCH 28.7 26.0 - 34.0 pg   MCHC 30.9 30.0 - 36.0 g/dL   RDW 16.4 (H) 11.5 - 15.5 %   Platelets 20 (LL) 150 - 400 K/uL    Comment: Immature Platelet Fraction may be clinically indicated, consider ordering this additional test [id number] THIS CRITICAL RESULT HAS VERIFIED AND BEEN CALLED TO NATASHIA WHEATLY BY RADISH PERSAUD ON 02 15 2024 AT 1857, AND HAS BEEN READ BACK.     nRBC 0.0 0.0 - 0.2 %    Comment: Performed at [facility], [address] [place], [place] [address]  Urine Drug Screen, Qualitative     Status: None   Collection Time: 05/30/22  7:52 PM  Result Value Ref Range   Tricyclic, Ur Screen NONE DETECTED NONE DETECTED   Amphetamines, Ur Screen NONE DETECTED NONE DETECTED   MDMA (Ecstasy)Ur Screen NONE DETECTED NONE DETECTED   Cocaine Metabolite,Ur [place] NONE DETECTED NONE DETECTED   Opiate, Ur Screen NONE DETECTED NONE DETECTED   Phencyclidine (PCP)  Ur S NONE DETECTED NONE DETECTED   Cannabinoid 50 Ng, Ur [place] NONE DETECTED NONE DETECTED   Barbiturates, Ur Screen NONE DETECTED NONE DETECTED   Benzodiazepine, Ur Scrn NONE DETECTED NONE DETECTED   Methadone Scn, Ur NONE DETECTED NONE DETECTED    Comment: (NOTE) Tricyclics + metabolites, urine    Cutoff 1000 ng/mL Amphetamines + metabolites, urine  Cutoff 1000 ng/mL MDMA (Ecstasy), urine              Cutoff 500 ng/mL Cocaine Metabolite, urine          Cutoff 300 ng/mL Opiate + metabolites, urine        Cutoff 300 ng/mL Phencyclidine (PCP), urine         Cutoff 25 ng/mL Cannabinoid, urine                 Cutoff 50 ng/mL Barbiturates + metabolites, urine  Cutoff 200 ng/mL Benzodiazepine, urine              Cutoff 200 ng/mL Methadone, urine                   Cutoff 300 ng/mL  The urine drug screen provides only a preliminary, unconfirmed analytical test result and should not be used for non-medical purposes. Clinical consideration and professional judgment should be applied to any positive drug screen result due to possible interfering substances. A more specific alternate chemical method must be used in order to obtain a confirmed analytical result. Gas chromatography / mass spectrometry (GC/MS) is the preferred confirm atory method. Performed at [facility], [address] [place], [place] [address]     No current facility-administered medications for this encounter.   Current Outpatient Medications  Medication Sig Dispense Refill   levETIRAcetam (KEPPRA) 1000 MG tablet Take 1 tablet (1,000 mg total) by mouth 2 (two) times daily. 180 tablet 0   Multiple Vitamin (MULTIVITAMIN WITH MINERALS) TABS tablet Take 1 tablet by mouth daily. (Patient not taking:  Reported on 11/22/2021)      Musculoskeletal: Strength & Muscle Tone: within normal limits Gait & Station: normal Patient leans: N/A Psychiatric Specialty Exam:  Presentation  General Appearance:  Appropriate for  Environment  Eye Contact: Good  Speech: Clear and Coherent  Speech Volume: Normal  Handedness: Right   Mood and Affect  Mood: Euthymic  Affect: Congruent   Thought Process  Thought Processes: Coherent  Descriptions of Associations:Intact  Orientation:Full (Time, Place and Person)  Thought Content:Logical  History of Schizophrenia/Schizoaffective disorder:No data recorded Duration of Psychotic Symptoms:No data recorded Hallucinations:Hallucinations: None  Ideas of Reference:None  Suicidal Thoughts:Suicidal Thoughts: No  Homicidal Thoughts:Homicidal Thoughts: No   Sensorium  Memory: Immediate Fair; Recent Fair; Remote Fair  Judgment: Fair  Insight: Fair   Community education officer  Concentration: Fair  Attention Span: Fair  Recall: [employer] of Knowledge: Fair  Language: Fair   Psychomotor Activity  Psychomotor Activity: Psychomotor Activity: Normal   Assets  Assets: Communication Skills; Physical Health; Resilience; Social Support   Sleep  Sleep: Sleep: Fair Number of Hours of Sleep: 6   Physical Exam: Physical Exam Vitals and nursing note reviewed.  Constitutional:      Appearance: Normal appearance. He is normal weight.  HENT:     Head: Normocephalic and atraumatic.     Right Ear: External ear normal.     Left Ear: External ear normal.     Nose: Nose normal.     Mouth/Throat:     Mouth: Mucous membranes are dry.  Cardiovascular:     Rate and Rhythm: Normal rate.     Pulses: Normal pulses.  Pulmonary:     Effort: Pulmonary effort is normal.  Musculoskeletal:        General: Normal range of motion.     Cervical back: Normal range of motion and neck supple.  Neurological:     General: No focal deficit present.     Mental Status: He is alert and oriented to person, place, and time.  Psychiatric:        Attention and Perception: Attention and perception normal.        Mood and Affect: Mood normal.        Speech:  Speech normal.        Behavior: Behavior normal.        Thought Content: Thought content normal.        Cognition and Memory: Cognition is impaired.        Judgment: Judgment normal.    Review of Systems  Psychiatric/Behavioral:  Positive for depression and substance abuse. The patient has insomnia.   All other systems reviewed and are negative.  Blood pressure 114/77, pulse 77, temperature 97.7 F (36.5 C), resp. rate 18, SpO2 99 %. There is no height or weight on file to calculate BMI.  Treatment Plan Summary: Medication management and Plan   The patient remained under observation overnight and will be discharged in the a.m. once the patient is no longer under the influence of alcohol he is psychiatrically stable to be discharge.  Disposition: No evidence of imminent risk to self or others at present.   Patient does not meet criteria for psychiatric inpatient admission. Supportive therapy provided about ongoing stressors. Discussed crisis plan, support from social network, calling 911, coming to the Emergency Department, and calling Suicide Hotline.  Caroline Sauger, NP 05/30/2022 11:11 PM

## 2022-05-30 NOTE — ED Provider Notes (Signed)
[facility] Provider Note    Event Date/Time   First MD Initiated Contact with Patient 05/30/22 1827     (approximate)   History   Psychiatric Evaluation   HPI  Brett Washington is a 42 y.o. male who presents to the emergency department intoxicated and upset.  The patient states that he found out his wife was cheating on him.  Did start drinking alcohol.  Per chart review patient has been seen numerous times in the past for alcohol related issues. No recent illness.     Physical Exam   Triage Vital Signs: ED Triage Vitals  Enc Vitals Group     BP 05/30/22 1802 (!) 104/58     Pulse Rate 05/30/22 1802 84     Resp 05/30/22 1802 17     Temp 05/30/22 1802 97.7 F (36.5 C)     Temp Source 05/30/22 1802 Oral     SpO2 05/30/22 1802 95 %     Weight --      Height --      Head Circumference --      Peak Flow --      Pain Score 05/30/22 1757 0     Pain Loc --      Pain Edu? --      Excl. in [place]? --     Most recent vital signs: Vitals:   05/30/22 1802  BP: (!) 104/58  Pulse: 84  Resp: 17  Temp: 97.7 F (36.5 C)  SpO2: 95%   General: Intoxicated. CV:  Good peripheral perfusion. Regular rate and rhythm. Resp:  Normal effort. Lungs clear. Abd:  No distention.    ED Results / Procedures / Treatments   Labs (all labs ordered are listed, but only abnormal results are displayed) Labs Reviewed  COMPREHENSIVE METABOLIC PANEL - Abnormal; Notable for the following components:      Result Value   BUN <5 (*)    Creatinine, Ser 0.39 (*)    Calcium 8.1 (*)    Albumin 3.2 (*)    AST 143 (*)    Alkaline Phosphatase 193 (*)    Total Bilirubin 3.5 (*)    All other components within normal limits  ETHANOL - Abnormal; Notable for the following components:   Alcohol, Ethyl (B) 460 (*)    All other components within normal limits  SALICYLATE LEVEL - Abnormal; Notable for the following components:   Salicylate Lvl [id number] (*)    All other components  within normal limits  ACETAMINOPHEN LEVEL - Abnormal; Notable for the following components:   Acetaminophen (Tylenol), Serum <10 (*)    All other components within normal limits  CBC  URINE DRUG SCREEN, QUALITATIVE ([facility] ONLY)     EKG  None   RADIOLOGY I independently interpreted and visualized the CT head. My interpretation: No bleed Radiology interpretation:  IMPRESSION:  1. No acute intracranial abnormality.  2. Diffuse loss of parenchymal volume consistent with atrophy.     PROCEDURES:  Critical Care performed: No  Procedures   MEDICATIONS ORDERED IN ED: Medications - No data to display   IMPRESSION / MDM / [place] / ED COURSE  I reviewed the triage vital signs and the nursing notes.                              Differential diagnosis includes, but is not limited to, alcohol abuse, intracranial process.  Patient's presentation  is most consistent with acute presentation with potential threat to life or bodily function.  Patient presents to the emergency department today.  He appears intoxicated.  He was quite upset that he found out that his wife is cheating on him.  He does not give great history.  Blood work here does show sequelae of alcohol abuse.  He has known thrombocytopenia although it is worse today.  Given that he is altered while I think is likely secondary to alcohol did check a head CT to make sure there is no intracranial bleed.  This was negative.  Will plan on having psychiatry evaluate once sober.   FINAL CLINICAL IMPRESSION(S) / ED DIAGNOSES   Final diagnoses:  Alcohol abuse  Depression, unspecified depression type    Note:  This document was prepared using Dragon voice recognition software and may include unintentional dictation errors.    Nance Pear, MD 05/30/22 2108

## 2022-05-30 NOTE — ED Notes (Signed)
Patient is up to the bathroom for urine sample.

## 2022-05-31 ENCOUNTER — Emergency Department
Admission: EM | Admit: 2022-05-31 | Discharge: 2022-05-31 | Disposition: A | Discharge status: Home / Self Care | Payer: No Typology Code available for payment source | Source: Home / Self Care

## 2022-05-31 DIAGNOSIS — F10920 Alcohol use, unspecified with intoxication, uncomplicated: Secondary | ICD-10-CM | POA: Insufficient documentation

## 2022-05-31 DIAGNOSIS — Y908 Blood alcohol level of 240 mg/100 ml or more: Secondary | ICD-10-CM | POA: Insufficient documentation

## 2022-05-31 DIAGNOSIS — Z5321 Procedure and treatment not carried out due to patient leaving prior to being seen by health care provider: Secondary | ICD-10-CM | POA: Insufficient documentation

## 2022-05-31 DIAGNOSIS — F32A Depression, unspecified: Secondary | ICD-10-CM

## 2022-05-31 LAB — COMPREHENSIVE METABOLIC PANEL
ALT: 50 U/L — ABNORMAL HIGH (ref 0–44)
AST: 172 U/L — ABNORMAL HIGH (ref 15–41)
Albumin: 3 g/dL — ABNORMAL LOW (ref 3.5–5.0)
Alkaline Phosphatase: 194 U/L — ABNORMAL HIGH (ref 38–126)
Anion gap: 14 (ref 5–15)
BUN: 9 mg/dL (ref 6–20)
CO2: 22 mmol/L (ref 22–32)
Calcium: 8.5 mg/dL — ABNORMAL LOW (ref 8.9–10.3)
Chloride: 105 mmol/L (ref 98–111)
Creatinine, Ser: 0.42 mg/dL — ABNORMAL LOW (ref 0.61–1.24)
GFR, Estimated: >60 mL/min (ref >60)
Glucose, Bld: 105 mg/dL — ABNORMAL HIGH (ref 70–99)
Potassium: 3.5 mmol/L (ref 3.5–5.1)
Sodium: 141 mmol/L (ref 135–145)
Total Bilirubin: 3.4 mg/dL — ABNORMAL HIGH (ref 0.3–1.2)
Total Protein: 7.7 g/dL (ref 6.5–8.1)

## 2022-05-31 LAB — URINE DRUG SCREEN, QUALITATIVE (ARMC ONLY)
Amphetamines, Ur Screen: NOT DETECTED
Barbiturates, Ur Screen: NOT DETECTED
Benzodiazepine, Ur Scrn: NOT DETECTED
Cannabinoid 50 Ng, Ur ~~LOC~~: NOT DETECTED
Cocaine Metabolite,Ur ~~LOC~~: NOT DETECTED
MDMA (Ecstasy)Ur Screen: NOT DETECTED
Methadone Scn, Ur: NOT DETECTED
Opiate, Ur Screen: NOT DETECTED
Phencyclidine (PCP) Ur S: NOT DETECTED
Tricyclic, Ur Screen: NOT DETECTED

## 2022-05-31 LAB — ETHANOL: Alcohol, Ethyl (B): 393 mg/dL (ref <10)

## 2022-05-31 NOTE — ED Provider Notes (Signed)
-----------------------------------------   6:09 AM on 05/31/2022 -----------------------------------------   Patient was cleared by psychiatry.  Anticipate he may be discharged home once he is sober and ambulatory later this morning.   Paulette Blanch, MD 05/31/22 [phone]

## 2022-05-31 NOTE — ED Triage Notes (Signed)
Pt was brought to the ED via [facility] by [employer] voluntary. She sts that pt has a ETOH level of 0.37 by a breathalyzer test. Per Gilford Rile, pt has a bed at [facility] waiting for him when his ETOH level goes below at 0.20. Walker sts that if pt is able to be D/C before 1900 today than call [facility] and they will transport to [facility]. Otherwise they are wondering if pt can be placed on medical hold until the AM.

## 2022-05-31 NOTE — ED Notes (Signed)
Patient is resting in bed.

## 2022-05-31 NOTE — ED Notes (Signed)
Hospital meal provided.  100% consumed, pt tolerated w/o complaints.  Waste discarded appropriately.   

## 2022-05-31 NOTE — ED Provider Notes (Signed)
-----------------------------------------   8:12 AM on 05/31/2022 -----------------------------------------   Blood pressure 98/62, pulse 98, temperature 98.6 F (37 C), temperature source Oral, resp. rate 15, SpO2 100 %.  The patient is calm and cooperative at this time.  There have been no acute events since the last update.  Evaluated by psychiatry yesterday and cleared.  Does not meet inpatient criteria.  Evaluated by social work and ultimately recommended discharge with outpatient resources once clinically sober.  Patient clinically sober this morning.  Ambulating to the bathroom without any difficulties.  States that he wants to go home.  Discharged with information for detox and resources.  Given return precautions for worsening symptoms.    Nathaniel Man, MD 05/31/22 [phone]

## 2022-05-31 NOTE — ED Notes (Signed)
Vol /psych consult complete  / pt does not meet cirteria for psych inpatient admission

## 2022-05-31 NOTE — ED Notes (Signed)
Alcohol level 393 MD informed. Still unable to locate pt

## 2022-05-31 NOTE — ED Notes (Signed)
Ambulates safely, able to eat and drink. Alert and oriented X4. Appears clinically sober and safe for discharge.

## 2022-05-31 NOTE — ED Notes (Signed)
Pt called for recollect of lav and not answering

## 2023-01-08 ENCOUNTER — Encounter: Payer: Self-pay | Admitting: Emergency Medicine

## 2023-01-08 ENCOUNTER — Other Ambulatory Visit: Payer: Self-pay

## 2023-01-08 ENCOUNTER — Emergency Department: Payer: MEDICAID

## 2023-01-08 ENCOUNTER — Emergency Department
Admission: EM | Admit: 2023-01-08 | Discharge: 2023-01-09 | Disposition: A | Discharge status: Home / Self Care | Payer: MEDICAID | Attending: Emergency Medicine | Admitting: Emergency Medicine

## 2023-01-08 DIAGNOSIS — F10129 Alcohol abuse with intoxication, unspecified: Secondary | ICD-10-CM | POA: Insufficient documentation

## 2023-01-08 DIAGNOSIS — Y908 Blood alcohol level of 240 mg/100 ml or more: Secondary | ICD-10-CM | POA: Diagnosis not present

## 2023-01-08 DIAGNOSIS — R4182 Altered mental status, unspecified: Secondary | ICD-10-CM | POA: Diagnosis present

## 2023-01-08 DIAGNOSIS — G40909 Epilepsy, unspecified, not intractable, without status epilepticus: Secondary | ICD-10-CM

## 2023-01-08 DIAGNOSIS — F10929 Alcohol use, unspecified with intoxication, unspecified: Secondary | ICD-10-CM

## 2023-01-08 DIAGNOSIS — R569 Unspecified convulsions: Secondary | ICD-10-CM | POA: Diagnosis not present

## 2023-01-08 LAB — COMPREHENSIVE METABOLIC PANEL
ALT: 44 U/L (ref 0–44)
AST: 86 U/L — ABNORMAL HIGH (ref 15–41)
Albumin: 2.5 g/dL — ABNORMAL LOW (ref 3.5–5.0)
Alkaline Phosphatase: 177 U/L — ABNORMAL HIGH (ref 38–126)
Anion gap: 9 (ref 5–15)
BUN: <5 mg/dL — ABNORMAL LOW (ref 6–20)
CO2: 26 mmol/L (ref 22–32)
Calcium: 7.9 mg/dL — ABNORMAL LOW (ref 8.9–10.3)
Chloride: 109 mmol/L (ref 98–111)
Creatinine, Ser: 0.43 mg/dL — ABNORMAL LOW (ref 0.61–1.24)
GFR, Estimated: >60 mL/min (ref >60)
Glucose, Bld: 101 mg/dL — ABNORMAL HIGH (ref 70–99)
Potassium: 3.2 mmol/L — ABNORMAL LOW (ref 3.5–5.1)
Sodium: 144 mmol/L (ref 135–145)
Total Bilirubin: 3.3 mg/dL — ABNORMAL HIGH (ref 0.3–1.2)
Total Protein: 6 g/dL — ABNORMAL LOW (ref 6.5–8.1)

## 2023-01-08 LAB — CBC WITH DIFFERENTIAL/PLATELET
Abs Immature Granulocytes: 0.01 10*3/uL (ref 0.00–0.07)
Basophils Absolute: 0.1 10*3/uL (ref 0.0–0.1)
Basophils Relative: 1 %
Eosinophils Absolute: 0.1 10*3/uL (ref 0.0–0.5)
Eosinophils Relative: 1 %
HCT: 35.2 % — ABNORMAL LOW (ref 39.0–52.0)
Hemoglobin: 11.1 g/dL — ABNORMAL LOW (ref 13.0–17.0)
Immature Granulocytes: 0 %
Lymphocytes Relative: 57 %
Lymphs Abs: 2.5 10*3/uL (ref 0.7–4.0)
MCH: 29.5 pg (ref 26.0–34.0)
MCHC: 31.5 g/dL (ref 30.0–36.0)
MCV: 93.6 fL (ref 80.0–100.0)
Monocytes Absolute: 0.3 10*3/uL (ref 0.1–1.0)
Monocytes Relative: 6 %
Neutro Abs: 1.6 10*3/uL — ABNORMAL LOW (ref 1.7–7.7)
Neutrophils Relative %: 35 %
Platelets: 74 10*3/uL — ABNORMAL LOW (ref 150–400)
RBC: 3.76 MIL/uL — ABNORMAL LOW (ref 4.22–5.81)
RDW: 14.4 % (ref 11.5–15.5)
Smear Review: NORMAL
WBC: 4.4 10*3/uL (ref 4.0–10.5)
nRBC: 0 % (ref 0.0–0.2)

## 2023-01-08 LAB — ETHANOL: Alcohol, Ethyl (B): 423 mg/dL (ref <10)

## 2023-01-08 LAB — SALICYLATE LEVEL: Salicylate Lvl: <7 mg/dL — ABNORMAL LOW (ref 7.0–30.0)

## 2023-01-08 LAB — ACETAMINOPHEN LEVEL: Acetaminophen (Tylenol), Serum: <10 ug/mL — ABNORMAL LOW (ref 10–30)

## 2023-01-08 MED ORDER — LEVETIRACETAM 500 MG PO TABS
1000.0000 mg | ORAL_TABLET | Freq: Once | ORAL | Status: AC
  Administered 2023-01-08: 1000 mg via ORAL
  Filled 2023-01-08: qty 2

## 2023-01-08 NOTE — ED Provider Notes (Signed)
   [facility] Provider Note    Event Date/Time   First MD Initiated Contact with Patient 01/08/23 1813     (approximate)   History   Alcohol Intoxication   HPI  Brett Washington is a 42 year old male with history of alcohol abuse, seizures, cirrhosis presenting for evaluation of altered mental status.  Patient found on the side of the road.  During triage reported that his legs gave out.  Tells me that he laid down and thinks he may have had a seizure.  Unsure if he had a head injury.  Reports he takes some medication for it, unsure of the name.  Denies SI, HI.  In triage reported that he "sees his deceased grandma" denies hallucinations to me.  I reviewed his discharge summary from February of this year.  At that time, he was admitted with alcohol intoxication and seizure disorder unclear if associated with alcohol or primary seizure disorder.     Physical Exam   Triage Vital Signs: ED Triage Vitals  Encounter Vitals Group     BP 01/08/23 1813 118/82     Systolic BP Percentile --      Diastolic BP Percentile --      Pulse Rate 01/08/23 1813 77     Resp 01/08/23 1813 19     Temp 01/08/23 1813 97.8 F (36.6 C)     Temp src --      SpO2 01/08/23 1813 99 %     Weight --      Height --      Head Circumference --      Peak Flow --      Pain Score 01/08/23 1817 5     Pain Loc --      Pain Education --      Exclude from Growth Chart --     Most recent vital signs: Vitals:   01/08/23 1813  BP: 118/82  Pulse: 77  Resp: 19  Temp: 97.8 F (36.6 C)  SpO2: 99%     General: Awake, interactive  CV:  Regular rate, good peripheral perfusion.  Resp:  Lungs clear, unlabored respirations.  Abd:  Soft, nondistended.  Neuro:  Symmetric facial movement, fluid speech   ED Results / Procedures / Treatments   Labs (all labs ordered are listed, but only abnormal results are displayed) Labs Reviewed - No data to display   EKG EKG independently  reviewed interpreted by myself (ER attending) demonstrates:    RADIOLOGY Imaging independently reviewed and interpreted by myself demonstrates:    PROCEDURES:  Critical Care performed: {CriticalCareYesNo:19197::"Yes, see critical care procedure note(s)","No"}  Procedures   MEDICATIONS ORDERED IN ED: Medications - No data to display   IMPRESSION / MDM / ASSESSMENT AND PLAN / ED COURSE  I reviewed the triage vital signs and the nursing notes.  Differential diagnosis includes, but is not limited to, ***  Patient's presentation is most consistent with {EM COPA:27473}  ***      FINAL CLINICAL IMPRESSION(S) / ED DIAGNOSES   Final diagnoses:  None     Rx / DC Orders   ED Discharge Orders     None        Note:  This document was prepared using Dragon voice recognition software and may include unintentional dictation errors.

## 2023-01-08 NOTE — ED Notes (Signed)
Pt is asleep, RR and WOB WNL, NAD. No needs identified. Call light within reach, side rails up x2, bed locked and in the lowest position.

## 2023-01-08 NOTE — ED Triage Notes (Signed)
Pt brought in by [facility] with c/o alcohol intoxication. Pt was found on the side of the road. Pt reports his "legs gave out". Pt denies hitting head, but c/o right arm pain- unable to report if this pain is acute or chronic. Pt reports hx of seziures that he takes home medications for but is unsure of the name of the medication. Pt denies SI/HI. Pt reports he "sees deceased grandma", but only in dreams. Pt appears intoxicated at this time, but cooperative with staff.

## 2023-01-09 MED ORDER — POTASSIUM CHLORIDE CRYS ER 20 MEQ PO TBCR
40.0000 meq | EXTENDED_RELEASE_TABLET | Freq: Once | ORAL | Status: AC
  Administered 2023-01-09: 40 meq via ORAL
  Filled 2023-01-09: qty 2

## 2023-01-09 NOTE — ED Notes (Addendum)
Ambulatory, continent, alert and oriented. Called sister for ride home. Denies need for interpreter.

## 2023-01-09 NOTE — Discharge Instructions (Addendum)
Your recent in the ER today for evaluation after being found on the road.  Your alcohol level was very elevated.  It is possible you had a seizure, continue to take your medications as directed.  Return to the ER for new or worsening symptoms.

## 2023-01-10 ENCOUNTER — Other Ambulatory Visit: Payer: Self-pay

## 2023-01-10 ENCOUNTER — Emergency Department
Admission: EM | Admit: 2023-01-10 | Discharge: 2023-01-10 | Disposition: A | Discharge status: Home / Self Care | Payer: MEDICAID | Attending: Emergency Medicine | Admitting: Emergency Medicine

## 2023-01-10 ENCOUNTER — Emergency Department: Payer: MEDICAID

## 2023-01-10 DIAGNOSIS — R220 Localized swelling, mass and lump, head: Secondary | ICD-10-CM

## 2023-01-10 DIAGNOSIS — S0083XA Contusion of other part of head, initial encounter: Secondary | ICD-10-CM | POA: Diagnosis not present

## 2023-01-10 DIAGNOSIS — M79604 Pain in right leg: Secondary | ICD-10-CM | POA: Diagnosis not present

## 2023-01-10 DIAGNOSIS — F10120 Alcohol abuse with intoxication, uncomplicated: Secondary | ICD-10-CM | POA: Insufficient documentation

## 2023-01-10 DIAGNOSIS — F10920 Alcohol use, unspecified with intoxication, uncomplicated: Secondary | ICD-10-CM

## 2023-01-10 DIAGNOSIS — Y907 Blood alcohol level of 200-239 mg/100 ml: Secondary | ICD-10-CM | POA: Insufficient documentation

## 2023-01-10 DIAGNOSIS — R4182 Altered mental status, unspecified: Secondary | ICD-10-CM | POA: Diagnosis present

## 2023-01-10 DIAGNOSIS — X58XXXA Exposure to other specified factors, initial encounter: Secondary | ICD-10-CM | POA: Insufficient documentation

## 2023-01-10 DIAGNOSIS — M79605 Pain in left leg: Secondary | ICD-10-CM | POA: Diagnosis not present

## 2023-01-10 LAB — CBC WITH DIFFERENTIAL/PLATELET
Abs Immature Granulocytes: 0.02 10*3/uL (ref 0.00–0.07)
Basophils Absolute: 0 10*3/uL (ref 0.0–0.1)
Basophils Relative: 1 %
Eosinophils Absolute: 0 10*3/uL (ref 0.0–0.5)
Eosinophils Relative: 2 %
HCT: 34.8 % — ABNORMAL LOW (ref 39.0–52.0)
Hemoglobin: 11.2 g/dL — ABNORMAL LOW (ref 13.0–17.0)
Immature Granulocytes: 1 %
Lymphocytes Relative: 44 %
Lymphs Abs: 1.1 10*3/uL (ref 0.7–4.0)
MCH: 30 pg (ref 26.0–34.0)
MCHC: 32.2 g/dL (ref 30.0–36.0)
MCV: 93.3 fL (ref 80.0–100.0)
Monocytes Absolute: 0.2 10*3/uL (ref 0.1–1.0)
Monocytes Relative: 7 %
Neutro Abs: 1.1 10*3/uL — ABNORMAL LOW (ref 1.7–7.7)
Neutrophils Relative %: 45 %
Platelets: 70 10*3/uL — ABNORMAL LOW (ref 150–400)
RBC: 3.73 MIL/uL — ABNORMAL LOW (ref 4.22–5.81)
RDW: 14.5 % (ref 11.5–15.5)
Smear Review: DECREASED
WBC: 2.6 10*3/uL — ABNORMAL LOW (ref 4.0–10.5)
nRBC: 0 % (ref 0.0–0.2)

## 2023-01-10 LAB — COMPREHENSIVE METABOLIC PANEL WITH GFR
ALT: 47 U/L — ABNORMAL HIGH (ref 0–44)
AST: 88 U/L — ABNORMAL HIGH (ref 15–41)
Albumin: 2.6 g/dL — ABNORMAL LOW (ref 3.5–5.0)
Alkaline Phosphatase: 182 U/L — ABNORMAL HIGH (ref 38–126)
Anion gap: 13 (ref 5–15)
BUN: 7 mg/dL (ref 6–20)
CO2: 25 mmol/L (ref 22–32)
Calcium: 8.3 mg/dL — ABNORMAL LOW (ref 8.9–10.3)
Chloride: 106 mmol/L (ref 98–111)
Creatinine, Ser: 0.44 mg/dL — ABNORMAL LOW (ref 0.61–1.24)
GFR, Estimated: >60 mL/min
Glucose, Bld: 76 mg/dL (ref 70–99)
Potassium: 4.1 mmol/L (ref 3.5–5.1)
Sodium: 144 mmol/L (ref 135–145)
Total Bilirubin: 3.5 mg/dL — ABNORMAL HIGH (ref 0.3–1.2)
Total Protein: 6.3 g/dL — ABNORMAL LOW (ref 6.5–8.1)

## 2023-01-10 LAB — LEVETIRACETAM LEVEL: Levetiracetam Lvl: <2 ug/mL — ABNORMAL LOW (ref 10.0–40.0)

## 2023-01-10 LAB — ETHANOL: Alcohol, Ethyl (B): 234 mg/dL — ABNORMAL HIGH (ref <10)

## 2023-01-10 LAB — TROPONIN I (HIGH SENSITIVITY): Troponin I (High Sensitivity): 13 ng/L (ref <18)

## 2023-01-10 MED ORDER — ACETAMINOPHEN 500 MG PO TABS
1000.0000 mg | ORAL_TABLET | Freq: Once | ORAL | Status: AC
  Administered 2023-01-10: 1000 mg via ORAL
  Filled 2023-01-10: qty 2

## 2023-01-10 MED ORDER — CHLORDIAZEPOXIDE HCL 25 MG PO CAPS
25.0000 mg | ORAL_CAPSULE | Freq: Once | ORAL | Status: AC
  Administered 2023-01-10: 25 mg via ORAL
  Filled 2023-01-10: qty 1

## 2023-01-10 NOTE — ED Notes (Signed)
Called lab because labs were not "in process". Lab tech states "I will take care of it"

## 2023-01-10 NOTE — ED Provider Notes (Signed)
[facility] Provider Note   Event Date/Time   First MD Initiated Contact with Patient 01/10/23 [phone]     (approximate) History  Altered Mental Status  HPI Brett Washington is a 42 y.o. male with a past medical history of seizure disorder and alcohol abuse who presents via EMS after being found sleeping underneath a buildings eaves this morning with facial swelling on the right and complaints of bilateral lower extremity pain.  Patient states that he is outside after having a alcoholic drink after his emergency department visit yesterday.  Patient believes that he may have had a seizure ROS: Patient currently denies any vision changes, tinnitus, difficulty speaking, facial droop, sore throat, chest pain, shortness of breath, abdominal pain, nausea/vomiting/diarrhea, dysuria, or weakness/numbness/paresthesias in any extremity   Physical Exam  Triage Vital Signs: ED Triage Vitals  Encounter Vitals Group     BP      Systolic BP Percentile      Diastolic BP Percentile      Pulse      Resp      Temp      Temp src      SpO2      Weight      Height      Head Circumference      Peak Flow      Pain Score      Pain Loc      Pain Education      Exclude from Growth Chart    Most recent vital signs: Vitals:   01/10/23 1100 01/10/23 1130  BP: 99/62 103/67  Pulse: 73 69  Resp: 20 11  Temp:    SpO2: 100% 94%   General: Awake, oriented x4. CV:  Good peripheral perfusion.  Resp:  Normal effort.  Abd:  No distention.  Other:  Middle-aged well-developed, well-nourished Hispanic male resting comfortably in no acute distress.  Edema over right side of the face including right periorbital region, back arched, ED Results / Procedures / Treatments  Labs (all labs ordered are listed, but only abnormal results are displayed) Labs Reviewed  CBC WITH DIFFERENTIAL/PLATELET - Abnormal; Notable for the following components:      Result Value   WBC 2.6 (*)    RBC 3.73  (*)    Hemoglobin 11.2 (*)    HCT 34.8 (*)    Platelets 70 (*)    Neutro Abs 1.1 (*)    All other components within normal limits  COMPREHENSIVE METABOLIC PANEL - Abnormal; Notable for the following components:   Creatinine, Ser 0.44 (*)    Calcium 8.3 (*)    Total Protein 6.3 (*)    Albumin 2.6 (*)    AST 88 (*)    ALT 47 (*)    Alkaline Phosphatase 182 (*)    Total Bilirubin 3.5 (*)    All other components within normal limits  ETHANOL - Abnormal; Notable for the following components:   Alcohol, Ethyl (B) 234 (*)    All other components within normal limits  CBC WITH DIFFERENTIAL/PLATELET  TROPONIN I (HIGH SENSITIVITY)   RADIOLOGY ED MD interpretation: CT of the cervical spine interpreted by me does not show any evidence of acute abnormalities including no acute fracture, malalignment, height loss, or dislocation  CT of the head without contrast interpreted by me shows no evidence of acute abnormalities including no intracerebral hemorrhage, obvious masses, or significant edema  CT of the maxillofacial structures interpreted independently by me and shows significant  edema over the right face without any underlying fracture or dislocation -Agree with radiology assessment Official radiology report(s): CT Cervical Spine Wo Contrast  Result Date: 01/10/2023 CLINICAL DATA:  42 year old male with a history of seizures, found down outside. Right face swelling. Bilateral lower extremity pain. EXAM: CT CERVICAL SPINE WITHOUT CONTRAST TECHNIQUE: Multidetector CT imaging of the cervical spine was performed without intravenous contrast. Multiplanar CT image reconstructions were also generated. RADIATION DOSE REDUCTION: This exam was performed according to the departmental dose-optimization program which includes automated exposure control, adjustment of the mA and/or kV according to patient size and/or use of iterative reconstruction technique. COMPARISON:  CT head and Face today.  Cervical  spine CT 01/08/2023. FINDINGS: Alignment: Mild reversal of cervical lordosis now, recent straightening. Cervicothoracic junction alignment is within normal limits. Bilateral posterior element alignment is within normal limits. Skull base and vertebrae: Bone mineralization is within normal limits. Visualized skull base is intact. No atlanto-occipital dissociation. C1 and C2 appear intact and aligned. No acute osseous abnormality identified. Soft tissues and spinal canal: No prevertebral fluid or swelling. No visible canal hematoma. Negative visible noncontrast neck soft tissues. Disc levels:  Mild C5-C6 chronic disc and endplate degeneration. Upper chest: Visible upper thoracic levels appear intact. Negative left lung apex. There is partially visible chronic right apical lung and pleural scarring, thickening. This appears stable since last year 06/18/2021. IMPRESSION: 1. No acute traumatic injury identified in the cervical spine. 2. Chronic right apical lung and pleural scarring. Electronically Signed   By: Odessa Fleming M.D.   On: 01/10/2023 10:44   CT Maxillofacial WO CM  Result Date: 01/10/2023 CLINICAL DATA:  42 year old male with a history of seizures, found down outside. Right face swelling. Bilateral lower extremity pain. EXAM: CT MAXILLOFACIAL WITHOUT CONTRAST TECHNIQUE: Multidetector CT imaging of the maxillofacial structures was performed. Multiplanar CT image reconstructions were also generated. RADIATION DOSE REDUCTION: This exam was performed according to the departmental dose-optimization program which includes automated exposure control, adjustment of the mA and/or kV according to patient size and/or use of iterative reconstruction technique. COMPARISON:  Face CT 05/19/2021.  Head and cervical spine CT today. FINDINGS: Osseous: Healed left mandible coronoid process fracture since last year. Elsewhere the mandible appears intact and aligned. No acute dental finding. Bilateral maxilla, zygoma, pterygoid,  and nasal bones appear intact. Orbits: No orbital wall fracture. Globes and intraorbital soft tissues appear negative. There is right side preseptal soft tissue swelling, including inferior to the orbit premalar space. But the epicenter is along the right lateral orbit above the zygoma. No soft tissue gas. Sinuses: Paranasal sinuses are stable and well aerated. Tympanic cavities and mastoids appear clear. Soft tissues: Aside from the right periorbital, face superficial soft tissue swelling above, negative visible noncontrast larynx, pharynx, parapharyngeal spaces, retropharyngeal space, sublingual space, submandibular spaces, masticator and parotid spaces. Visible cervical lymph nodes appear to remain normal. Limited intracranial: Stable to that reported separately. IMPRESSION: 1. Moderate to severe right scalp and lateral face soft tissue swelling, including right preseptal/periorbital involvement. This is nonspecific with top differential considerations include cellulitis and injury. No soft tissue gas. 2. No acute facial fracture. Chronic left mandible coronoid fracture. Electronically Signed   By: Odessa Fleming M.D.   On: 01/10/2023 10:41   CT Head Wo Contrast  Result Date: 01/10/2023 CLINICAL DATA:  42 year old male with a history of seizures, found down outside. Right face swelling. Bilateral lower extremity pain. EXAM: CT HEAD WITHOUT CONTRAST TECHNIQUE: Contiguous axial images were obtained from the  base of the skull through the vertex without intravenous contrast. RADIATION DOSE REDUCTION: This exam was performed according to the departmental dose-optimization program which includes automated exposure control, adjustment of the mA and/or kV according to patient size and/or use of iterative reconstruction technique. COMPARISON:  Face and cervical spine CT today.  Head CT 01/08/2023. FINDINGS: Brain: Stable cerebral volume, lower limits of normal for age. No midline shift, ventriculomegaly, mass effect,  evidence of mass lesion, intracranial hemorrhage or evidence of cortically based acute infarction. Gray-white matter differentiation is within normal limits throughout the brain. Vascular: No suspicious intracranial vascular hyperdensity. Skull: No acute osseous abnormality identified. Sinuses/Orbits: Visualized paranasal sinuses and mastoids are stable and well aerated. Other: Moderate to severe widespread right scalp edema and/or hematoma, extending from the vertex through to the visible right face, worst lateral to the right orbit. But the visible right globe and intraorbital soft tissues appear to remain normal. No scalp soft tissue gas. IMPRESSION: 1. Extensive, severe right side scalp and visible face soft tissue swelling which could be edema or hematoma. Right orbit appears spared. No acute osseous abnormality identified. see also Face CT. 2. Stable and negative noncontrast CT appearance of the brain. Electronically Signed   By: Odessa Fleming M.D.   On: 01/10/2023 10:36   PROCEDURES: Critical Care performed: No .1-3 Lead EKG Interpretation  Performed by: Merwyn Katos, MD Authorized by: Merwyn Katos, MD     Interpretation: normal     ECG rate:  71   ECG rate assessment: normal     Rhythm: sinus rhythm     Ectopy: none     Conduction: normal    MEDICATIONS ORDERED IN ED: Medications  acetaminophen (TYLENOL) tablet 1,000 mg (1,000 mg Oral Given 01/10/23 0916)  chlordiazePOXIDE (LIBRIUM) capsule 25 mg (25 mg Oral Given 01/10/23 0916)   IMPRESSION / MDM / ASSESSMENT AND PLAN / ED COURSE  I reviewed the triage vital signs and the nursing notes.                             The patient is on the cardiac monitor to evaluate for evidence of arrhythmia and/or significant heart rate changes. Patient's presentation is most consistent with acute presentation with potential threat to life or bodily function. Patient presents after recent seizure episode.  Patient had slow return to baseline mental  and physical function per bystanders. No immunosuppresion hx and had no preceding fever. No history of alcohol abuse or suspicion for toxin ingestion. Unlikely stroke, syncope. Unlikely infectious etiology. No preceding trauma.  Workup: EKG, BMP, POCT glucose (pregnancy test if male) and CT Brain.  Field Interventions: None ED Interventions: None CT of the head/neck/maxillofacial structures did not show any evidence of acute abnormalities Patient p.o. tolerant and ambulatory without difficulty Disposition: Discharge home with primary care follow up in next 24-48 hours.   FINAL CLINICAL IMPRESSION(S) / ED DIAGNOSES   Final diagnoses:  Alcoholic intoxication without complication (HCC)  Right facial swelling  Contusion of face, initial encounter   Rx / DC Orders   ED Discharge Orders     None      Note:  This document was prepared using Dragon voice recognition software and may include unintentional dictation errors.   Merwyn Katos, MD 01/10/23 [phone]

## 2023-01-10 NOTE — ED Notes (Signed)
Phlebotomy at bedside.

## 2023-01-10 NOTE — ED Triage Notes (Signed)
Pt bib [facility]. Pt was found sleeping outside of [facility]. Pt was admitted and discharged at this facility yesterday due to seizures and alcohol. Pt presents with Right side facial swelling and cx of bilateral leg pain.  B/p 131/74 HR 90

## 2024-04-22 ENCOUNTER — Emergency Department: Payer: MEDICAID

## 2024-04-22 ENCOUNTER — Emergency Department
Admission: EM | Admit: 2024-04-22 | Discharge: 2024-04-22 | Disposition: A | Discharge status: Home / Self Care | Payer: MEDICAID | Attending: Emergency Medicine | Admitting: Emergency Medicine

## 2024-04-22 ENCOUNTER — Other Ambulatory Visit: Payer: Self-pay

## 2024-04-22 DIAGNOSIS — S82831A Other fracture of upper and lower end of right fibula, initial encounter for closed fracture: Secondary | ICD-10-CM | POA: Diagnosis not present

## 2024-04-22 DIAGNOSIS — S99911A Unspecified injury of right ankle, initial encounter: Secondary | ICD-10-CM | POA: Diagnosis present

## 2024-04-22 DIAGNOSIS — Y92830 Public park as the place of occurrence of the external cause: Secondary | ICD-10-CM | POA: Insufficient documentation

## 2024-04-22 DIAGNOSIS — X509XXA Other and unspecified overexertion or strenuous movements or postures, initial encounter: Secondary | ICD-10-CM | POA: Insufficient documentation

## 2024-04-22 DIAGNOSIS — Y908 Blood alcohol level of 240 mg/100 ml or more: Secondary | ICD-10-CM | POA: Diagnosis not present

## 2024-04-22 DIAGNOSIS — F1090 Alcohol use, unspecified, uncomplicated: Secondary | ICD-10-CM | POA: Diagnosis not present

## 2024-04-22 DIAGNOSIS — S82891A Other fracture of right lower leg, initial encounter for closed fracture: Secondary | ICD-10-CM

## 2024-04-22 DIAGNOSIS — R7989 Other specified abnormal findings of blood chemistry: Secondary | ICD-10-CM | POA: Diagnosis not present

## 2024-04-22 DIAGNOSIS — Y9366 Activity, soccer: Secondary | ICD-10-CM | POA: Diagnosis not present

## 2024-04-22 LAB — CBC
HCT: 39.4 % (ref 39.0–52.0)
Hemoglobin: 13.5 g/dL (ref 13.0–17.0)
MCH: 32.5 pg (ref 26.0–34.0)
MCHC: 34.3 g/dL (ref 30.0–36.0)
MCV: 94.9 fL (ref 80.0–100.0)
Platelets: 83 K/uL — ABNORMAL LOW (ref 150–400)
RBC: 4.15 MIL/uL — ABNORMAL LOW (ref 4.22–5.81)
RDW: 15.1 % (ref 11.5–15.5)
WBC: 3.9 K/uL — ABNORMAL LOW (ref 4.0–10.5)
nRBC: 0 % (ref 0.0–0.2)

## 2024-04-22 LAB — COMPREHENSIVE METABOLIC PANEL WITH GFR
ALT: 68 U/L — ABNORMAL HIGH (ref 0–44)
AST: 125 U/L — ABNORMAL HIGH (ref 15–41)
Albumin: 3.5 g/dL (ref 3.5–5.0)
Alkaline Phosphatase: 223 U/L — ABNORMAL HIGH (ref 38–126)
Anion gap: 10 (ref 5–15)
BUN: <5 mg/dL — ABNORMAL LOW (ref 6–20)
CO2: 23 mmol/L (ref 22–32)
Calcium: 8.5 mg/dL — ABNORMAL LOW (ref 8.9–10.3)
Chloride: 109 mmol/L (ref 98–111)
Creatinine, Ser: 0.5 mg/dL — ABNORMAL LOW (ref 0.61–1.24)
GFR, Estimated: >60 mL/min
Glucose, Bld: 107 mg/dL — ABNORMAL HIGH (ref 70–99)
Potassium: 3.6 mmol/L (ref 3.5–5.1)
Sodium: 142 mmol/L (ref 135–145)
Total Bilirubin: 2.4 mg/dL — ABNORMAL HIGH (ref 0.0–1.2)
Total Protein: 7.5 g/dL (ref 6.5–8.1)

## 2024-04-22 LAB — LIPASE, BLOOD: Lipase: 28 U/L (ref 11–51)

## 2024-04-22 LAB — ETHANOL: Alcohol, Ethyl (B): 442 mg/dL

## 2024-04-22 NOTE — ED Provider Notes (Addendum)
 "  [facility] Provider Note    Event Date/Time   First MD Initiated Contact with Patient 04/22/24 1519     (approximate)  History   Chief Complaint: Foot Injury  HPI  Brett Washington is a 45 y.o. male with a past medical history of alcohol abuse, cirrhosis, presents to the emergency department for right ankle pain.  According to the patient earlier this morning he was messing around playing soccer with his kids and rolled his right ankle.  Patient states pain and swelling to the ankle so he came this afternoon for evaluation.  Patient does admit to alcohol use today as well.  Physical Exam   Triage Vital Signs: ED Triage Vitals  Encounter Vitals Group     BP 04/22/24 1417 (!) 136/94     Girls Systolic BP Percentile --      Girls Diastolic BP Percentile --      Boys Systolic BP Percentile --      Boys Diastolic BP Percentile --      Pulse Rate 04/22/24 1417 69     Resp 04/22/24 1417 19     Temp 04/22/24 1417 98 F (36.7 C)     Temp src --      SpO2 04/22/24 1417 96 %     Weight 04/22/24 1419 205 lb (93 kg)     Height 04/22/24 1419 5' 9 (1.753 m)     Head Circumference --      Peak Flow --      Pain Score 04/22/24 1416 10     Pain Loc --      Pain Education --      Exclude from Growth Chart --     Most recent vital signs: Vitals:   04/22/24 1417  BP: (!) 136/94  Pulse: 69  Resp: 19  Temp: 98 F (36.7 C)  SpO2: 96%    General: Awake, no distress.  CV:  Good peripheral perfusion.  Regular rate and rhythm  Resp:  Normal effort.  Equal breath sounds bilaterally.  Abd:  No distention.  Soft, nontender. Other:  Patient has swelling and ecchymosis of the right ankle mostly to the lateral malleolus.  Neurovascularly intact.   ED Results / Procedures / Treatments   RADIOLOGY  I have reviewed interpreted the foot x-ray images appears to have a distal fibula fracture.  Will obtain dedicated ankle x-rays.   MEDICATIONS ORDERED IN  ED: Medications - No data to display   IMPRESSION / MDM / ASSESSMENT AND PLAN / ED COURSE  I reviewed the triage vital signs and the nursing notes.  Patient's presentation is most consistent with acute presentation with potential threat to life or bodily function.  Patient presents the emergency department for right ankle pain.  States he rolled his right ankle while playing with his kids earlier today.  Patient appears to have a distal fibula fracture on x-ray images.  Awaiting radiology read for verification.  Lab work shows a reassuring CBC chemistry shows LFT elevation which appears chronic, lipase is normal.  Alcohol level is elevated 442.  Patient does admit to chronic alcohol use.  We will place the patient in a cam boot, crutches with minimal weightbearing and have the patient follow-up with orthopedics.  Patient agreeable to plan of care.  Patient will call for a ride given his alcohol use.  Patient's ankle x-ray shows mildly displaced distal fibula fracture no other findings.  Will place in a cam boot with  crutches.  Will discharge with orthopedic follow-up.  Patient is attempting to arrange a ride home.  Patient was unable to get a ride home.  Currently awaiting a ride versus clinical sobriety so that the patient can take a [place] or taxi  It is now been over 6 hours in the emergency department.  Patient continues to appear well.  Clinically appears sober.  Will allow patient to leave as long as he can arrange a safe ride such as a taxi or [place].  Patient agreeable.  FINAL CLINICAL IMPRESSION(S) / ED DIAGNOSES   Right ankle fracture/distal fibula fracture Alcohol use   Note:  This document was prepared using Dragon voice recognition software and may include unintentional dictation errors.   Dorothyann Drivers, MD 04/22/24 1647    Dorothyann Drivers, MD 04/22/24 2037  "

## 2024-04-22 NOTE — ED Triage Notes (Addendum)
 Pt comes with right foot injury. Pt states he was with sons playing soccer and he injured it. Pt appears intoxicated. Pt states he only had one drink today while taking his bp pills. Pt states vomiting and belly pain. Pt has cirrhosis of liver. Pt slurring words some.

## 2024-04-22 NOTE — ED Notes (Signed)
 Pt unable to get ride. Will stay to sober up in order to get cab later

## 2024-04-22 NOTE — Discharge Instructions (Signed)
 Please wear your fracture boot.  Please use crutches to take weight off of your right ankle.  You may take pain medication if needed for significant pain.  As we discussed it is very important that you do not drink alcohol while taking pain medication as this could cause significant respiratory depression/trouble breathing and could potentially result in death. Please call the number provided for orthopedics to arrange a follow-up appointment in approximately 1 week for recheck/reevaluation.

## 2024-04-22 NOTE — ED Notes (Signed)
 First Nurse Note: [place] [facility] [place]. Pt c/o possible broken foot. + ETOH.

## 2024-05-01 ENCOUNTER — Emergency Department: Admission: EM | Admit: 2024-05-01 | Discharge: 2024-05-02 | Disposition: A | Payer: MEDICAID

## 2024-05-01 ENCOUNTER — Other Ambulatory Visit: Payer: Self-pay

## 2024-05-01 ENCOUNTER — Emergency Department: Payer: MEDICAID

## 2024-05-01 DIAGNOSIS — Y908 Blood alcohol level of 240 mg/100 ml or more: Secondary | ICD-10-CM | POA: Insufficient documentation

## 2024-05-01 DIAGNOSIS — S82891A Other fracture of right lower leg, initial encounter for closed fracture: Secondary | ICD-10-CM | POA: Insufficient documentation

## 2024-05-01 DIAGNOSIS — F10120 Alcohol abuse with intoxication, uncomplicated: Secondary | ICD-10-CM | POA: Insufficient documentation

## 2024-05-01 DIAGNOSIS — T68XXXA Hypothermia, initial encounter: Secondary | ICD-10-CM | POA: Diagnosis not present

## 2024-05-01 DIAGNOSIS — F10920 Alcohol use, unspecified with intoxication, uncomplicated: Secondary | ICD-10-CM

## 2024-05-01 DIAGNOSIS — X58XXXA Exposure to other specified factors, initial encounter: Secondary | ICD-10-CM | POA: Diagnosis not present

## 2024-05-01 DIAGNOSIS — S99911A Unspecified injury of right ankle, initial encounter: Secondary | ICD-10-CM | POA: Diagnosis present

## 2024-05-01 LAB — CBC WITH DIFFERENTIAL/PLATELET
Abs Immature Granulocytes: 0.01 K/uL (ref 0.00–0.07)
Basophils Absolute: 0 K/uL (ref 0.0–0.1)
Basophils Relative: 1 %
Eosinophils Absolute: 0 K/uL (ref 0.0–0.5)
Eosinophils Relative: 0 %
HCT: 47 % (ref 39.0–52.0)
Hemoglobin: 15.7 g/dL (ref 13.0–17.0)
Immature Granulocytes: 0 %
Lymphocytes Relative: 44 %
Lymphs Abs: 1.7 K/uL (ref 0.7–4.0)
MCH: 31.7 pg (ref 26.0–34.0)
MCHC: 33.4 g/dL (ref 30.0–36.0)
MCV: 94.9 fL (ref 80.0–100.0)
Monocytes Absolute: 0.2 K/uL (ref 0.1–1.0)
Monocytes Relative: 5 %
Neutro Abs: 1.9 K/uL (ref 1.7–7.7)
Neutrophils Relative %: 50 %
Platelets: 77 K/uL — ABNORMAL LOW (ref 150–400)
RBC: 4.95 MIL/uL (ref 4.22–5.81)
RDW: 14.8 % (ref 11.5–15.5)
WBC: 3.9 K/uL — ABNORMAL LOW (ref 4.0–10.5)
nRBC: 0 % (ref 0.0–0.2)

## 2024-05-01 LAB — COMPREHENSIVE METABOLIC PANEL WITH GFR
ALT: 57 U/L — ABNORMAL HIGH (ref 0–44)
AST: 118 U/L — ABNORMAL HIGH (ref 15–41)
Albumin: 4.2 g/dL (ref 3.5–5.0)
Alkaline Phosphatase: 255 U/L — ABNORMAL HIGH (ref 38–126)
Anion gap: 15 (ref 5–15)
BUN: <5 mg/dL — ABNORMAL LOW (ref 6–20)
CO2: 24 mmol/L (ref 22–32)
Calcium: 8.8 mg/dL — ABNORMAL LOW (ref 8.9–10.3)
Chloride: 106 mmol/L (ref 98–111)
Creatinine, Ser: 0.49 mg/dL — ABNORMAL LOW (ref 0.61–1.24)
GFR, Estimated: >60 mL/min
Glucose, Bld: 98 mg/dL (ref 70–99)
Potassium: 4 mmol/L (ref 3.5–5.1)
Sodium: 145 mmol/L (ref 135–145)
Total Bilirubin: 2.3 mg/dL — ABNORMAL HIGH (ref 0.0–1.2)
Total Protein: 9 g/dL — ABNORMAL HIGH (ref 6.5–8.1)

## 2024-05-01 LAB — ETHANOL: Alcohol, Ethyl (B): >450 mg/dL

## 2024-05-01 LAB — PROTIME-INR
INR: 1.4 — ABNORMAL HIGH (ref 0.8–1.2)
Prothrombin Time: 17.5 s — ABNORMAL HIGH (ref 11.4–15.2)

## 2024-05-01 LAB — LACTIC ACID, PLASMA: Lactic Acid, Venous: 2.5 mmol/L (ref 0.5–1.9)

## 2024-05-01 LAB — AMMONIA: Ammonia: 82 umol/L — ABNORMAL HIGH (ref 9–35)

## 2024-05-01 LAB — LIPASE, BLOOD: Lipase: 80 U/L — ABNORMAL HIGH (ref 11–51)

## 2024-05-01 MED ORDER — THIAMINE HCL 100 MG/ML IJ SOLN
100.0000 mg | Freq: Once | INTRAMUSCULAR | Status: AC
  Administered 2024-05-01: 100 mg via INTRAVENOUS
  Filled 2024-05-01: qty 2

## 2024-05-01 MED ORDER — SODIUM CHLORIDE 0.9 % IV BOLUS
1000.0000 mL | Freq: Once | INTRAVENOUS | Status: AC
  Administered 2024-05-01: 1000 mL via INTRAVENOUS

## 2024-05-01 NOTE — ED Notes (Signed)
 Requested lab to draw pt 2nd set of cultures.  Primary RN Redell notified.
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
## 2024-05-02 NOTE — ED Notes (Signed)
 Pt ambulatory to toilet in bathroom without assistance
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
## 2024-05-02 NOTE — ED Provider Notes (Signed)
 Patient achieved clinical sobriety.  Up and walking, bearing weight on his right leg.  Tolerated p.o. discharged with cam walking boot, crutches and follow-up information for podiatry.  Discussed ED return precautions   Claudene Rover, MD 05/02/24 [phone]

## 2024-05-06 LAB — CULTURE, BLOOD (ROUTINE X 2)
Culture: NO GROWTH
Culture: NO GROWTH
